# Patient Record
Sex: Female | Born: 1965 | Race: White | Hispanic: No | Marital: Single | Smoking: Former smoker
Health system: Southern US, Community
[De-identification: ages and names within clinical notes are randomized; demographics above are authoritative.]

## PROBLEM LIST (undated history)

## (undated) DIAGNOSIS — A059 Bacterial foodborne intoxication, unspecified: Secondary | ICD-10-CM

## (undated) DIAGNOSIS — F329 Major depressive disorder, single episode, unspecified: Secondary | ICD-10-CM

## (undated) DIAGNOSIS — Z8489 Family history of other specified conditions: Secondary | ICD-10-CM

## (undated) DIAGNOSIS — G709 Myoneural disorder, unspecified: Secondary | ICD-10-CM

## (undated) DIAGNOSIS — F32A Depression, unspecified: Secondary | ICD-10-CM

## (undated) DIAGNOSIS — Z9989 Dependence on other enabling machines and devices: Secondary | ICD-10-CM

## (undated) DIAGNOSIS — K449 Diaphragmatic hernia without obstruction or gangrene: Secondary | ICD-10-CM

## (undated) DIAGNOSIS — M1990 Unspecified osteoarthritis, unspecified site: Secondary | ICD-10-CM

## (undated) DIAGNOSIS — R51 Headache: Secondary | ICD-10-CM

## (undated) DIAGNOSIS — G8929 Other chronic pain: Secondary | ICD-10-CM

## (undated) DIAGNOSIS — I10 Essential (primary) hypertension: Secondary | ICD-10-CM

## (undated) DIAGNOSIS — M810 Age-related osteoporosis without current pathological fracture: Secondary | ICD-10-CM

## (undated) DIAGNOSIS — K219 Gastro-esophageal reflux disease without esophagitis: Secondary | ICD-10-CM

## (undated) DIAGNOSIS — F419 Anxiety disorder, unspecified: Secondary | ICD-10-CM

## (undated) DIAGNOSIS — G894 Chronic pain syndrome: Secondary | ICD-10-CM

## (undated) DIAGNOSIS — H547 Unspecified visual loss: Secondary | ICD-10-CM

## (undated) DIAGNOSIS — IMO0001 Reserved for inherently not codable concepts without codable children: Secondary | ICD-10-CM

## (undated) DIAGNOSIS — R519 Headache, unspecified: Secondary | ICD-10-CM

## (undated) DIAGNOSIS — G43909 Migraine, unspecified, not intractable, without status migrainosus: Secondary | ICD-10-CM

## (undated) DIAGNOSIS — G4730 Sleep apnea, unspecified: Secondary | ICD-10-CM

## (undated) DIAGNOSIS — J45909 Unspecified asthma, uncomplicated: Secondary | ICD-10-CM

## (undated) DIAGNOSIS — H9190 Unspecified hearing loss, unspecified ear: Secondary | ICD-10-CM

## (undated) DIAGNOSIS — M169 Osteoarthritis of hip, unspecified: Secondary | ICD-10-CM

## (undated) HISTORY — DX: Unspecified hearing loss, unspecified ear: H91.90

## (undated) HISTORY — DX: Unspecified visual loss: H54.7

## (undated) HISTORY — DX: Migraine, unspecified, not intractable, without status migrainosus: G43.909

## (undated) HISTORY — DX: Other chronic pain: G89.29

## (undated) HISTORY — DX: Age-related osteoporosis without current pathological fracture: M81.0

## (undated) HISTORY — PX: DILATION AND CURETTAGE OF UTERUS: SHX78

## (undated) HISTORY — DX: Gastro-esophageal reflux disease without esophagitis: K21.9

## (undated) HISTORY — PX: INNER EAR SURGERY: SHX679

## (undated) HISTORY — DX: Chronic pain syndrome: G89.4

## (undated) HISTORY — DX: Dependence on other enabling machines and devices: Z99.89

## (undated) HISTORY — DX: Diaphragmatic hernia without obstruction or gangrene: K44.9

## (undated) HISTORY — DX: Bacterial foodborne intoxication, unspecified: A05.9

## (undated) HISTORY — DX: Osteoarthritis of hip, unspecified: M16.9

## (undated) HISTORY — DX: Reserved for inherently not codable concepts without codable children: IMO0001

---

## 2011-12-12 ENCOUNTER — Emergency Department: Payer: Self-pay | Admitting: Emergency Medicine

## 2012-03-18 ENCOUNTER — Emergency Department: Payer: Self-pay | Admitting: Emergency Medicine

## 2012-03-18 LAB — COMPREHENSIVE METABOLIC PANEL
Alkaline Phosphatase: 86 U/L (ref 50–136)
Chloride: 106 mmol/L (ref 98–107)
Co2: 27 mmol/L (ref 21–32)
Creatinine: 0.86 mg/dL (ref 0.60–1.30)
Glucose: 101 mg/dL — ABNORMAL HIGH (ref 65–99)
SGPT (ALT): 30 U/L (ref 12–78)
Total Protein: 7 g/dL (ref 6.4–8.2)

## 2012-03-18 LAB — URINALYSIS, COMPLETE
Bilirubin,UR: NEGATIVE
Ketone: NEGATIVE
Ph: 8 (ref 4.5–8.0)
Protein: 100
Specific Gravity: 1.016 (ref 1.003–1.030)
Squamous Epithelial: 5

## 2012-03-18 LAB — CBC
MCHC: 34 g/dL (ref 32.0–36.0)
MCV: 84 fL (ref 80–100)
RBC: 4.63 10*6/uL (ref 3.80–5.20)
RDW: 15.1 % — ABNORMAL HIGH (ref 11.5–14.5)
WBC: 6.3 10*3/uL (ref 3.6–11.0)

## 2012-03-18 LAB — PREGNANCY, URINE: Pregnancy Test, Urine: NEGATIVE m[IU]/mL

## 2012-06-18 ENCOUNTER — Emergency Department: Payer: Self-pay | Admitting: Emergency Medicine

## 2012-07-20 DIAGNOSIS — J45909 Unspecified asthma, uncomplicated: Secondary | ICD-10-CM | POA: Insufficient documentation

## 2012-08-29 DIAGNOSIS — M2550 Pain in unspecified joint: Secondary | ICD-10-CM | POA: Insufficient documentation

## 2012-09-15 ENCOUNTER — Ambulatory Visit: Payer: Self-pay | Admitting: Family Medicine

## 2013-03-25 ENCOUNTER — Ambulatory Visit: Payer: Self-pay | Admitting: Family Medicine

## 2013-03-30 ENCOUNTER — Ambulatory Visit: Payer: Self-pay | Admitting: Family Medicine

## 2013-09-08 DIAGNOSIS — F329 Major depressive disorder, single episode, unspecified: Secondary | ICD-10-CM | POA: Insufficient documentation

## 2013-09-08 DIAGNOSIS — F419 Anxiety disorder, unspecified: Secondary | ICD-10-CM | POA: Insufficient documentation

## 2013-09-14 ENCOUNTER — Ambulatory Visit: Payer: Self-pay | Admitting: Gastroenterology

## 2013-09-16 LAB — PATHOLOGY REPORT

## 2013-11-09 DIAGNOSIS — D126 Benign neoplasm of colon, unspecified: Secondary | ICD-10-CM | POA: Insufficient documentation

## 2013-12-06 ENCOUNTER — Ambulatory Visit: Payer: Self-pay | Admitting: Family Medicine

## 2014-01-31 ENCOUNTER — Ambulatory Visit: Payer: Self-pay | Admitting: Gastroenterology

## 2014-06-01 ENCOUNTER — Ambulatory Visit: Payer: Self-pay | Admitting: Nurse Practitioner

## 2014-06-30 ENCOUNTER — Ambulatory Visit: Payer: Self-pay | Admitting: Nurse Practitioner

## 2014-07-27 DIAGNOSIS — H9190 Unspecified hearing loss, unspecified ear: Secondary | ICD-10-CM | POA: Insufficient documentation

## 2014-10-11 ENCOUNTER — Other Ambulatory Visit: Payer: Self-pay | Admitting: Otolaryngology

## 2014-10-11 DIAGNOSIS — E079 Disorder of thyroid, unspecified: Secondary | ICD-10-CM

## 2014-10-14 ENCOUNTER — Ambulatory Visit
Admission: RE | Admit: 2014-10-14 | Discharge: 2014-10-14 | Disposition: A | Discharge status: Home / Self Care | Payer: Medicare Other | Source: Ambulatory Visit | Attending: Otolaryngology | Admitting: Otolaryngology

## 2014-10-14 DIAGNOSIS — E079 Disorder of thyroid, unspecified: Secondary | ICD-10-CM

## 2014-10-14 DIAGNOSIS — E042 Nontoxic multinodular goiter: Secondary | ICD-10-CM | POA: Diagnosis present

## 2014-10-21 ENCOUNTER — Other Ambulatory Visit: Payer: Self-pay | Admitting: Otolaryngology

## 2014-10-21 DIAGNOSIS — J320 Chronic maxillary sinusitis: Secondary | ICD-10-CM

## 2014-10-21 DIAGNOSIS — R42 Dizziness and giddiness: Secondary | ICD-10-CM

## 2014-10-21 DIAGNOSIS — E041 Nontoxic single thyroid nodule: Secondary | ICD-10-CM

## 2014-10-28 ENCOUNTER — Ambulatory Visit: Admission: RE | Admit: 2014-10-28 | Payer: Medicare Other | Source: Ambulatory Visit

## 2014-11-02 ENCOUNTER — Other Ambulatory Visit: Payer: Self-pay | Admitting: Radiology

## 2014-11-03 ENCOUNTER — Ambulatory Visit
Admission: RE | Admit: 2014-11-03 | Discharge: 2014-11-03 | Disposition: A | Discharge status: Home / Self Care | Payer: Medicare Other | Source: Ambulatory Visit | Attending: Otolaryngology | Admitting: Otolaryngology

## 2014-11-03 DIAGNOSIS — J320 Chronic maxillary sinusitis: Secondary | ICD-10-CM

## 2014-11-03 DIAGNOSIS — R42 Dizziness and giddiness: Secondary | ICD-10-CM

## 2014-11-03 DIAGNOSIS — R51 Headache: Secondary | ICD-10-CM | POA: Diagnosis present

## 2014-11-03 DIAGNOSIS — E041 Nontoxic single thyroid nodule: Secondary | ICD-10-CM

## 2014-11-03 HISTORY — DX: Essential (primary) hypertension: I10

## 2014-11-03 HISTORY — DX: Unspecified asthma, uncomplicated: J45.909

## 2014-11-03 HISTORY — DX: Headache: R51

## 2014-11-03 HISTORY — DX: Unspecified osteoarthritis, unspecified site: M19.90

## 2014-11-03 HISTORY — DX: Sleep apnea, unspecified: G47.30

## 2014-11-03 HISTORY — DX: Anxiety disorder, unspecified: F41.9

## 2014-11-03 HISTORY — DX: Headache, unspecified: R51.9

## 2014-11-03 HISTORY — DX: Reserved for inherently not codable concepts without codable children: IMO0001

## 2014-11-03 HISTORY — DX: Depression, unspecified: F32.A

## 2014-11-03 HISTORY — DX: Major depressive disorder, single episode, unspecified: F32.9

## 2014-11-03 HISTORY — DX: Gastro-esophageal reflux disease without esophagitis: K21.9

## 2014-11-03 HISTORY — DX: Myoneural disorder, unspecified: G70.9

## 2014-11-03 MED ORDER — GADOBENATE DIMEGLUMINE 529 MG/ML IV SOLN
20.0000 mL | Freq: Once | INTRAVENOUS | Status: AC | PRN
  Administered 2014-11-03: 20 mL via INTRAVENOUS

## 2014-11-03 NOTE — Procedures (Signed)
Procedure:  Ultrasound guided left thyroid biopsy Findings:  2 cm left thyroid nodule FNA biopsy.  25 G needle passes x 6. No complications. EBL < 10 mL  Genavieve Mangiapane T. Kathlene Cote, M.D Pager:  [phone]

## 2014-11-03 NOTE — Discharge Instructions (Addendum)
°Thyroid Biopsy °The thyroid gland is a butterfly-shaped gland situated in the front of the neck. It produces hormones which affect metabolism, growth and development, and body temperature. A thyroid biopsy is a procedure in which small samples of tissue or fluid are removed from the thyroid gland or mass and examined under a microscope. This test is done to determine the cause of thyroid problems, such as infection, cancer, or other thyroid problems. °There are 2 ways to obtain samples: °1. Fine needle biopsy. Samples are removed using a thin needle inserted through the skin and into the thyroid gland or mass. °2. Open biopsy. Samples are removed after a cut (incision) is made through the skin. °LET YOUR CAREGIVER KNOW ABOUT:  °· Allergies. °· Medications taken including herbs, eye drops, over-the-counter medications, and creams. °· Use of steroids (by mouth or creams). °· Previous problems with anesthetics or numbing medicine. °· Possibility of pregnancy, if this applies. °· History of blood clots (thrombophlebitis). °· History of bleeding or blood problems. °· Previous surgery. °· Other health problems. °RISKS AND COMPLICATIONS °· Bleeding from the site. The risk of bleeding is higher if you have a bleeding disorder or are taking any blood thinning medications (anticoagulants). °· Infection. °· Injury to structures near the thyroid gland. °BEFORE THE PROCEDURE  °This is a procedure that can be done as an outpatient. Confirm the time that you need to arrive for your procedure. Confirm whether there is a need to fast or withhold any medications. A blood sample may be done to determine your blood clotting time. Medicine may be given to help you relax (sedative). °PROCEDURE °Fine needle biopsy. °You will be awake during the procedure. You may be asked to lie on your back with your head tipped backward to extend your neck. Let your caregiver know if you cannot tolerate the positioning. An area on your neck will be  cleansed. A needle is inserted through the skin of your neck. You may feel a mild discomfort during this procedure. You may be asked to avoid coughing, talking, swallowing, or making sounds during some portions of the procedure. The needle is withdrawn once tissue or fluid samples have been removed. Pressure may be applied to the neck to reduce swelling and ensure that bleeding has stopped. The samples will be sent for examination.  °Open biopsy. °You will be given general anesthesia. You will be asleep during the procedure. An incision is made in your neck. A sample of thyroid tissue or the mass is removed. The tissue sample or mass will be sent for examination. The sample or mass may be examined during the biopsy. If the sample or mass contains cancer cells, some or all of the thyroid gland may be removed. The incision is closed with stitches. °AFTER THE PROCEDURE  °Your recovery will be assessed and monitored. If there are no problems, as an outpatient, you should be able to go home shortly after the procedure. °If you had a fine needle biopsy: °· You may have soreness at the biopsy site for 1 to 2 days. °If you had an open biopsy:  °· You may have soreness at the biopsy site for 3 to 4 days. °· You may have a hoarse voice or sore throat for 1 to 2 days. °Obtaining the Test Results °It is your responsibility to obtain your test results. Do not assume everything is normal if you have not heard from your caregiver or the medical facility. It is important for you to follow up   on all of your test results. °HOME CARE INSTRUCTIONS  °· Keeping your head raised on a pillow when you are lying down may ease biopsy site discomfort. °· Supporting the back of your head and neck with both hands as you sit up from a lying position may ease biopsy site discomfort. °· Only take over-the-counter or prescription medicines for pain, discomfort, or fever as directed by your caregiver. °· Throat lozenges or gargling with warm salt  water may help to soothe a sore throat. °SEEK IMMEDIATE MEDICAL CARE IF:  °· You have severe bleeding from the biopsy site. °· You have difficulty swallowing. °· You have a fever. °· You have increased pain, swelling, redness, or warmth at the biopsy site. °· You notice pus coming from the biopsy site. °· You have swollen glands (lymph nodes) in your neck. °Document Released: 03/24/2007 Document Revised: 09/21/2012 Document Reviewed: 08/19/2013 °[employer]® Patient Information ©2015 [employer]. This information is not intended to replace advice given to you by your health care provider. Make sure you discuss any questions you have with your health care provider. ° °Thyroid Biopsy °The thyroid gland is a butterfly-shaped gland situated in the front of the neck. It produces hormones which affect metabolism, growth and development, and body temperature. A thyroid biopsy is a procedure in which small samples of tissue or fluid are removed from the thyroid gland or mass and examined under a microscope. This test is done to determine the cause of thyroid problems, such as infection, cancer, or other thyroid problems. °There are 2 ways to obtain samples: °3. Fine needle biopsy. Samples are removed using a thin needle inserted through the skin and into the thyroid gland or mass. °4. Open biopsy. Samples are removed after a cut (incision) is made through the skin. °LET YOUR CAREGIVER KNOW ABOUT:  °· Allergies. °· Medications taken including herbs, eye drops, over-the-counter medications, and creams. °· Use of steroids (by mouth or creams). °· Previous problems with anesthetics or numbing medicine. °· Possibility of pregnancy, if this applies. °· History of blood clots (thrombophlebitis). °· History of bleeding or blood problems. °· Previous surgery. °· Other health problems. °RISKS AND COMPLICATIONS °· Bleeding from the site. The risk of bleeding is higher if you have a bleeding disorder or are taking any blood thinning  medications (anticoagulants). °· Infection. °· Injury to structures near the thyroid gland. °BEFORE THE PROCEDURE  °This is a procedure that can be done as an outpatient. Confirm the time that you need to arrive for your procedure. Confirm whether there is a need to fast or withhold any medications. A blood sample may be done to determine your blood clotting time. Medicine may be given to help you relax (sedative). °PROCEDURE °Fine needle biopsy. °You will be awake during the procedure. You may be asked to lie on your back with your head tipped backward to extend your neck. Let your caregiver know if you cannot tolerate the positioning. An area on your neck will be cleansed. A needle is inserted through the skin of your neck. You may feel a mild discomfort during this procedure. You may be asked to avoid coughing, talking, swallowing, or making sounds during some portions of the procedure. The needle is withdrawn once tissue or fluid samples have been removed. Pressure may be applied to the neck to reduce swelling and ensure that bleeding has stopped. The samples will be sent for examination.  °Open biopsy. °You will be given general anesthesia. You will be asleep during   the procedure. An incision is made in your neck. A sample of thyroid tissue or the mass is removed. The tissue sample or mass will be sent for examination. The sample or mass may be examined during the biopsy. If the sample or mass contains cancer cells, some or all of the thyroid gland may be removed. The incision is closed with stitches. °AFTER THE PROCEDURE  °Your recovery will be assessed and monitored. If there are no problems, as an outpatient, you should be able to go home shortly after the procedure. °If you had a fine needle biopsy: °· You may have soreness at the biopsy site for 1 to 2 days. °If you had an open biopsy:  °· You may have soreness at the biopsy site for 3 to 4 days. °· You may have a hoarse voice or sore throat for 1 to 2  days. °Obtaining the Test Results °It is your responsibility to obtain your test results. Do not assume everything is normal if you have not heard from your caregiver or the medical facility. It is important for you to follow up on all of your test results. °HOME CARE INSTRUCTIONS  °· Keeping your head raised on a pillow when you are lying down may ease biopsy site discomfort. °· Supporting the back of your head and neck with both hands as you sit up from a lying position may ease biopsy site discomfort. °· Only take over-the-counter or prescription medicines for pain, discomfort, or fever as directed by your caregiver. °· Throat lozenges or gargling with warm salt water may help to soothe a sore throat. °SEEK IMMEDIATE MEDICAL CARE IF:  °· You have severe bleeding from the biopsy site. °· You have difficulty swallowing. °· You have a fever. °· You have increased pain, swelling, redness, or warmth at the biopsy site. °· You notice pus coming from the biopsy site. °· You have swollen glands (lymph nodes) in your neck. °Document Released: 03/24/2007 Document Revised: 09/21/2012 Document Reviewed: 08/19/2013 °[employer]® Patient Information ©2015 [employer]. This information is not intended to replace advice given to you by your health care provider. Make sure you discuss any questions you have with your health care provider. ° °

## 2014-11-03 NOTE — OR Nursing (Signed)
Pt discharged via wheelchair, discharge instructions complete, ice pack to neck. Band aid intact.

## 2014-11-09 LAB — CYTOLOGY - NON PAP

## 2014-11-17 ENCOUNTER — Other Ambulatory Visit: Payer: Self-pay | Admitting: Otolaryngology

## 2014-11-17 DIAGNOSIS — E041 Nontoxic single thyroid nodule: Secondary | ICD-10-CM

## 2014-12-14 DIAGNOSIS — I10 Essential (primary) hypertension: Secondary | ICD-10-CM | POA: Insufficient documentation

## 2014-12-14 DIAGNOSIS — R079 Chest pain, unspecified: Secondary | ICD-10-CM | POA: Insufficient documentation

## 2014-12-14 DIAGNOSIS — R0602 Shortness of breath: Secondary | ICD-10-CM | POA: Insufficient documentation

## 2015-03-06 ENCOUNTER — Other Ambulatory Visit: Payer: Self-pay | Admitting: Student

## 2015-03-06 DIAGNOSIS — R1011 Right upper quadrant pain: Secondary | ICD-10-CM

## 2015-03-06 DIAGNOSIS — K824 Cholesterolosis of gallbladder: Secondary | ICD-10-CM

## 2015-03-09 ENCOUNTER — Ambulatory Visit
Admission: RE | Admit: 2015-03-09 | Discharge: 2015-03-09 | Disposition: A | Discharge status: Home / Self Care | Payer: Medicare Other | Source: Ambulatory Visit | Attending: Student | Admitting: Student

## 2015-03-09 DIAGNOSIS — R1011 Right upper quadrant pain: Secondary | ICD-10-CM | POA: Insufficient documentation

## 2015-03-09 DIAGNOSIS — K824 Cholesterolosis of gallbladder: Secondary | ICD-10-CM | POA: Diagnosis present

## 2015-04-10 ENCOUNTER — Encounter: Payer: Self-pay | Admitting: Pain Medicine

## 2015-04-10 ENCOUNTER — Ambulatory Visit: Payer: Medicare Other | Attending: Pain Medicine | Admitting: Pain Medicine

## 2015-04-10 ENCOUNTER — Other Ambulatory Visit: Payer: Self-pay | Admitting: Pain Medicine

## 2015-04-10 VITALS — BP 151/78 | HR 86 | Temp 98.2°F | Resp 20 | Ht 67.0 in | Wt 285.0 lb

## 2015-04-10 DIAGNOSIS — M25562 Pain in left knee: Secondary | ICD-10-CM | POA: Diagnosis not present

## 2015-04-10 DIAGNOSIS — M5416 Radiculopathy, lumbar region: Secondary | ICD-10-CM

## 2015-04-10 DIAGNOSIS — M797 Fibromyalgia: Secondary | ICD-10-CM | POA: Diagnosis not present

## 2015-04-10 DIAGNOSIS — R32 Unspecified urinary incontinence: Secondary | ICD-10-CM | POA: Diagnosis not present

## 2015-04-10 DIAGNOSIS — M545 Low back pain, unspecified: Secondary | ICD-10-CM

## 2015-04-10 DIAGNOSIS — M5442 Lumbago with sciatica, left side: Secondary | ICD-10-CM | POA: Insufficient documentation

## 2015-04-10 DIAGNOSIS — M25569 Pain in unspecified knee: Secondary | ICD-10-CM | POA: Insufficient documentation

## 2015-04-10 DIAGNOSIS — M47812 Spondylosis without myelopathy or radiculopathy, cervical region: Secondary | ICD-10-CM

## 2015-04-10 DIAGNOSIS — G8929 Other chronic pain: Secondary | ICD-10-CM

## 2015-04-10 DIAGNOSIS — K219 Gastro-esophageal reflux disease without esophagitis: Secondary | ICD-10-CM

## 2015-04-10 DIAGNOSIS — K449 Diaphragmatic hernia without obstruction or gangrene: Secondary | ICD-10-CM | POA: Diagnosis not present

## 2015-04-10 DIAGNOSIS — M5382 Other specified dorsopathies, cervical region: Secondary | ICD-10-CM

## 2015-04-10 DIAGNOSIS — J45909 Unspecified asthma, uncomplicated: Secondary | ICD-10-CM | POA: Insufficient documentation

## 2015-04-10 DIAGNOSIS — Z87820 Personal history of traumatic brain injury: Secondary | ICD-10-CM

## 2015-04-10 DIAGNOSIS — M47816 Spondylosis without myelopathy or radiculopathy, lumbar region: Secondary | ICD-10-CM | POA: Insufficient documentation

## 2015-04-10 DIAGNOSIS — E785 Hyperlipidemia, unspecified: Secondary | ICD-10-CM | POA: Diagnosis not present

## 2015-04-10 DIAGNOSIS — G4733 Obstructive sleep apnea (adult) (pediatric): Secondary | ICD-10-CM | POA: Diagnosis not present

## 2015-04-10 DIAGNOSIS — Z8711 Personal history of peptic ulcer disease: Secondary | ICD-10-CM | POA: Diagnosis not present

## 2015-04-10 DIAGNOSIS — J4520 Mild intermittent asthma, uncomplicated: Secondary | ICD-10-CM | POA: Diagnosis not present

## 2015-04-10 DIAGNOSIS — Z79891 Long term (current) use of opiate analgesic: Secondary | ICD-10-CM | POA: Diagnosis not present

## 2015-04-10 DIAGNOSIS — F1190 Opioid use, unspecified, uncomplicated: Secondary | ICD-10-CM | POA: Diagnosis not present

## 2015-04-10 DIAGNOSIS — M549 Dorsalgia, unspecified: Secondary | ICD-10-CM | POA: Diagnosis present

## 2015-04-10 DIAGNOSIS — Z79899 Other long term (current) drug therapy: Secondary | ICD-10-CM

## 2015-04-10 DIAGNOSIS — Z87891 Personal history of nicotine dependence: Secondary | ICD-10-CM | POA: Diagnosis not present

## 2015-04-10 DIAGNOSIS — F411 Generalized anxiety disorder: Secondary | ICD-10-CM

## 2015-04-10 DIAGNOSIS — Z5181 Encounter for therapeutic drug level monitoring: Secondary | ICD-10-CM

## 2015-04-10 DIAGNOSIS — E559 Vitamin D deficiency, unspecified: Secondary | ICD-10-CM | POA: Diagnosis not present

## 2015-04-10 DIAGNOSIS — M25552 Pain in left hip: Secondary | ICD-10-CM

## 2015-04-10 DIAGNOSIS — M542 Cervicalgia: Secondary | ICD-10-CM | POA: Diagnosis not present

## 2015-04-10 DIAGNOSIS — Z8669 Personal history of other diseases of the nervous system and sense organs: Secondary | ICD-10-CM | POA: Insufficient documentation

## 2015-04-10 DIAGNOSIS — R52 Pain, unspecified: Secondary | ICD-10-CM

## 2015-04-10 DIAGNOSIS — M4726 Other spondylosis with radiculopathy, lumbar region: Secondary | ICD-10-CM

## 2015-04-10 DIAGNOSIS — E7800 Pure hypercholesterolemia, unspecified: Secondary | ICD-10-CM | POA: Insufficient documentation

## 2015-04-10 DIAGNOSIS — F1120 Opioid dependence, uncomplicated: Secondary | ICD-10-CM

## 2015-04-10 DIAGNOSIS — M1612 Unilateral primary osteoarthritis, left hip: Secondary | ICD-10-CM | POA: Diagnosis not present

## 2015-04-10 DIAGNOSIS — I10 Essential (primary) hypertension: Secondary | ICD-10-CM | POA: Insufficient documentation

## 2015-04-10 DIAGNOSIS — G894 Chronic pain syndrome: Secondary | ICD-10-CM

## 2015-04-10 DIAGNOSIS — M169 Osteoarthritis of hip, unspecified: Secondary | ICD-10-CM

## 2015-04-10 HISTORY — DX: Diaphragmatic hernia without obstruction or gangrene: K44.9

## 2015-04-10 HISTORY — DX: Osteoarthritis of hip, unspecified: M16.9

## 2015-04-10 HISTORY — DX: Chronic pain syndrome: G89.4

## 2015-04-10 MED ORDER — OXYCODONE HCL 5 MG PO TABS: 5.0000 mg | ORAL_TABLET | Freq: Four times a day (QID) | ORAL | Status: DC | PRN

## 2015-04-10 MED ORDER — CYCLOBENZAPRINE HCL 10 MG PO TABS: 10.0000 mg | ORAL_TABLET | Freq: Two times a day (BID) | ORAL | Status: DC

## 2015-04-10 MED ORDER — GABAPENTIN 300 MG PO CAPS: 300.0000 mg | ORAL_CAPSULE | Freq: Three times a day (TID) | ORAL | Status: DC

## 2015-04-10 NOTE — Progress Notes (Signed)
Safety precautions to be maintained throughout the outpatient stay will include: orient to surroundings, keep bed in low position, maintain call bell within reach at all times, provide assistance with transfer out of bed and ambulation. Oxycodone pill count # 49/120

## 2015-04-10 NOTE — Patient Instructions (Signed)
GENERAL RISKS AND COMPLICATIONS  What are the risk, side effects and possible complications? Generally speaking, most procedures are safe.  However, with any procedure there are risks, side effects, and the possibility of complications.  The risks and complications are dependent upon the sites that are lesioned, or the type of nerve block to be performed.  The closer the procedure is to the spine, the more serious the risks are.  Great care is taken when placing the radio frequency needles, block needles or lesioning probes, but sometimes complications can occur. 1. Infection: Any time there is an injection through the skin, there is a risk of infection.  This is why sterile conditions are used for these blocks.  There are four possible types of infection. 1. Localized skin infection. 2. Central Nervous System Infection-This can be in the form of Meningitis, which can be deadly. 3. Epidural Infections-This can be in the form of an epidural abscess, which can cause pressure inside of the spine, causing compression of the spinal cord with subsequent paralysis. This would require an emergency surgery to decompress, and there are no guarantees that the patient would recover from the paralysis. 4. Discitis-This is an infection of the intervertebral discs.  It occurs in about 1% of discography procedures.  It is difficult to treat and it may lead to surgery.        2. Pain: the needles have to go through skin and soft tissues, will cause soreness.       3. Damage to internal structures:  The nerves to be lesioned may be near blood vessels or    other nerves which can be potentially damaged.       4. Bleeding: Bleeding is more common if the patient is taking blood thinners such as  aspirin, Coumadin, Ticiid, Plavix, etc., or if he/she have some genetic predisposition  such as hemophilia. Bleeding into the spinal canal can cause compression of the spinal  cord with subsequent paralysis.  This would require an  emergency surgery to  decompress and there are no guarantees that the patient would recover from the  paralysis.       5. Pneumothorax:  Puncturing of a lung is a possibility, every time a needle is introduced in  the area of the chest or upper back.  Pneumothorax refers to free air around the  collapsed lung(s), inside of the thoracic cavity (chest cavity).  Another two possible  complications related to a similar event would include: Hemothorax and Chylothorax.   These are variations of the Pneumothorax, where instead of air around the collapsed  lung(s), you may have blood or chyle, respectively.       6. Spinal headaches: They may occur with any procedures in the area of the spine.       7. Persistent CSF (Cerebro-Spinal Fluid) leakage: This is a rare problem, but may occur  with prolonged intrathecal or epidural catheters either due to the formation of a fistulous  track or a dural tear.       8. Nerve damage: By working so close to the spinal cord, there is always a possibility of  nerve damage, which could be as serious as a permanent spinal cord injury with  paralysis.       9. Death:  Although rare, severe deadly allergic reactions known as "Anaphylactic  reaction" can occur to any of the medications used.      10. Worsening of the symptoms:  We can always make thing worse.    What are the chances of something like this happening? Chances of any of this occuring are extremely low.  By statistics, you have more of a chance of getting killed in a motor vehicle accident: while driving to the hospital than any of the above occurring .  Nevertheless, you should be aware that they are possibilities.  In general, it is similar to taking a shower.  Everybody knows that you can slip, hit your head and get killed.  Does that mean that you should not shower again?  Nevertheless always keep in mind that statistics do not mean anything if you happen to be on the wrong side of them.  Even if a procedure has a 1  (one) in a 1,000,000 (million) chance of going wrong, it you happen to be that one..Also, keep in mind that by statistics, you have more of a chance of having something go wrong when taking medications.  Who should not have this procedure? If you are on a blood thinning medication (e.g. Coumadin, Plavix, see list of "Blood Thinners"), or if you have an active infection going on, you should not have the procedure.  If you are taking any blood thinners, please inform your physician.  How should I prepare for this procedure?  Do not eat or drink anything at least six hours prior to the procedure.  Bring a driver with you .  It cannot be a taxi.  Come accompanied by an adult that can drive you back, and that is strong enough to help you if your legs get weak or numb from the local anesthetic.  Take all of your medicines the morning of the procedure with just enough water to swallow them.  If you have diabetes, make sure that you are scheduled to have your procedure done first thing in the morning, whenever possible.  If you have diabetes, take only half of your insulin dose and notify our nurse that you have done so as soon as you arrive at the clinic.  If you are diabetic, but only take blood sugar pills (oral hypoglycemic), then do not take them on the morning of your procedure.  You may take them after you have had the procedure.  Do not take aspirin or any aspirin-containing medications, at least eleven (11) days prior to the procedure.  They may prolong bleeding.  Wear loose fitting clothing that may be easy to take off and that you would not mind if it got stained with Betadine or blood.  Do not wear any jewelry or perfume  Remove any nail coloring.  It will interfere with some of our monitoring equipment.  NOTE: Remember that this is not meant to be interpreted as a complete list of all possible complications.  Unforeseen problems may occur.  BLOOD THINNERS The following drugs  contain aspirin or other products, which can cause increased bleeding during surgery and should not be taken for 2 weeks prior to and 1 week after surgery.  If you should need take something for relief of minor pain, you may take acetaminophen which is found in Tylenol,m Datril, Anacin-3 and Panadol. It is not blood thinner. The products listed below are.  Do not take any of the products listed below in addition to any listed on your instruction sheet.  A.P.C or A.P.C with Codeine Codeine Phosphate Capsules #3 Ibuprofen Ridaura  ABC compound Congesprin Imuran rimadil  Advil Cope Indocin Robaxisal  Alka-Seltzer Effervescent Pain Reliever and Antacid Coricidin or Coricidin-D  Indomethacin Rufen    Alka-Seltzer plus Cold Medicine Cosprin Ketoprofen S-A-C Tablets  Anacin Analgesic Tablets or Capsules Coumadin Korlgesic Salflex  Anacin Extra Strength Analgesic tablets or capsules CP-2 Tablets Lanoril Salicylate  Anaprox Cuprimine Capsules Levenox Salocol  Anexsia-D Dalteparin Magan Salsalate  Anodynos Darvon compound Magnesium Salicylate Sine-off  Ansaid Dasin Capsules Magsal Sodium Salicylate  Anturane Depen Capsules Marnal Soma  APF Arthritis pain formula Dewitt's Pills Measurin Stanback  Argesic Dia-Gesic Meclofenamic Sulfinpyrazone  Arthritis Bayer Timed Release Aspirin Diclofenac Meclomen Sulindac  Arthritis pain formula Anacin Dicumarol Medipren Supac  Analgesic (Safety coated) Arthralgen Diffunasal Mefanamic Suprofen  Arthritis Strength Bufferin Dihydrocodeine Mepro Compound Suprol  Arthropan liquid Dopirydamole Methcarbomol with Aspirin Synalgos  ASA tablets/Enseals Disalcid Micrainin Tagament  Ascriptin Doan's Midol Talwin  Ascriptin A/D Dolene Mobidin Tanderil  Ascriptin Extra Strength Dolobid Moblgesic Ticlid  Ascriptin with Codeine Doloprin or Doloprin with Codeine Momentum Tolectin  Asperbuf Duoprin Mono-gesic Trendar  Aspergum Duradyne Motrin or Motrin IB Triminicin  Aspirin  plain, buffered or enteric coated Durasal Myochrisine Trigesic  Aspirin Suppositories Easprin Nalfon Trillsate  Aspirin with Codeine Ecotrin Regular or Extra Strength Naprosyn Uracel  Atromid-S Efficin Naproxen Ursinus  Auranofin Capsules Elmiron Neocylate Vanquish  Axotal Emagrin Norgesic Verin  Azathioprine Empirin or Empirin with Codeine Normiflo Vitamin E  Azolid Emprazil Nuprin Voltaren  Bayer Aspirin plain, buffered or children's or timed BC Tablets or powders Encaprin Orgaran Warfarin Sodium  Buff-a-Comp Enoxaparin Orudis Zorpin  Buff-a-Comp with Codeine Equegesic Os-Cal-Gesic   Buffaprin Excedrin plain, buffered or Extra Strength Oxalid   Bufferin Arthritis Strength Feldene Oxphenbutazone   Bufferin plain or Extra Strength Feldene Capsules Oxycodone with Aspirin   Bufferin with Codeine Fenoprofen Fenoprofen Pabalate or Pabalate-SF   Buffets II Flogesic Panagesic   Buffinol plain or Extra Strength Florinal or Florinal with Codeine Panwarfarin   Buf-Tabs Flurbiprofen Penicillamine   Butalbital Compound Four-way cold tablets Penicillin   Butazolidin Fragmin Pepto-Bismol   Carbenicillin Geminisyn Percodan   Carna Arthritis Reliever Geopen Persantine   Carprofen Gold's salt Persistin   Chloramphenicol Goody's Phenylbutazone   Chloromycetin Haltrain Piroxlcam   Clmetidine heparin Plaquenil   Cllnoril Hyco-pap Ponstel   Clofibrate Hydroxy chloroquine Propoxyphen         Before stopping any of these medications, be sure to consult the physician who ordered them.  Some, such as Coumadin (Warfarin) are ordered to prevent or treat serious conditions such as "deep thrombosis", "pumonary embolisms", and other heart problems.  The amount of time that you may need off of the medication may also vary with the medication and the reason for which you were taking it.  If you are taking any of these medications, please make sure you notify your pain physician before you undergo any  procedures.         Epidural Steroid Injection Patient Information  Description: The epidural space surrounds the nerves as they exit the spinal cord.  In some patients, the nerves can be compressed and inflamed by a bulging disc or a tight spinal canal (spinal stenosis).  By injecting steroids into the epidural space, we can bring irritated nerves into direct contact with a potentially helpful medication.  These steroids act directly on the irritated nerves and can reduce swelling and inflammation which often leads to decreased pain.  Epidural steroids may be injected anywhere along the spine and from the neck to the low back depending upon the location of your pain.   After numbing the skin with local anesthetic (like Novocaine), a small needle is passed   into the epidural space slowly.  You may experience a sensation of pressure while this is being done.  The entire block usually last less than 10 minutes.  Conditions which may be treated by epidural steroids:   Low back and leg pain  Neck and arm pain  Spinal stenosis  Post-laminectomy syndrome  Herpes zoster (shingles) pain  Pain from compression fractures  Preparation for the injection:  1. Do not eat any solid food or dairy products within 6 hours of your appointment.  2. You may drink clear liquids up to 2 hours before appointment.  Clear liquids include water, black coffee, juice or soda.  No milk or cream please. 3. You may take your regular medication, including pain medications, with a sip of water before your appointment  Diabetics should hold regular insulin (if taken separately) and take 1/2 normal NPH dos the morning of the procedure.  Carry some sugar containing items with you to your appointment. 4. A driver must accompany you and be prepared to drive you home after your procedure.  5. Bring all your current medications with your. 6. An IV may be inserted and sedation may be given at the discretion of the  physician.   7. A blood pressure cuff, EKG and other monitors will often be applied during the procedure.  Some patients may need to have extra oxygen administered for a short period. 8. You will be asked to provide medical information, including your allergies, prior to the procedure.  We must know immediately if you are taking blood thinners (like Coumadin/Warfarin)  Or if you are allergic to IV iodine contrast (dye). We must know if you could possible be pregnant.  Possible side-effects:  Bleeding from needle site  Infection (rare, may require surgery)  Nerve injury (rare)  Numbness & tingling (temporary)  Difficulty urinating (rare, temporary)  Spinal headache ( a headache worse with upright posture)  Light -headedness (temporary)  Pain at injection site (several days)  Decreased blood pressure (temporary)  Weakness in arm/leg (temporary)  Pressure sensation in back/neck (temporary)  Call if you experience:  Fever/chills associated with headache or increased back/neck pain.  Headache worsened by an upright position.  New onset weakness or numbness of an extremity below the injection site  Hives or difficulty breathing (go to the emergency room)  Inflammation or drainage at the infection site  Severe back/neck pain  Any new symptoms which are concerning to you  Please note:  Although the local anesthetic injected can often make your back or neck feel good for several hours after the injection, the pain will likely return.  It takes 3-7 days for steroids to work in the epidural space.  You may not notice any pain relief for at least that one week.  If effective, we will often do a series of three injections spaced 3-6 weeks apart to maximally decrease your pain.  After the initial series, we generally will wait several months before considering a repeat injection of the same type.  If you have any questions, please call [phone]  [facility] 

## 2015-04-10 NOTE — Progress Notes (Signed)
Patient's Name: STPEHANIE MONTROY MRN: [id number] DOB: 05-29-1966 DOS: 04/10/2015  Primary Reason(s) for Visit: Encounter for Medication Management. CC: Back Pain and Knee Pain   HPI:   Ms. Hartung is a 49 y.o. year old, female patient, who returns today as an established patient. She has Chest pain; Essential (primary) hypertension; Acid reflux; Breathlessness on exertion; Chronic pain; Long term current use of opiate analgesic; Long term prescription opiate use; Opiate use; Opiate dependence ([place]); Encounter for therapeutic drug level monitoring; Chronic low back pain; Lumbar spondylosis; Chronic radicular lumbar pain (left side); Chronic pain syndrome; Lumbar facet syndrome; Chronic left hip pain; Osteoarthritis of left hip; Chronic neck pain; Cervical spondylosis; Cervical facet syndrome; Chronic knee pain (left side); Generalized pain; Fibromyalgia; Obstructive sleep apnea; Morbid obesity ([place]); Bronchial asthma; History of migraine; Urinary incontinence; History of concussion; Hyperlipidemia; Hiatal hernia; GERD (gastroesophageal reflux disease); History of peptic ulcer; Generalized anxiety disorder; and Vitamin D insufficiency on her problem list.. Her primarily concern today is the Back Pain and Knee Pain    The patient returns today indicating that her low back pain is returning and she would like to have the lumbar epidural steroid injection repeated. She still has more pain on the left side, but the right side is beginning to bother her. Not been able to lose weight and now she is having more problems with her knees. I have talked to her about the possibility of exercise in a swimming pool so that we can take away the usual gravity. I told her to go to the [facility] and check to see if they would wave her feet if I give her a prescription. If they do, however Hulen Skains one for her in the future. Otherwise, she will need to continue working on her weight and exercising as tolerated. If I see that this is not  improving we may need to talk to her and her primary care physicians to see if they can arrange a bariatric consultation. At this point she refers that her back pain is worse, which is likely to be coming from her lumbar spondylosis. Today's Pain Score: 8  Pain Type: Chronic pain Pain Location: Back Pain Orientation: Lower Pain Descriptors / Indicators: Throbbing, Aching, Sharp Pain Frequency: Intermittent  Date of Last Visit: Date of Last Visit: 01/26/15 Service Provided on Last Visit: Service Provided on Last Visit: Med Refill  Pharmacotherapy Review:   Side-effects or Adverse reactions: None reported. Effectiveness: Described as relatively effective, allowing for increase in activities of daily living (ADL). Onset of action: Within expected pharmacological parameters. Duration of action: Within normal limits for medication. Peak effect: Timing and results are as within normal expected parameters. [place] PMP: Compliant with practice rules and regulations. DST: Compliant with practice rules and regulations. Lab work: No new labs ordered by our practice. Treatment compliance: Compliant. Substance Use Disorder (SUD) Risk Level: Low Planned course of action: Continue therapy as is.  Allergies: Ms. Hennen is allergic to sulfa antibiotics.  Meds: The patient has a current medication list which includes the following prescription(s): albuterol, albuterol-ipratropium, cyclobenzaprine, duloxetine, fluticasone, fluticasone-salmeterol, gabapentin, levothyroxine, lisinopril, oxycodone, pantoprazole, solifenacin, trazodone, oxycodone, and oxycodone. Requested Prescriptions   Signed Prescriptions Disp Refills  . oxyCODONE (OXY IR/ROXICODONE) 5 MG immediate release tablet 120 tablet 0    Sig: Take 1 tablet (5 mg total) by mouth every 6 (six) hours as needed for severe pain.  Marland Kitchen gabapentin (NEURONTIN) 300 MG capsule 90 capsule 2    Sig: Take 1 capsule (300 mg  total) by mouth 3 (three) times daily.  .  cyclobenzaprine (FLEXERIL) 10 MG tablet 60 tablet 2    Sig: Take 1 tablet (10 mg total) by mouth 2 (two) times daily.  Marland Kitchen oxyCODONE (OXY IR/ROXICODONE) 5 MG immediate release tablet 120 tablet 0    Sig: Take 1 tablet (5 mg total) by mouth every 6 (six) hours as needed for severe pain.  Marland Kitchen oxyCODONE (OXY IR/ROXICODONE) 5 MG immediate release tablet 120 tablet 0    Sig: Take 1 tablet (5 mg total) by mouth every 6 (six) hours as needed for severe pain.    ROS: Constitutional: Afebrile, no chills, well hydrated and well nourished Gastrointestinal: negative Musculoskeletal:negative Neurological: negative Behavioral/Psych: negative  PFSH: Medical:  Ms. Monaco  has a past medical history of Hypertension; Shortness of breath dyspnea; Sleep apnea; Asthma; Depression; Anxiety; GERD (gastroesophageal reflux disease); Headache; Neuromuscular disorder ([place]); Arthritis; Migraines; Hiatal hernia; Reflux; Osteoporosis; Chronic pain; Hearing loss; and Vision loss. Family: family history includes COPD in her father; Diabetes in her mother; Heart disease in her mother; Kidney disease in her mother. Surgical:  has past surgical history that includes Inner ear surgery (Bilateral) and Dilation and curettage of uterus. Tobacco:  reports that she quit smoking about 17 years ago. She does not have any smokeless tobacco history on file. Alcohol:  reports that she does not drink alcohol. Drug:  reports that she does not use illicit drugs.  Physical Exam: Vitals:  Today's Vitals   04/10/15 1431 04/10/15 1435  BP: 151/78   Pulse: 86   Temp: 98.2 F (36.8 C)   Resp: 20   Height: 5\' 7"  (1.702 m)   Weight: 285 lb (129.275 kg)   SpO2: 96%   PainSc: 8  8   PainLoc: Back   Calculated BMI: Body mass index is 44.63 kg/(m^2). General appearance: alert, cooperative, appears stated age, mild distress and morbidly obese Eyes: conjunctivae/corneas clear. PERRL, EOM's intact. Fundi benign. Lungs: No evidence respiratory  distress, no audible rales or ronchi and no use of accessory muscles of respiration Neck: no adenopathy, no carotid bruit, no JVD, supple, symmetrical, trachea midline and thyroid not enlarged, symmetric, no tenderness/mass/nodules Back: symmetric, no curvature. ROM normal. No CVA tenderness. Extremities: She refers having some bumps over her knees, but upon examining her, I cannot find anything but normal anatomy. Pulses: 2+ and symmetric Skin: Skin color, texture, turgor normal. No rashes or lesions Neurologic: Gait: Antalgic    Assessment: Encounter Diagnosis:  Primary Diagnosis: Chronic pain [G89.29]  Plan: Leani was seen today for back pain and knee pain.  Diagnoses and all orders for this visit:  Chronic pain -     oxyCODONE (OXY IR/ROXICODONE) 5 MG immediate release tablet; Take 1 tablet (5 mg total) by mouth every 6 (six) hours as needed for severe pain. -     gabapentin (NEURONTIN) 300 MG capsule; Take 1 capsule (300 mg total) by mouth 3 (three) times daily. -     cyclobenzaprine (FLEXERIL) 10 MG tablet; Take 1 tablet (10 mg total) by mouth 2 (two) times daily. -     oxyCODONE (OXY IR/ROXICODONE) 5 MG immediate release tablet; Take 1 tablet (5 mg total) by mouth every 6 (six) hours as needed for severe pain. -     oxyCODONE (OXY IR/ROXICODONE) 5 MG immediate release tablet; Take 1 tablet (5 mg total) by mouth every 6 (six) hours as needed for severe pain.  Long term current use of opiate analgesic -  Drugs of abuse screen w/o alc, rtn urine-sln; Future  Long term prescription opiate use  Opiate use  Uncomplicated opioid dependence ([place])  Encounter for therapeutic drug level monitoring  Chronic low back pain  Osteoarthritis of spine with radiculopathy, lumbar region  Chronic radicular lumbar pain -     LUMBAR EPIDURAL STEROID INJECTION; Future  Chronic pain syndrome  Lumbar facet syndrome  Chronic left hip pain  Primary osteoarthritis of left  hip  Chronic neck pain  Cervical spondylosis  Cervical facet syndrome  Chronic knee pain, left  Generalized pain  Fibromyalgia  Obstructive sleep apnea  Morbid obesity due to excess calories ([facility])  Bronchial asthma, mild intermittent, uncomplicated  History of migraine  Urinary incontinence, unspecified incontinence type  History of concussion  Hyperlipidemia  Hiatal hernia  Gastroesophageal reflux disease without esophagitis  History of peptic ulcer  Generalized anxiety disorder  Vitamin D insufficiency     Patient Instructions   GENERAL RISKS AND COMPLICATIONS  What are the risk, side effects and possible complications? Generally speaking, most procedures are safe.  However, with any procedure there are risks, side effects, and the possibility of complications.  The risks and complications are dependent upon the sites that are lesioned, or the type of nerve block to be performed.  The closer the procedure is to the spine, the more serious the risks are.  Great care is taken when placing the radio frequency needles, block needles or lesioning probes, but sometimes complications can occur. 1. Infection: Any time there is an injection through the skin, there is a risk of infection.  This is why sterile conditions are used for these blocks.  There are four possible types of infection. 1. Localized skin infection. 2. Central Nervous System Infection-This can be in the form of Meningitis, which can be deadly. 3. Epidural Infections-This can be in the form of an epidural abscess, which can cause pressure inside of the spine, causing compression of the spinal cord with subsequent paralysis. This would require an emergency surgery to decompress, and there are no guarantees that the patient would recover from the paralysis. 4. Discitis-This is an infection of the intervertebral discs.  It occurs in about 1% of discography procedures.  It is difficult to treat and it may lead  to surgery.        2. Pain: the needles have to go through skin and soft tissues, will cause soreness.       3. Damage to internal structures:  The nerves to be lesioned may be near blood vessels or    other nerves which can be potentially damaged.       4. Bleeding: Bleeding is more common if the patient is taking blood thinners such as  aspirin, Coumadin, Ticiid, Plavix, etc., or if he/she have some genetic predisposition  such as hemophilia. Bleeding into the spinal canal can cause compression of the spinal  cord with subsequent paralysis.  This would require an emergency surgery to  decompress and there are no guarantees that the patient would recover from the  paralysis.       5. Pneumothorax:  Puncturing of a lung is a possibility, every time a needle is introduced in  the area of the chest or upper back.  Pneumothorax refers to free air around the  collapsed lung(s), inside of the thoracic cavity (chest cavity).  Another two possible  complications related to a similar event would include: Hemothorax and Chylothorax.   These are variations of the Pneumothorax,  where instead of air around the collapsed  lung(s), you may have blood or chyle, respectively.       6. Spinal headaches: They may occur with any procedures in the area of the spine.       7. Persistent CSF (Cerebro-Spinal Fluid) leakage: This is a rare problem, but may occur  with prolonged intrathecal or epidural catheters either due to the formation of a fistulous  track or a dural tear.       8. Nerve damage: By working so close to the spinal cord, there is always a possibility of  nerve damage, which could be as serious as a permanent spinal cord injury with  paralysis.       9. Death:  Although rare, severe deadly allergic reactions known as "Anaphylactic  reaction" can occur to any of the medications used.      10. Worsening of the symptoms:  We can always make thing worse.  What are the chances of something like this  happening? Chances of any of this occuring are extremely low.  By statistics, you have more of a chance of getting killed in a motor vehicle accident: while driving to the hospital than any of the above occurring .  Nevertheless, you should be aware that they are possibilities.  In general, it is similar to taking a shower.  Everybody knows that you can slip, hit your head and get killed.  Does that mean that you should not shower again?  Nevertheless always keep in mind that statistics do not mean anything if you happen to be on the wrong side of them.  Even if a procedure has a 1 (one) in a 1,000,000 (million) chance of going wrong, it you happen to be that one..Also, keep in mind that by statistics, you have more of a chance of having something go wrong when taking medications.  Who should not have this procedure? If you are on a blood thinning medication (e.g. Coumadin, Plavix, see list of "Blood Thinners"), or if you have an active infection going on, you should not have the procedure.  If you are taking any blood thinners, please inform your physician.  How should I prepare for this procedure?  Do not eat or drink anything at least six hours prior to the procedure.  Bring a driver with you .  It cannot be a taxi.  Come accompanied by an adult that can drive you back, and that is strong enough to help you if your legs get weak or numb from the local anesthetic.  Take all of your medicines the morning of the procedure with just enough water to swallow them.  If you have diabetes, make sure that you are scheduled to have your procedure done first thing in the morning, whenever possible.  If you have diabetes, take only half of your insulin dose and notify our nurse that you have done so as soon as you arrive at the clinic.  If you are diabetic, but only take blood sugar pills (oral hypoglycemic), then do not take them on the morning of your procedure.  You may take them after you have had the  procedure.  Do not take aspirin or any aspirin-containing medications, at least eleven (11) days prior to the procedure.  They may prolong bleeding.  Wear loose fitting clothing that may be easy to take off and that you would not mind if it got stained with Betadine or blood.  Do not wear any jewelry or  perfume  Remove any nail coloring.  It will interfere with some of our monitoring equipment.  NOTE: Remember that this is not meant to be interpreted as a complete list of all possible complications.  Unforeseen problems may occur.  BLOOD THINNERS The following drugs contain aspirin or other products, which can cause increased bleeding during surgery and should not be taken for 2 weeks prior to and 1 week after surgery.  If you should need take something for relief of minor pain, you may take acetaminophen which is found in Tylenol,m Datril, Anacin-3 and Panadol. It is not blood thinner. The products listed below are.  Do not take any of the products listed below in addition to any listed on your instruction sheet.  A.P.C or A.P.C with Codeine Codeine Phosphate Capsules #3 Ibuprofen Ridaura  ABC compound Congesprin Imuran rimadil  Advil Cope Indocin Robaxisal  Alka-Seltzer Effervescent Pain Reliever and Antacid Coricidin or Coricidin-D  Indomethacin Rufen  Alka-Seltzer plus Cold Medicine Cosprin Ketoprofen S-A-C Tablets  Anacin Analgesic Tablets or Capsules Coumadin Korlgesic Salflex  Anacin Extra Strength Analgesic tablets or capsules CP-2 Tablets Lanoril Salicylate  Anaprox Cuprimine Capsules Levenox Salocol  Anexsia-D Dalteparin Magan Salsalate  Anodynos Darvon compound Magnesium Salicylate Sine-off  Ansaid Dasin Capsules Magsal Sodium Salicylate  Anturane Depen Capsules Marnal Soma  APF Arthritis pain formula Dewitt's Pills Measurin Stanback  Argesic Dia-Gesic Meclofenamic Sulfinpyrazone  Arthritis Bayer Timed Release Aspirin Diclofenac Meclomen Sulindac  Arthritis pain formula  Anacin Dicumarol Medipren Supac  Analgesic (Safety coated) Arthralgen Diffunasal Mefanamic Suprofen  Arthritis Strength Bufferin Dihydrocodeine Mepro Compound Suprol  Arthropan liquid Dopirydamole Methcarbomol with Aspirin Synalgos  ASA tablets/Enseals Disalcid Micrainin Tagament  Ascriptin Doan's Midol Talwin  Ascriptin A/D Dolene Mobidin Tanderil  Ascriptin Extra Strength Dolobid Moblgesic Ticlid  Ascriptin with Codeine Doloprin or Doloprin with Codeine Momentum Tolectin  Asperbuf Duoprin Mono-gesic Trendar  Aspergum Duradyne Motrin or Motrin IB Triminicin  Aspirin plain, buffered or enteric coated Durasal Myochrisine Trigesic  Aspirin Suppositories Easprin Nalfon Trillsate  Aspirin with Codeine Ecotrin Regular or Extra Strength Naprosyn Uracel  Atromid-S Efficin Naproxen Ursinus  Auranofin Capsules Elmiron Neocylate Vanquish  Axotal Emagrin Norgesic Verin  Azathioprine Empirin or Empirin with Codeine Normiflo Vitamin E  Azolid Emprazil Nuprin Voltaren  Bayer Aspirin plain, buffered or children's or timed BC Tablets or powders Encaprin Orgaran Warfarin Sodium  Buff-a-Comp Enoxaparin Orudis Zorpin  Buff-a-Comp with Codeine Equegesic Os-Cal-Gesic   Buffaprin Excedrin plain, buffered or Extra Strength Oxalid   Bufferin Arthritis Strength Feldene Oxphenbutazone   Bufferin plain or Extra Strength Feldene Capsules Oxycodone with Aspirin   Bufferin with Codeine Fenoprofen Fenoprofen Pabalate or Pabalate-SF   Buffets II Flogesic Panagesic   Buffinol plain or Extra Strength Florinal or Florinal with Codeine Panwarfarin   Buf-Tabs Flurbiprofen Penicillamine   Butalbital Compound Four-way cold tablets Penicillin   Butazolidin Fragmin Pepto-Bismol   Carbenicillin Geminisyn Percodan   Carna Arthritis Reliever Geopen Persantine   Carprofen Gold's salt Persistin   Chloramphenicol Goody's Phenylbutazone   Chloromycetin Haltrain Piroxlcam   Clmetidine heparin Plaquenil   Cllnoril Hyco-pap  Ponstel   Clofibrate Hydroxy chloroquine Propoxyphen         Before stopping any of these medications, be sure to consult the physician who ordered them.  Some, such as Coumadin (Warfarin) are ordered to prevent or treat serious conditions such as "deep thrombosis", "pumonary embolisms", and other heart problems.  The amount of time that you may need off of the medication may also vary with the  medication and the reason for which you were taking it.  If you are taking any of these medications, please make sure you notify your pain physician before you undergo any procedures.         Epidural Steroid Injection Patient Information  Description: The epidural space surrounds the nerves as they exit the spinal cord.  In some patients, the nerves can be compressed and inflamed by a bulging disc or a tight spinal canal (spinal stenosis).  By injecting steroids into the epidural space, we can bring irritated nerves into direct contact with a potentially helpful medication.  These steroids act directly on the irritated nerves and can reduce swelling and inflammation which often leads to decreased pain.  Epidural steroids may be injected anywhere along the spine and from the neck to the low back depending upon the location of your pain.   After numbing the skin with local anesthetic (like Novocaine), a small needle is passed into the epidural space slowly.  You may experience a sensation of pressure while this is being done.  The entire block usually last less than 10 minutes.  Conditions which may be treated by epidural steroids:   Low back and leg pain  Neck and arm pain  Spinal stenosis  Post-laminectomy syndrome  Herpes zoster (shingles) pain  Pain from compression fractures  Preparation for the injection:  1. Do not eat any solid food or dairy products within 6 hours of your appointment.  2. You may drink clear liquids up to 2 hours before appointment.  Clear liquids include water,  black coffee, juice or soda.  No milk or cream please. 3. You may take your regular medication, including pain medications, with a sip of water before your appointment  Diabetics should hold regular insulin (if taken separately) and take 1/2 normal NPH dos the morning of the procedure.  Carry some sugar containing items with you to your appointment. 4. A driver must accompany you and be prepared to drive you home after your procedure.  5. Bring all your current medications with your. 6. An IV may be inserted and sedation may be given at the discretion of the physician.   7. A blood pressure cuff, EKG and other monitors will often be applied during the procedure.  Some patients may need to have extra oxygen administered for a short period. 8. You will be asked to provide medical information, including your allergies, prior to the procedure.  We must know immediately if you are taking blood thinners (like Coumadin/Warfarin)  Or if you are allergic to IV iodine contrast (dye). We must know if you could possible be pregnant.  Possible side-effects:  Bleeding from needle site  Infection (rare, may require surgery)  Nerve injury (rare)  Numbness & tingling (temporary)  Difficulty urinating (rare, temporary)  Spinal headache ( a headache worse with upright posture)  Light -headedness (temporary)  Pain at injection site (several days)  Decreased blood pressure (temporary)  Weakness in arm/leg (temporary)  Pressure sensation in back/neck (temporary)  Call if you experience:  Fever/chills associated with headache or increased back/neck pain.  Headache worsened by an upright position.  New onset weakness or numbness of an extremity below the injection site  Hives or difficulty breathing (go to the emergency room)  Inflammation or drainage at the infection site  Severe back/neck pain  Any new symptoms which are concerning to you  Please note:  Although the local anesthetic  injected can often make your back or neck  feel good for several hours after the injection, the pain will likely return.  It takes 3-7 days for steroids to work in the epidural space.  You may not notice any pain relief for at least that one week.  If effective, we will often do a series of three injections spaced 3-6 weeks apart to maximally decrease your pain.  After the initial series, we generally will wait several months before considering a repeat injection of the same type.  If you have any questions, please call [phone] [facility]   Medications discontinued today:  Medications Discontinued During This Encounter  Medication Reason  . clotrimazole (LOTRIMIN) 1 % cream Error  . cyclobenzaprine (FLEXERIL) 5 MG tablet Error  . lisinopril-hydrochlorothiazide (PRINZIDE,ZESTORETIC) 20-25 MG per tablet Error  . naproxen (NAPROSYN) 500 MG tablet Error  . traMADol-acetaminophen (ULTRACET) 37.5-325 MG per tablet Error  . oxyCODONE (OXY IR/ROXICODONE) 5 MG immediate release tablet Reorder  . gabapentin (NEURONTIN) 300 MG capsule Reorder  . cyclobenzaprine (FLEXERIL) 10 MG tablet Reorder   Medications administered today:  Ms. Gunnels had no medications administered during this visit.  Primary Care Physician: WHITE, Arlie Solomons, FNP Location: [facility] Note by: Kathlen Brunswick. Dossie Arbour, M.D, DABA, DABAPM, DABPM, DABIPP, FIPP

## 2015-04-13 ENCOUNTER — Encounter: Payer: Self-pay | Admitting: Pain Medicine

## 2015-04-13 ENCOUNTER — Ambulatory Visit: Payer: Medicare Other | Attending: Pain Medicine | Admitting: Pain Medicine

## 2015-04-13 VITALS — BP 147/80 | HR 86 | Temp 96.9°F | Resp 18 | Ht 67.0 in | Wt 280.0 lb

## 2015-04-13 DIAGNOSIS — G4700 Insomnia, unspecified: Secondary | ICD-10-CM | POA: Insufficient documentation

## 2015-04-13 DIAGNOSIS — M1612 Unilateral primary osteoarthritis, left hip: Secondary | ICD-10-CM | POA: Diagnosis not present

## 2015-04-13 DIAGNOSIS — M797 Fibromyalgia: Secondary | ICD-10-CM | POA: Diagnosis not present

## 2015-04-13 DIAGNOSIS — M4726 Other spondylosis with radiculopathy, lumbar region: Secondary | ICD-10-CM

## 2015-04-13 DIAGNOSIS — F1190 Opioid use, unspecified, uncomplicated: Secondary | ICD-10-CM | POA: Diagnosis not present

## 2015-04-13 DIAGNOSIS — M47812 Spondylosis without myelopathy or radiculopathy, cervical region: Secondary | ICD-10-CM | POA: Insufficient documentation

## 2015-04-13 DIAGNOSIS — M4727 Other spondylosis with radiculopathy, lumbosacral region: Secondary | ICD-10-CM | POA: Insufficient documentation

## 2015-04-13 DIAGNOSIS — E785 Hyperlipidemia, unspecified: Secondary | ICD-10-CM | POA: Diagnosis not present

## 2015-04-13 DIAGNOSIS — G8929 Other chronic pain: Secondary | ICD-10-CM | POA: Insufficient documentation

## 2015-04-13 DIAGNOSIS — K219 Gastro-esophageal reflux disease without esophagitis: Secondary | ICD-10-CM | POA: Insufficient documentation

## 2015-04-13 DIAGNOSIS — J45909 Unspecified asthma, uncomplicated: Secondary | ICD-10-CM | POA: Diagnosis not present

## 2015-04-13 DIAGNOSIS — I10 Essential (primary) hypertension: Secondary | ICD-10-CM | POA: Insufficient documentation

## 2015-04-13 DIAGNOSIS — R252 Cramp and spasm: Secondary | ICD-10-CM | POA: Insufficient documentation

## 2015-04-13 DIAGNOSIS — E559 Vitamin D deficiency, unspecified: Secondary | ICD-10-CM | POA: Insufficient documentation

## 2015-04-13 DIAGNOSIS — F411 Generalized anxiety disorder: Secondary | ICD-10-CM | POA: Insufficient documentation

## 2015-04-13 DIAGNOSIS — M7918 Myalgia, other site: Secondary | ICD-10-CM

## 2015-04-13 DIAGNOSIS — M549 Dorsalgia, unspecified: Secondary | ICD-10-CM | POA: Diagnosis present

## 2015-04-13 DIAGNOSIS — M5416 Radiculopathy, lumbar region: Secondary | ICD-10-CM

## 2015-04-13 MED ORDER — LIDOCAINE HCL (PF) 1 % IJ SOLN
INTRAMUSCULAR | Status: AC
  Administered 2015-04-13: 09:00:00
  Filled 2015-04-13: qty 5

## 2015-04-13 MED ORDER — IOHEXOL 180 MG/ML  SOLN: 10.0000 mL | Freq: Once | INTRAMUSCULAR | Status: DC | PRN

## 2015-04-13 MED ORDER — TRIAMCINOLONE ACETONIDE 40 MG/ML IJ SUSP: 40.0000 mg | Freq: Once | INTRAMUSCULAR | Status: DC

## 2015-04-13 MED ORDER — MELATONIN 10 MG PO CAPS: 20.0000 mg | ORAL_CAPSULE | Freq: Every evening | ORAL | Status: DC | PRN

## 2015-04-13 MED ORDER — SODIUM CHLORIDE 0.9 % IJ SOLN: 2.0000 mL | Freq: Once | INTRAMUSCULAR | Status: DC

## 2015-04-13 MED ORDER — BACLOFEN 10 MG PO TABS: 10.0000 mg | ORAL_TABLET | Freq: Every evening | ORAL | Status: DC | PRN

## 2015-04-13 MED ORDER — TRIAMCINOLONE ACETONIDE 40 MG/ML IJ SUSP
INTRAMUSCULAR | Status: AC
  Administered 2015-04-13: 09:00:00
  Filled 2015-04-13: qty 1

## 2015-04-13 MED ORDER — SODIUM CHLORIDE 0.9 % IJ SOLN
INTRAMUSCULAR | Status: AC
  Administered 2015-04-13: 09:00:00
  Filled 2015-04-13: qty 10

## 2015-04-13 MED ORDER — LIDOCAINE HCL (PF) 1 % IJ SOLN: 10.0000 mL | Freq: Once | INTRAMUSCULAR | Status: DC

## 2015-04-13 MED ORDER — IOHEXOL 180 MG/ML  SOLN
INTRAMUSCULAR | Status: AC
  Filled 2015-04-13: qty 20

## 2015-04-13 MED ORDER — ROPIVACAINE HCL 2 MG/ML IJ SOLN
INTRAMUSCULAR | Status: AC
  Administered 2015-04-13: 09:00:00
  Filled 2015-04-13: qty 10

## 2015-04-13 MED ORDER — ROPIVACAINE HCL 2 MG/ML IJ SOLN: 2.0000 mL | Freq: Once | INTRAMUSCULAR | Status: DC

## 2015-04-13 NOTE — Patient Instructions (Signed)

## 2015-04-13 NOTE — Progress Notes (Signed)
Safety precautions to be maintained throughout the outpatient stay will include: orient to surroundings, keep bed in low position, maintain call bell within reach at all times, provide assistance with transfer out of bed and ambulation.  

## 2015-04-13 NOTE — Progress Notes (Signed)
Patient's Name: Kristina Gomez MRN: [id number] DOB: 1965-09-09 DOS: 04/13/2015  Primary Reason(s) for Visit: Interventional Pain Management Treatment. CC: Back Pain   Pre-Procedure Assessment: Kristina Gomez is a 49 y.o. year old, female patient, seen today for interventional treatment. She has Chest pain; Essential (primary) hypertension; Acid reflux; Breathlessness on exertion; Chronic pain; Long term current use of opiate analgesic; Long term prescription opiate use; Opiate use; Opiate dependence ([place]); Encounter for therapeutic drug level monitoring; Chronic low back pain; Lumbar spondylosis; Chronic radicular lumbar pain (left side); Chronic pain syndrome; Lumbar facet syndrome; Chronic left hip pain; Osteoarthritis of left hip; Chronic neck pain; Cervical spondylosis; Cervical facet syndrome; Chronic knee pain (left side); Generalized pain; Fibromyalgia; Obstructive sleep apnea; Morbid obesity ([place]); Bronchial asthma; History of migraine; Urinary incontinence; History of concussion; Hyperlipidemia; Hiatal hernia; GERD (gastroesophageal reflux disease); History of peptic ulcer; Generalized anxiety disorder; Vitamin D insufficiency; Musculoskeletal pain; Muscle cramps at night (left leg); and Insomnia on her problem list.. Her primarily concern today is the Back Pain Verification of the correct person, correct site (including marking of site), and correct procedure were performed and confirmed by the patient. Today's Vitals   04/13/15 0726 04/13/15 0823  BP: 143/83 147/80  Pulse: 88 86  Temp: 96.9 F (36.1 C)   TempSrc: Oral   Resp: 18 18  Height: '5\' 7"'  (1.702 m)   Weight: 280 lb (127.007 kg)   SpO2: 100% 95%  PainSc: 7  7   Calculated BMI: Body mass index is 43.84 kg/(m^2). Allergies: She is allergic to contrast media and sulfa antibiotics.. Primary Diagnosis: Osteoarthritis of spine with radiculopathy, lumbar region [M47.26]  Procedure: Type: Palliative Inter-Laminar Epidural Steroid  Injection Region: Lumbar Level: L4-5 Level. Laterality: Left-Sided Paramedial  Indications: Spondylosis, Lumbosacral Region  Consent: Secured. Under the influence of no sedatives a written informed consent was obtained, after having provided information on the risks and possible complications. To fulfill our ethical and legal obligations, as recommended by the American Medical Association's Code of Ethics, we have provided information to the patient about our clinical impression; the nature and purpose of the treatment or procedure; the risks, benefits, and possible complications of the intervention; alternatives; the risk(s) and benefit(s) of the alternative treatment(s) or procedure(s); and the risk(s) and benefit(s) of doing nothing. The patient was provided information about the risks and possible complications associated with the procedure. In the case of spinal procedures these may include, but are not limited to, failure to achieve desired goals, infection, bleeding, organ or nerve damage, allergic reactions, paralysis, and death. In addition, the patient was informed that Medicine is not an exact science; therefore, there is also the possibility of unforeseen risks and possible complications that may result in a catastrophic outcome. The patient indicated having understood very clearly. We have given the patient no guarantees and we have made no promises. Enough time was given to the patient to ask questions, all of which were answered to the patient's satisfaction.  Pre-Procedure Preparation: Safety Precautions: Allergies reviewed. Appropriate site, procedure, and patient were confirmed by following the Joint Commission's Universal Protocol (UP.01.01.01), in the form of a "Time Out". The patient was asked to confirm marked site and procedure, before commencing. The patient was asked about blood thinners, or active infections, both of which were denied. Patient was assessed for positional comfort  and all pressure points were checked before starting procedure. Monitoring:  As per clinic protocol. Infection Control Precautions: Sterile technique used. Standard Universal Precautions were taken as recommended  by the Department of [facility] for Disease Control and Prevention (CDC). Standard pre-surgical skin prep was conducted. Respiratory hygiene and cough etiquette was practiced. Hand hygiene observed. Safe injection practices and needle disposal techniques followed. SDV (single dose vial) medications used. Medications properly checked for expiration dates and contaminants. Personal protective equipment (PPE) used: Surgical mask. Sterile double glove technique. Radiation resistant gloves. Sterile surgical gloves.  Anesthesia, Analgesia, Anxiolysis: Type: Local Anesthesia Local Anesthetic(s): Lidocaine 1% Route: Subcutaneous IV Access: Declined. Sedation: Declined. Indication(s):Not applicable.  Description of Procedure Process:  Time-out: "Time-out" completed before starting procedure, as per protocol. Position: Prone Target Area: For Epidural Steroid injections, the target area is the  interlaminar space, initially targeting the lower border of the superior vertebral body lamina. Approach: Posterior approach. Area Prepped: Entire Posterior Lumbosacral Region Prepping solution: ChloraPrep (2% chlorhexidine gluconate and 70% isopropyl alcohol) Safety Precautions: Aspiration looking for blood return was conducted prior to all injections. At no point did we inject any substances, as a needle was being advanced. No attempts were made at seeking any paresthesias. Safe injection practices and needle disposal techniques used. Medications properly checked for expiration dates. SDV (single dose vial) medications used. Description of the Procedure: Protocol guidelines were followed. The patient was placed in position over the fluoroscopy table. The target area was identified and the area  prepped in the usual manner. Skin desensitized using vapocoolant spray. Skin & deeper tissues infiltrated with local anesthetic. Appropriate amount of time allowed to pass for local anesthetics to take effect. The procedure needle was introduced through the skin, ipsilateral to the reported pain, and advanced to the target area. Bone was contacted and the needle walked caudad, until the lamina was cleared. The epidural space was identified using "loss-of-resistance technique" with 2-3 ml of PF-NaCl (0.9% NSS), in a 5cc LOR glass syringe. Proper needle placement secured. Negative aspiration confirmed. Solution injected in intermittent fashion, asking for systemic symptoms every 0.5cc of injectate. The needles were then removed and the area cleansed, making sure to leave some of the prepping solution back to take advantage of its long term bactericidal properties. EBL: None Materials & Medications Used:  Needle(s) Used: 20g - 10cm, Tuohy-style epidural needle Medications Administered today: We administered lidocaine (PF), sodium chloride, ropivacaine (PF) 2 mg/ml (0.2%), and triamcinolone acetonide.Please see chart orders for dosing details.  Imaging Guidance:  Type of Imaging Technique: Fluoroscopy Guidance (Spinal) Indication(s): Assistance in needle guidance and placement for procedures requiring needle placement in or near specific anatomical locations not easily accessible without such assistance. Exposure Time: Please see nurses notes. Contrast: Patient allergic to contrast dye. None used. Fluoroscopic Guidance: I was personally present in the fluoroscopy suite, where the patient was placed in position for the procedure, over the fluoroscopy-compatible table. Fluoroscopy was manipulated, using "Tunnel Vision Technique", to obtain the best possible view of the target area, on the affected side. Parallax error was corrected before commencing the procedure. A "direction-depth-direction" technique was  used to introduce the needle under continuous pulsed fluoroscopic guidance. Once the target was reached, antero-posterior, oblique, and lateral fluoroscopic projection views were taken to confirm needle placement in all planes. Permanently recorded images stored by scanning into EMR. Interpretation: Intraoperative imaging interpretation by performing Physician. Adequate needle placement confirmed. Adequate needle placement confirmed in AP, lateral, & Oblique Views. No contrast injected. Permanent hardcopy images in multiple planes scanned into the patient's record.  Antibiotics:  Type:  Antibiotics Given (last 72 hours)    None  Indication(s): No indications identified.  Post-operative Assessment:  Complications: No immediate post-treatment complications were observed. Relevant Post-operative Information:  Disposition: Return to clinic for follow-up evaluation. The patient tolerated the entire procedure well. A repeat set of vitals were taken after the procedure and the patient was kept under observation following institutional policy, for this procedure. Post-procedural neurological assessment was performed, showing return to baseline, prior to discharge. The patient was discharged home, once institutional criteria were met. The patient was provided with post-procedure discharge instructions, including a section on how to identify potential problems. Should any problems arise concerning this procedure, the patient was given instructions to immediately contact us, at any time, without hesitation. In any case, we plan to contact the patient by telephone for a follow-up status report regarding this interventional procedure. Comments:  No additional relevant information.  Primary Care Physician: WHITE, Arlie Solomons, FNP Location: [facility] Outpatient Pain Management Facility Note by: Kathlen Brunswick. Dossie Arbour, M.D, DABA, DABAPM, DABPM, DABIPP, FIPP  Disclaimer:  Medicine is not an exact science. The only  guarantee in medicine is that nothing is guaranteed. It is important to note that the decision to proceed with this intervention was based on the information collected from the patient. The Data and conclusions were drawn from the patient's questionnaire, the interview, and the physical examination. Because the information was provided in large part by the patient, it cannot be guaranteed that it has not been purposely or unconsciously manipulated. Every effort has been made to obtain as much relevant data as possible for this evaluation. It is important to note that the conclusions that lead to this procedure are derived in large part from the available data. Always take into account that the treatment will also be dependent on availability of resources and existing treatment guidelines, considered by other Pain Management Practitioners as being common knowledge and practice, at the time of the intervention. For Medico-Legal purposes, it is also important to point out that variation in procedural techniques and pharmacological choices are the acceptable norm. The indications, contraindications, technique, and results of the above procedure should only be interpreted and judged by a Board-Certified Interventional Pain Specialist with extensive familiarity and expertise in the same exact procedure and technique. Attempts at providing opinions without similar or greater experience and expertise than that of the treating physician will be considered as inappropriate and unethical, and shall result in a formal complaint to the state medical board and applicable specialty societies.

## 2015-04-14 ENCOUNTER — Telehealth: Payer: Self-pay

## 2015-04-14 NOTE — Telephone Encounter (Signed)
Left message

## 2015-04-15 LAB — TOXASSURE SELECT 13 (MW), URINE: PDF: 0

## 2015-04-26 ENCOUNTER — Ambulatory Visit: Payer: Medicare Other | Admitting: Pain Medicine

## 2015-05-01 ENCOUNTER — Other Ambulatory Visit: Payer: Self-pay | Admitting: Pain Medicine

## 2015-05-08 ENCOUNTER — Encounter: Payer: Self-pay | Admitting: Pain Medicine

## 2015-05-08 ENCOUNTER — Ambulatory Visit: Payer: Medicare Other | Attending: Pain Medicine | Admitting: Pain Medicine

## 2015-05-08 ENCOUNTER — Other Ambulatory Visit: Payer: Self-pay | Admitting: Pain Medicine

## 2015-05-08 VITALS — BP 98/37 | HR 82 | Temp 98.2°F | Resp 18 | Ht 67.0 in | Wt 270.0 lb

## 2015-05-08 DIAGNOSIS — Z79891 Long term (current) use of opiate analgesic: Secondary | ICD-10-CM

## 2015-05-08 DIAGNOSIS — F419 Anxiety disorder, unspecified: Secondary | ICD-10-CM | POA: Insufficient documentation

## 2015-05-08 DIAGNOSIS — M797 Fibromyalgia: Secondary | ICD-10-CM | POA: Diagnosis not present

## 2015-05-08 DIAGNOSIS — F329 Major depressive disorder, single episode, unspecified: Secondary | ICD-10-CM | POA: Diagnosis not present

## 2015-05-08 DIAGNOSIS — Z91041 Radiographic dye allergy status: Secondary | ICD-10-CM | POA: Insufficient documentation

## 2015-05-08 DIAGNOSIS — M25559 Pain in unspecified hip: Secondary | ICD-10-CM | POA: Diagnosis present

## 2015-05-08 DIAGNOSIS — G43909 Migraine, unspecified, not intractable, without status migrainosus: Secondary | ICD-10-CM | POA: Diagnosis not present

## 2015-05-08 DIAGNOSIS — T7411XA Adult physical abuse, confirmed, initial encounter: Secondary | ICD-10-CM | POA: Insufficient documentation

## 2015-05-08 DIAGNOSIS — K219 Gastro-esophageal reflux disease without esophagitis: Secondary | ICD-10-CM | POA: Insufficient documentation

## 2015-05-08 DIAGNOSIS — J45909 Unspecified asthma, uncomplicated: Secondary | ICD-10-CM | POA: Diagnosis not present

## 2015-05-08 DIAGNOSIS — Z8711 Personal history of peptic ulcer disease: Secondary | ICD-10-CM | POA: Diagnosis not present

## 2015-05-08 DIAGNOSIS — M150 Primary generalized (osteo)arthritis: Secondary | ICD-10-CM

## 2015-05-08 DIAGNOSIS — F1190 Opioid use, unspecified, uncomplicated: Secondary | ICD-10-CM | POA: Insufficient documentation

## 2015-05-08 DIAGNOSIS — M8580 Other specified disorders of bone density and structure, unspecified site: Secondary | ICD-10-CM | POA: Diagnosis not present

## 2015-05-08 DIAGNOSIS — Z87891 Personal history of nicotine dependence: Secondary | ICD-10-CM | POA: Insufficient documentation

## 2015-05-08 DIAGNOSIS — M1712 Unilateral primary osteoarthritis, left knee: Secondary | ICD-10-CM | POA: Diagnosis not present

## 2015-05-08 DIAGNOSIS — E559 Vitamin D deficiency, unspecified: Secondary | ICD-10-CM

## 2015-05-08 DIAGNOSIS — G894 Chronic pain syndrome: Secondary | ICD-10-CM | POA: Diagnosis not present

## 2015-05-08 DIAGNOSIS — F1120 Opioid dependence, uncomplicated: Secondary | ICD-10-CM

## 2015-05-08 DIAGNOSIS — M542 Cervicalgia: Secondary | ICD-10-CM | POA: Diagnosis not present

## 2015-05-08 DIAGNOSIS — G8929 Other chronic pain: Secondary | ICD-10-CM | POA: Insufficient documentation

## 2015-05-08 DIAGNOSIS — E785 Hyperlipidemia, unspecified: Secondary | ICD-10-CM | POA: Diagnosis not present

## 2015-05-08 DIAGNOSIS — I10 Essential (primary) hypertension: Secondary | ICD-10-CM | POA: Insufficient documentation

## 2015-05-08 DIAGNOSIS — G4730 Sleep apnea, unspecified: Secondary | ICD-10-CM | POA: Diagnosis not present

## 2015-05-08 DIAGNOSIS — M1990 Unspecified osteoarthritis, unspecified site: Secondary | ICD-10-CM | POA: Insufficient documentation

## 2015-05-08 DIAGNOSIS — Z5181 Encounter for therapeutic drug level monitoring: Secondary | ICD-10-CM

## 2015-05-08 DIAGNOSIS — M159 Polyosteoarthritis, unspecified: Secondary | ICD-10-CM

## 2015-05-08 DIAGNOSIS — M5416 Radiculopathy, lumbar region: Secondary | ICD-10-CM | POA: Diagnosis not present

## 2015-05-08 DIAGNOSIS — Z79899 Other long term (current) drug therapy: Secondary | ICD-10-CM

## 2015-05-08 MED ORDER — METHYLPREDNISOLONE 4 MG PO TBPK: ORAL_TABLET | ORAL | Status: DC

## 2015-05-08 NOTE — Progress Notes (Signed)
Oxycodone pill count #37
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
## 2015-05-13 LAB — TOXASSURE SELECT 13 (MW), URINE: PDF: 0

## 2015-05-15 ENCOUNTER — Ambulatory Visit: Payer: Medicare Other

## 2015-05-26 ENCOUNTER — Other Ambulatory Visit: Payer: Self-pay | Admitting: Otolaryngology

## 2015-05-26 DIAGNOSIS — E041 Nontoxic single thyroid nodule: Secondary | ICD-10-CM

## 2015-06-01 ENCOUNTER — Ambulatory Visit: Payer: Medicare Other

## 2015-06-01 ENCOUNTER — Ambulatory Visit
Admission: RE | Admit: 2015-06-01 | Discharge: 2015-06-01 | Disposition: A | Discharge status: Home / Self Care | Payer: Medicare Other | Source: Ambulatory Visit | Attending: Otolaryngology | Admitting: Otolaryngology

## 2015-06-01 DIAGNOSIS — E041 Nontoxic single thyroid nodule: Secondary | ICD-10-CM

## 2015-06-01 DIAGNOSIS — E042 Nontoxic multinodular goiter: Secondary | ICD-10-CM | POA: Insufficient documentation

## 2015-06-07 ENCOUNTER — Other Ambulatory Visit
Admission: RE | Admit: 2015-06-07 | Discharge: 2015-06-07 | Disposition: A | Discharge status: Home / Self Care | Payer: Medicare Other | Source: Ambulatory Visit | Attending: Pain Medicine | Admitting: Pain Medicine

## 2015-06-07 ENCOUNTER — Encounter: Payer: Self-pay | Admitting: Pain Medicine

## 2015-06-07 ENCOUNTER — Other Ambulatory Visit: Payer: Self-pay | Admitting: Pain Medicine

## 2015-06-07 ENCOUNTER — Ambulatory Visit
Admission: RE | Admit: 2015-06-07 | Discharge: 2015-06-07 | Disposition: A | Discharge status: Home / Self Care | Payer: Medicare Other | Source: Ambulatory Visit | Attending: Pain Medicine | Admitting: Pain Medicine

## 2015-06-07 ENCOUNTER — Ambulatory Visit (HOSPITAL_BASED_OUTPATIENT_CLINIC_OR_DEPARTMENT_OTHER): Payer: Medicare Other | Admitting: Pain Medicine

## 2015-06-07 VITALS — BP 138/71 | HR 84 | Temp 98.6°F | Resp 18 | Ht 67.0 in | Wt 270.0 lb

## 2015-06-07 DIAGNOSIS — M797 Fibromyalgia: Secondary | ICD-10-CM | POA: Diagnosis not present

## 2015-06-07 DIAGNOSIS — E559 Vitamin D deficiency, unspecified: Secondary | ICD-10-CM | POA: Diagnosis not present

## 2015-06-07 DIAGNOSIS — J45909 Unspecified asthma, uncomplicated: Secondary | ICD-10-CM | POA: Diagnosis not present

## 2015-06-07 DIAGNOSIS — G4733 Obstructive sleep apnea (adult) (pediatric): Secondary | ICD-10-CM | POA: Diagnosis not present

## 2015-06-07 DIAGNOSIS — Z79891 Long term (current) use of opiate analgesic: Secondary | ICD-10-CM

## 2015-06-07 DIAGNOSIS — M1612 Unilateral primary osteoarthritis, left hip: Secondary | ICD-10-CM | POA: Insufficient documentation

## 2015-06-07 DIAGNOSIS — M542 Cervicalgia: Secondary | ICD-10-CM | POA: Diagnosis not present

## 2015-06-07 DIAGNOSIS — Z87891 Personal history of nicotine dependence: Secondary | ICD-10-CM | POA: Insufficient documentation

## 2015-06-07 DIAGNOSIS — K449 Diaphragmatic hernia without obstruction or gangrene: Secondary | ICD-10-CM | POA: Diagnosis not present

## 2015-06-07 DIAGNOSIS — Z5181 Encounter for therapeutic drug level monitoring: Secondary | ICD-10-CM

## 2015-06-07 DIAGNOSIS — F17200 Nicotine dependence, unspecified, uncomplicated: Secondary | ICD-10-CM | POA: Diagnosis not present

## 2015-06-07 DIAGNOSIS — I10 Essential (primary) hypertension: Secondary | ICD-10-CM | POA: Diagnosis not present

## 2015-06-07 DIAGNOSIS — G43909 Migraine, unspecified, not intractable, without status migrainosus: Secondary | ICD-10-CM | POA: Diagnosis not present

## 2015-06-07 DIAGNOSIS — R32 Unspecified urinary incontinence: Secondary | ICD-10-CM | POA: Insufficient documentation

## 2015-06-07 DIAGNOSIS — G8929 Other chronic pain: Secondary | ICD-10-CM | POA: Insufficient documentation

## 2015-06-07 DIAGNOSIS — M545 Low back pain, unspecified: Secondary | ICD-10-CM

## 2015-06-07 DIAGNOSIS — E785 Hyperlipidemia, unspecified: Secondary | ICD-10-CM | POA: Diagnosis not present

## 2015-06-07 DIAGNOSIS — K219 Gastro-esophageal reflux disease without esophagitis: Secondary | ICD-10-CM | POA: Diagnosis not present

## 2015-06-07 DIAGNOSIS — F1190 Opioid use, unspecified, uncomplicated: Secondary | ICD-10-CM | POA: Diagnosis not present

## 2015-06-07 LAB — COMPREHENSIVE METABOLIC PANEL
ALK PHOS: 64 U/L (ref 38–126)
ALT: 32 U/L (ref 14–54)
AST: 21 U/L (ref 15–41)
Albumin: 3.6 g/dL (ref 3.5–5.0)
Anion gap: 7 (ref 5–15)
BILIRUBIN TOTAL: 0.5 mg/dL (ref 0.3–1.2)
BUN: 15 mg/dL (ref 6–20)
CALCIUM: 10.1 mg/dL (ref 8.9–10.3)
CO2: 29 mmol/L (ref 22–32)
CREATININE: 0.86 mg/dL (ref 0.44–1.00)
Chloride: 105 mmol/L (ref 101–111)
Glucose, Bld: 109 mg/dL — ABNORMAL HIGH (ref 65–99)
Potassium: 3.4 mmol/L — ABNORMAL LOW (ref 3.5–5.1)
Sodium: 141 mmol/L (ref 135–145)
Total Protein: 6.5 g/dL (ref 6.5–8.1)

## 2015-06-07 LAB — SEDIMENTATION RATE: Sed Rate: 24 mm/hr (ref 0–30)

## 2015-06-07 LAB — MAGNESIUM: MAGNESIUM: 2 mg/dL (ref 1.7–2.4)

## 2015-06-07 LAB — VITAMIN B12: Vitamin B-12: 173 pg/mL — ABNORMAL LOW (ref 180–914)

## 2015-06-07 NOTE — Progress Notes (Signed)
Safety precautions to be maintained throughout the outpatient stay will include: orient to surroundings, keep bed in low position, maintain call bell within reach at all times, provide assistance with transfer out of bed and ambulation. Oxycodone pill count # 84

## 2015-06-07 NOTE — Patient Instructions (Signed)
Instructed to go get labwork and xrays at [facility]
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
## 2015-06-08 ENCOUNTER — Other Ambulatory Visit: Payer: Self-pay | Admitting: Pain Medicine

## 2015-06-08 DIAGNOSIS — E559 Vitamin D deficiency, unspecified: Secondary | ICD-10-CM

## 2015-06-08 LAB — C-REACTIVE PROTEIN

## 2015-06-08 LAB — VITAMIN D 25 HYDROXY (VIT D DEFICIENCY, FRACTURES): VIT D 25 HYDROXY: 21.6 ng/mL — AB (ref 30.0–100.0)

## 2015-06-08 MED ORDER — VITAMIN D (ERGOCALCIFEROL) 1.25 MG (50000 UNIT) PO CAPS: 50000.0000 [IU] | ORAL_CAPSULE | ORAL | Status: AC

## 2015-06-08 MED ORDER — VITAMIN D3 50 MCG (2000 UT) PO CAPS: 2000.0000 [IU] | ORAL_CAPSULE | Freq: Every day | ORAL | Status: DC

## 2015-06-08 NOTE — Progress Notes (Signed)

## 2015-06-08 NOTE — Progress Notes (Signed)
Quick Note:  No evidence of kidney stones found. ______

## 2015-06-13 LAB — TOXASSURE SELECT 13 (MW), URINE: PDF: 0

## 2015-07-26 ENCOUNTER — Ambulatory Visit: Payer: Medicare Other | Admitting: Pain Medicine

## 2015-08-23 ENCOUNTER — Encounter: Payer: Self-pay | Admitting: Pain Medicine

## 2015-08-23 ENCOUNTER — Ambulatory Visit: Payer: Medicare Other | Attending: Pain Medicine | Admitting: Pain Medicine

## 2015-08-23 VITALS — BP 151/85 | HR 105 | Temp 98.2°F | Resp 18 | Ht 67.0 in | Wt 300.0 lb

## 2015-08-23 DIAGNOSIS — Z79891 Long term (current) use of opiate analgesic: Secondary | ICD-10-CM | POA: Diagnosis not present

## 2015-08-23 DIAGNOSIS — Z87891 Personal history of nicotine dependence: Secondary | ICD-10-CM | POA: Diagnosis not present

## 2015-08-23 DIAGNOSIS — E559 Vitamin D deficiency, unspecified: Secondary | ICD-10-CM | POA: Diagnosis not present

## 2015-08-23 DIAGNOSIS — M549 Dorsalgia, unspecified: Secondary | ICD-10-CM | POA: Diagnosis present

## 2015-08-23 DIAGNOSIS — I10 Essential (primary) hypertension: Secondary | ICD-10-CM | POA: Insufficient documentation

## 2015-08-23 DIAGNOSIS — K219 Gastro-esophageal reflux disease without esophagitis: Secondary | ICD-10-CM | POA: Diagnosis not present

## 2015-08-23 DIAGNOSIS — M797 Fibromyalgia: Secondary | ICD-10-CM | POA: Insufficient documentation

## 2015-08-23 DIAGNOSIS — G43909 Migraine, unspecified, not intractable, without status migrainosus: Secondary | ICD-10-CM | POA: Insufficient documentation

## 2015-08-23 DIAGNOSIS — M25552 Pain in left hip: Secondary | ICD-10-CM | POA: Insufficient documentation

## 2015-08-23 DIAGNOSIS — M47812 Spondylosis without myelopathy or radiculopathy, cervical region: Secondary | ICD-10-CM | POA: Insufficient documentation

## 2015-08-23 DIAGNOSIS — J45909 Unspecified asthma, uncomplicated: Secondary | ICD-10-CM | POA: Insufficient documentation

## 2015-08-23 DIAGNOSIS — K449 Diaphragmatic hernia without obstruction or gangrene: Secondary | ICD-10-CM | POA: Insufficient documentation

## 2015-08-23 DIAGNOSIS — M810 Age-related osteoporosis without current pathological fracture: Secondary | ICD-10-CM | POA: Insufficient documentation

## 2015-08-23 DIAGNOSIS — M545 Low back pain: Secondary | ICD-10-CM | POA: Insufficient documentation

## 2015-08-23 DIAGNOSIS — M1712 Unilateral primary osteoarthritis, left knee: Secondary | ICD-10-CM | POA: Insufficient documentation

## 2015-08-23 DIAGNOSIS — M1990 Unspecified osteoarthritis, unspecified site: Secondary | ICD-10-CM | POA: Insufficient documentation

## 2015-08-23 DIAGNOSIS — F411 Generalized anxiety disorder: Secondary | ICD-10-CM | POA: Insufficient documentation

## 2015-08-23 DIAGNOSIS — E785 Hyperlipidemia, unspecified: Secondary | ICD-10-CM | POA: Insufficient documentation

## 2015-08-23 DIAGNOSIS — G8929 Other chronic pain: Secondary | ICD-10-CM | POA: Diagnosis not present

## 2015-08-23 DIAGNOSIS — M542 Cervicalgia: Secondary | ICD-10-CM | POA: Diagnosis not present

## 2015-08-23 DIAGNOSIS — E538 Deficiency of other specified B group vitamins: Secondary | ICD-10-CM | POA: Diagnosis not present

## 2015-08-23 DIAGNOSIS — R252 Cramp and spasm: Secondary | ICD-10-CM | POA: Diagnosis not present

## 2015-08-23 DIAGNOSIS — Z5181 Encounter for therapeutic drug level monitoring: Secondary | ICD-10-CM

## 2015-08-23 DIAGNOSIS — G4733 Obstructive sleep apnea (adult) (pediatric): Secondary | ICD-10-CM | POA: Insufficient documentation

## 2015-08-23 DIAGNOSIS — R079 Chest pain, unspecified: Secondary | ICD-10-CM | POA: Insufficient documentation

## 2015-08-23 MED ORDER — GABAPENTIN 300 MG PO CAPS: 300.0000 mg | ORAL_CAPSULE | Freq: Three times a day (TID) | ORAL | Status: DC

## 2015-08-23 MED ORDER — BACLOFEN 10 MG PO TABS: 10.0000 mg | ORAL_TABLET | Freq: Every evening | ORAL | Status: DC | PRN

## 2015-08-23 MED ORDER — OXYCODONE HCL 5 MG PO TABS: 5.0000 mg | ORAL_TABLET | Freq: Four times a day (QID) | ORAL | Status: DC | PRN

## 2015-08-23 MED ORDER — CYANOCOBALAMIN 1500 MCG PO TBDP: 1.0000 | ORAL_TABLET | Freq: Every day | ORAL | Status: DC

## 2015-08-23 MED ORDER — VITAMIN D3 50 MCG (2000 UT) PO CAPS: 2000.0000 [IU] | ORAL_CAPSULE | Freq: Every day | ORAL | Status: DC

## 2015-08-23 NOTE — Progress Notes (Signed)
Pill count: Oxycodone #0 Coughing up phlem (green and yellow), wheezing, shortness of breath since February 12th. Seen by PCP, started on Augmentin, then Levaquin, and has been called in another medication at this time.
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
## 2015-08-27 ENCOUNTER — Encounter: Payer: Self-pay | Admitting: *Deleted

## 2015-08-27 ENCOUNTER — Emergency Department: Payer: Medicare Other

## 2015-08-27 ENCOUNTER — Emergency Department
Admission: EM | Admit: 2015-08-27 | Discharge: 2015-08-27 | Disposition: A | Discharge status: Home / Self Care | Payer: Medicare Other | Attending: Emergency Medicine | Admitting: Emergency Medicine

## 2015-08-27 DIAGNOSIS — M810 Age-related osteoporosis without current pathological fracture: Secondary | ICD-10-CM | POA: Diagnosis not present

## 2015-08-27 DIAGNOSIS — Z87891 Personal history of nicotine dependence: Secondary | ICD-10-CM | POA: Insufficient documentation

## 2015-08-27 DIAGNOSIS — J45909 Unspecified asthma, uncomplicated: Secondary | ICD-10-CM | POA: Insufficient documentation

## 2015-08-27 DIAGNOSIS — I10 Essential (primary) hypertension: Secondary | ICD-10-CM | POA: Diagnosis not present

## 2015-08-27 DIAGNOSIS — Z79899 Other long term (current) drug therapy: Secondary | ICD-10-CM | POA: Insufficient documentation

## 2015-08-27 DIAGNOSIS — J209 Acute bronchitis, unspecified: Secondary | ICD-10-CM | POA: Insufficient documentation

## 2015-08-27 DIAGNOSIS — R05 Cough: Secondary | ICD-10-CM | POA: Diagnosis present

## 2015-08-27 DIAGNOSIS — E785 Hyperlipidemia, unspecified: Secondary | ICD-10-CM | POA: Insufficient documentation

## 2015-08-27 DIAGNOSIS — F329 Major depressive disorder, single episode, unspecified: Secondary | ICD-10-CM | POA: Diagnosis not present

## 2015-08-27 DIAGNOSIS — G894 Chronic pain syndrome: Secondary | ICD-10-CM | POA: Insufficient documentation

## 2015-08-27 LAB — CBC WITH DIFFERENTIAL/PLATELET
BASOS PCT: 1 %
Basophils Absolute: 0 10*3/uL (ref 0–0.1)
EOS ABS: 0 10*3/uL (ref 0–0.7)
Eosinophils Relative: 1 %
HCT: 39.4 % (ref 35.0–47.0)
Hemoglobin: 13.6 g/dL (ref 12.0–16.0)
Lymphocytes Relative: 24 %
Lymphs Abs: 1.4 10*3/uL (ref 1.0–3.6)
MCH: 29.3 pg (ref 26.0–34.0)
MCHC: 34.6 g/dL (ref 32.0–36.0)
MCV: 84.6 fL (ref 80.0–100.0)
MONO ABS: 0.9 10*3/uL (ref 0.2–0.9)
MONOS PCT: 15 %
Neutro Abs: 3.6 10*3/uL (ref 1.4–6.5)
Neutrophils Relative %: 59 %
Platelets: 190 10*3/uL (ref 150–440)
RBC: 4.65 MIL/uL (ref 3.80–5.20)
RDW: 15.6 % — AB (ref 11.5–14.5)
WBC: 6.1 10*3/uL (ref 3.6–11.0)

## 2015-08-27 LAB — COMPREHENSIVE METABOLIC PANEL
ALBUMIN: 3.7 g/dL (ref 3.5–5.0)
ALK PHOS: 77 U/L (ref 38–126)
ALT: 64 U/L — ABNORMAL HIGH (ref 14–54)
ANION GAP: 6 (ref 5–15)
AST: 37 U/L (ref 15–41)
BILIRUBIN TOTAL: 0.4 mg/dL (ref 0.3–1.2)
BUN: 18 mg/dL (ref 6–20)
CALCIUM: 9.5 mg/dL (ref 8.9–10.3)
CO2: 28 mmol/L (ref 22–32)
Chloride: 103 mmol/L (ref 101–111)
Creatinine, Ser: 0.85 mg/dL (ref 0.44–1.00)
GFR calc Af Amer: >60 mL/min (ref >60)
GLUCOSE: 106 mg/dL — AB (ref 65–99)
POTASSIUM: 3.1 mmol/L — AB (ref 3.5–5.1)
Sodium: 137 mmol/L (ref 135–145)
TOTAL PROTEIN: 6.7 g/dL (ref 6.5–8.1)

## 2015-08-27 LAB — BRAIN NATRIURETIC PEPTIDE: B NATRIURETIC PEPTIDE 5: 5 pg/mL (ref 0.0–100.0)

## 2015-08-27 MED ORDER — PREDNISONE 20 MG PO TABS: 40.0000 mg | ORAL_TABLET | Freq: Every day | ORAL | Status: AC

## 2015-08-27 MED ORDER — FUROSEMIDE 20 MG PO TABS: 20.0000 mg | ORAL_TABLET | Freq: Every day | ORAL | Status: AC

## 2015-08-27 NOTE — ED Notes (Addendum)
Patient c/o cough, body aches, weakness since February. Patient has tested negative for the flu. Patient states cough is persistent. Patient has had two different antibiotic, mucinex and steroids and denies relief.
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
## 2015-08-27 NOTE — Discharge Instructions (Signed)
Acute Bronchitis Bronchitis is inflammation of the airways that extend from the windpipe into the lungs (bronchi). The inflammation often causes mucus to develop. This leads to a cough, which is the most common symptom of bronchitis.  In acute bronchitis, the condition usually develops suddenly and goes away over time, usually in a couple weeks. Smoking, allergies, and asthma can make bronchitis worse. Repeated episodes of bronchitis may cause further lung problems.  CAUSES Acute bronchitis is most often caused by the same virus that causes a cold. The virus can spread from person to person (contagious) through coughing, sneezing, and touching contaminated objects. SIGNS AND SYMPTOMS   Cough.   Fever.   Coughing up mucus.   Body aches.   Chest congestion.   Chills.   Shortness of breath.   Sore throat.  DIAGNOSIS  Acute bronchitis is usually diagnosed through a physical exam. Your health care provider will also ask you questions about your medical history. Tests, such as chest X-rays, are sometimes done to rule out other conditions.  TREATMENT  Acute bronchitis usually goes away in a couple weeks. Oftentimes, no medical treatment is necessary. Medicines are sometimes given for relief of fever or cough. Antibiotic medicines are usually not needed but may be prescribed in certain situations. In some cases, an inhaler may be recommended to help reduce shortness of breath and control the cough. A cool mist vaporizer may also be used to help thin bronchial secretions and make it easier to clear the chest.  HOME CARE INSTRUCTIONS  Get plenty of rest.   Drink enough fluids to keep your urine clear or pale yellow (unless you have a medical condition that requires fluid restriction). Increasing fluids may help thin your respiratory secretions (sputum) and reduce chest congestion, and it will prevent dehydration.   Take medicines only as directed by your health care provider.  If  you were prescribed an antibiotic medicine, finish it all even if you start to feel better.  Avoid smoking and secondhand smoke. Exposure to cigarette smoke or irritating chemicals will make bronchitis worse. If you are a smoker, consider using nicotine gum or skin patches to help control withdrawal symptoms. Quitting smoking will help your lungs heal faster.   Reduce the chances of another bout of acute bronchitis by washing your hands frequently, avoiding people with cold symptoms, and trying not to touch your hands to your mouth, nose, or eyes.   Keep all follow-up visits as directed by your health care provider.  SEEK MEDICAL CARE IF: Your symptoms do not improve after 1 week of treatment.  SEEK IMMEDIATE MEDICAL CARE IF:  You develop an increased fever or chills.   You have chest pain.   You have severe shortness of breath.  You have bloody sputum.   You develop dehydration.  You faint or repeatedly feel like you are going to pass out.  You develop repeated vomiting.  You develop a severe headache. MAKE SURE YOU:   Understand these instructions.  Will watch your condition.  Will get help right away if you are not doing well or get worse.   This information is not intended to replace advice given to you by your health care provider. Make sure you discuss any questions you have with your health care provider.   Document Released: 07/04/2004 Document Revised: 06/17/2014 Document Reviewed: 11/17/2012 Elsevier Interactive Patient Education [employer].  Please return immediately if condition worsens. Please contact her primary physician or the physician you were given for  referral. If you have any specialist physicians involved in her treatment and plan please also contact them. Thank you for using [facility]. Increase your potassium to twice a day. NyQuil at night.

## 2015-08-27 NOTE — ED Notes (Signed)
Discussed discharge instructions, prescriptions, and follow-up care with patient. No questions or concerns at this time. Pt stable at discharge.  

## 2015-08-28 LAB — TOXASSURE SELECT 13 (MW), URINE: PDF: 0

## 2015-09-06 DIAGNOSIS — E890 Postprocedural hypothyroidism: Secondary | ICD-10-CM | POA: Insufficient documentation

## 2015-09-06 DIAGNOSIS — E039 Hypothyroidism, unspecified: Secondary | ICD-10-CM | POA: Insufficient documentation

## 2015-09-06 DIAGNOSIS — R600 Localized edema: Secondary | ICD-10-CM | POA: Insufficient documentation

## 2015-09-13 NOTE — Progress Notes (Signed)
Quick Note:  NOTE: This forensic urine drug screen (UDS) test was conducted using a state-of-the-art ultra high performance liquid chromatography and mass spectrometry system (UPLC/MS-MS), the most sophisticated and accurate method available. UPLC/MS-MS is 1,000 times more precise and accurate than standard gas chromatography and mass spectrometry (GC/MS). This system can analyze 26 drug categories and 180 drug compounds.  The results of this test came back with unexpected findings. No oxycodone present ______

## 2015-10-09 ENCOUNTER — Other Ambulatory Visit: Payer: Self-pay | Admitting: Otolaryngology

## 2015-10-09 DIAGNOSIS — E041 Nontoxic single thyroid nodule: Secondary | ICD-10-CM

## 2015-10-27 ENCOUNTER — Ambulatory Visit: Payer: Medicare Other | Attending: Neurology

## 2015-10-27 DIAGNOSIS — G4739 Other sleep apnea: Secondary | ICD-10-CM | POA: Diagnosis present

## 2015-10-27 DIAGNOSIS — G4733 Obstructive sleep apnea (adult) (pediatric): Secondary | ICD-10-CM | POA: Diagnosis not present

## 2015-10-31 ENCOUNTER — Ambulatory Visit: Payer: Medicare Other | Attending: Pain Medicine | Admitting: Pain Medicine

## 2015-10-31 ENCOUNTER — Encounter: Payer: Self-pay | Admitting: Pain Medicine

## 2015-10-31 VITALS — BP 135/69 | HR 82 | Temp 98.5°F | Resp 12 | Ht 67.0 in | Wt 305.0 lb

## 2015-10-31 DIAGNOSIS — K449 Diaphragmatic hernia without obstruction or gangrene: Secondary | ICD-10-CM | POA: Insufficient documentation

## 2015-10-31 DIAGNOSIS — R079 Chest pain, unspecified: Secondary | ICD-10-CM | POA: Diagnosis not present

## 2015-10-31 DIAGNOSIS — M542 Cervicalgia: Secondary | ICD-10-CM | POA: Insufficient documentation

## 2015-10-31 DIAGNOSIS — G8929 Other chronic pain: Secondary | ICD-10-CM | POA: Insufficient documentation

## 2015-10-31 DIAGNOSIS — M47812 Spondylosis without myelopathy or radiculopathy, cervical region: Secondary | ICD-10-CM | POA: Insufficient documentation

## 2015-10-31 DIAGNOSIS — Z87820 Personal history of traumatic brain injury: Secondary | ICD-10-CM | POA: Insufficient documentation

## 2015-10-31 DIAGNOSIS — G4733 Obstructive sleep apnea (adult) (pediatric): Secondary | ICD-10-CM | POA: Insufficient documentation

## 2015-10-31 DIAGNOSIS — R0600 Dyspnea, unspecified: Secondary | ICD-10-CM | POA: Diagnosis not present

## 2015-10-31 DIAGNOSIS — E039 Hypothyroidism, unspecified: Secondary | ICD-10-CM | POA: Diagnosis not present

## 2015-10-31 DIAGNOSIS — Z8711 Personal history of peptic ulcer disease: Secondary | ICD-10-CM | POA: Insufficient documentation

## 2015-10-31 DIAGNOSIS — R32 Unspecified urinary incontinence: Secondary | ICD-10-CM | POA: Insufficient documentation

## 2015-10-31 DIAGNOSIS — E559 Vitamin D deficiency, unspecified: Secondary | ICD-10-CM | POA: Insufficient documentation

## 2015-10-31 DIAGNOSIS — R252 Cramp and spasm: Secondary | ICD-10-CM | POA: Diagnosis not present

## 2015-10-31 DIAGNOSIS — M1712 Unilateral primary osteoarthritis, left knee: Secondary | ICD-10-CM | POA: Insufficient documentation

## 2015-10-31 DIAGNOSIS — H9190 Unspecified hearing loss, unspecified ear: Secondary | ICD-10-CM | POA: Diagnosis not present

## 2015-10-31 DIAGNOSIS — M549 Dorsalgia, unspecified: Secondary | ICD-10-CM | POA: Diagnosis present

## 2015-10-31 DIAGNOSIS — I10 Essential (primary) hypertension: Secondary | ICD-10-CM | POA: Diagnosis not present

## 2015-10-31 DIAGNOSIS — E538 Deficiency of other specified B group vitamins: Secondary | ICD-10-CM | POA: Insufficient documentation

## 2015-10-31 DIAGNOSIS — M47892 Other spondylosis, cervical region: Secondary | ICD-10-CM | POA: Diagnosis not present

## 2015-10-31 DIAGNOSIS — M797 Fibromyalgia: Secondary | ICD-10-CM | POA: Insufficient documentation

## 2015-10-31 DIAGNOSIS — M25552 Pain in left hip: Secondary | ICD-10-CM | POA: Insufficient documentation

## 2015-10-31 DIAGNOSIS — E785 Hyperlipidemia, unspecified: Secondary | ICD-10-CM | POA: Diagnosis not present

## 2015-10-31 DIAGNOSIS — G43909 Migraine, unspecified, not intractable, without status migrainosus: Secondary | ICD-10-CM | POA: Diagnosis not present

## 2015-10-31 DIAGNOSIS — M545 Low back pain: Secondary | ICD-10-CM | POA: Insufficient documentation

## 2015-10-31 DIAGNOSIS — M1612 Unilateral primary osteoarthritis, left hip: Secondary | ICD-10-CM | POA: Diagnosis not present

## 2015-10-31 DIAGNOSIS — M47816 Spondylosis without myelopathy or radiculopathy, lumbar region: Secondary | ICD-10-CM | POA: Diagnosis not present

## 2015-10-31 DIAGNOSIS — E539 Vitamin B deficiency, unspecified: Secondary | ICD-10-CM | POA: Insufficient documentation

## 2015-10-31 DIAGNOSIS — Z79891 Long term (current) use of opiate analgesic: Secondary | ICD-10-CM | POA: Diagnosis not present

## 2015-10-31 DIAGNOSIS — M25562 Pain in left knee: Secondary | ICD-10-CM | POA: Diagnosis not present

## 2015-10-31 DIAGNOSIS — M4726 Other spondylosis with radiculopathy, lumbar region: Secondary | ICD-10-CM | POA: Diagnosis not present

## 2015-10-31 DIAGNOSIS — K219 Gastro-esophageal reflux disease without esophagitis: Secondary | ICD-10-CM | POA: Insufficient documentation

## 2015-10-31 DIAGNOSIS — F411 Generalized anxiety disorder: Secondary | ICD-10-CM | POA: Insufficient documentation

## 2015-10-31 DIAGNOSIS — M5416 Radiculopathy, lumbar region: Secondary | ICD-10-CM

## 2015-10-31 DIAGNOSIS — J45909 Unspecified asthma, uncomplicated: Secondary | ICD-10-CM | POA: Insufficient documentation

## 2015-10-31 MED ORDER — SODIUM CHLORIDE 0.9 % IJ SOLN
INTRAMUSCULAR | Status: AC
  Administered 2015-10-31: 10:00:00
  Filled 2015-10-31: qty 10

## 2015-10-31 MED ORDER — TRIAMCINOLONE ACETONIDE 40 MG/ML IJ SUSP: 40.0000 mg | Freq: Once | INTRAMUSCULAR | Status: DC

## 2015-10-31 MED ORDER — ROPIVACAINE HCL 2 MG/ML IJ SOLN
INTRAMUSCULAR | Status: AC
  Administered 2015-10-31: 10:00:00
  Filled 2015-10-31: qty 10

## 2015-10-31 MED ORDER — TRIAMCINOLONE ACETONIDE 40 MG/ML IJ SUSP
INTRAMUSCULAR | Status: AC
  Administered 2015-10-31: 10:00:00
  Filled 2015-10-31: qty 1

## 2015-10-31 MED ORDER — SODIUM CHLORIDE 0.9% FLUSH
2.0000 mL | Freq: Once | INTRAVENOUS | Status: DC
Start: 2015-10-31 — End: 2015-11-22

## 2015-10-31 MED ORDER — LIDOCAINE HCL (PF) 1 % IJ SOLN
INTRAMUSCULAR | Status: AC
  Administered 2015-10-31: 10:00:00
  Filled 2015-10-31: qty 5

## 2015-10-31 MED ORDER — ROPIVACAINE HCL 2 MG/ML IJ SOLN: 2.0000 mL | Freq: Once | INTRAMUSCULAR | Status: DC

## 2015-10-31 MED ORDER — LIDOCAINE HCL (PF) 1 % IJ SOLN: 10.0000 mL | Freq: Once | INTRAMUSCULAR | Status: DC

## 2015-10-31 NOTE — Patient Instructions (Addendum)
Epidural Steroid Injection Patient Information  Description: The epidural space surrounds the nerves as they exit the spinal cord.  In some patients, the nerves can be compressed and inflamed by a bulging disc or a tight spinal canal (spinal stenosis).  By injecting steroids into the epidural space, we can bring irritated nerves into direct contact with a potentially helpful medication.  These steroids act directly on the irritated nerves and can reduce swelling and inflammation which often leads to decreased pain.  Epidural steroids may be injected anywhere along the spine and from the neck to the low back depending upon the location of your pain.   After numbing the skin with local anesthetic (like Novocaine), a small needle is passed into the epidural space slowly.  You may experience a sensation of pressure while this is being done.  The entire block usually last less than 10 minutes.  Conditions which may be treated by epidural steroids:   Low back and leg pain  Neck and arm pain  Spinal stenosis  Post-laminectomy syndrome  Herpes zoster (shingles) pain  Pain from compression fractures  Preparation for the injection:   Do not eat any solid food or dairy products within 8 hours of your appointment.   You may drink clear liquids up to 3 hours before appointment.  Clear liquids include water, black coffee, juice or soda.  No milk or cream please.  You may take your regular medication, including pain medications, with a sip of water before your appointment  Diabetics should hold regular insulin (if taken separately) and take 1/2 normal NPH dos the morning of the procedure.  Carry some sugar containing items with you to your appointment.  A driver must accompany you and be prepared to drive you home after your procedure.   Bring all your current medications with your.  An IV may be inserted and sedation may be given at the discretion of the physician.    A blood pressure cuff, EKG  and other monitors will often be applied during the procedure.  Some patients may need to have extra oxygen administered for a short period.  You will be asked to provide medical information, including your allergies, prior to the procedure.  We must know immediately if you are taking blood thinners (like Coumadin/Warfarin)  Or if you are allergic to IV iodine contrast (dye). We must know if you could possible be pregnant.  Possible side-effects:  Bleeding from needle site  Infection (rare, may require surgery)  Nerve injury (rare)  Numbness & tingling (temporary)  Difficulty urinating (rare, temporary)  Spinal headache ( a headache worse with upright posture)  Light -headedness (temporary)  Pain at injection site (several days)  Decreased blood pressure (temporary)  Weakness in arm/leg (temporary)  Pressure sensation in back/neck (temporary)  Call if you experience:  Fever/chills associated with headache or increased back/neck pain.  Headache worsened by an upright position.  New onset weakness or numbness of an extremity below the injection site  Hives or difficulty breathing (go to the emergency room)  Inflammation or drainage at the infection site  Severe back/neck pain  Any new symptoms which are concerning to you  Please note:  Although the local anesthetic injected can often make your back or neck feel good for several hours after the injection, the pain will likely return.  It takes 3-7 days for steroids to work in the epidural space.  You may not notice any pain relief for at least that one week.  If effective, we will often do a series of three injections spaced 3-6 weeks apart to maximally decrease your pain.  After the initial series, we generally will wait several months before considering a repeat injection of the same type.  If you have any questions, please call [phone] [facility]  Instructions  General Discharge Instructions :  If you need to reach your doctor call: Monday-Friday 8:00 am - 4:00 pm at [phone] or toll free [phone].  After clinic hours [phone] to have operator reach doctor.  Bring all of your medication bottles to all your appointments in the pain clinic.  To cancel or reschedule your appointment with Pain Management please remember to call 24 hours in advance to avoid a fee.  Refer to the educational materials which you have been given on: General Risks, I had my Procedure. Discharge Instructions, Post Sedation.  Post Procedure Instructions:  The drugs you were given will stay in your system until tomorrow, so for the next 24 hours you should not drive, make any legal decisions or drink any alcoholic beverages.  You may eat anything you prefer, but it is better to start with liquids then soups and crackers, and gradually work up to solid foods.  Please notify your doctor immediately if you have any unusual bleeding, trouble breathing or pain that is not related to your normal pain.  Depending on the type of procedure that was done, some parts of your body may feel week and/or numb.  This usually clears up by tonight or the next day.  Walk with the use of an assistive device or accompanied by an adult for the 24 hours.  You may use ice on the affected area for the first 24 hours.  Put ice in a Ziploc bag and cover with a towel and place against area 15 minutes on 15 minutes off.  You may switch to heat after 24 hours.Pain Management Discharge Instructions  General Discharge Instructions :  If you need to reach your doctor call: Monday-Friday 8:00 am - 4:00 pm at [phone] or toll free [phone].  After clinic hours [phone] to have operator reach doctor.  Bring all of your medication bottles to all your appointments in the pain clinic.  To cancel or reschedule your appointment with Pain Management please remember to  call 24 hours in advance to avoid a fee.  Refer to the educational materials which you have been given on: General Risks, I had my Procedure. Discharge Instructions, Post Sedation.  Post Procedure Instructions:  The drugs you were given will stay in your system until tomorrow, so for the next 24 hours you should not drive, make any legal decisions or drink any alcoholic beverages.  You may eat anything you prefer, but it is better to start with liquids then soups and crackers, and gradually work up to solid foods.  Please notify your doctor immediately if you have any unusual bleeding, trouble breathing or pain that is not related to your normal pain.  Depending on the type of procedure that was done, some parts of your body may feel week and/or numb.  This usually clears up by tonight or the next day.  Walk with the use of an assistive device or accompanied by an adult for the 24 hours.  You may use ice on the affected area for the first 24 hours.  Put ice in a Ziploc bag and cover with a towel and place against area 15  minutes on 15 minutes off.  You may switch to heat after 24 hours.GENERAL RISKS AND COMPLICATIONS  What are the risk, side effects and possible complications? Generally speaking, most procedures are safe.  However, with any procedure there are risks, side effects, and the possibility of complications.  The risks and complications are dependent upon the sites that are lesioned, or the type of nerve block to be performed.  The closer the procedure is to the spine, the more serious the risks are.  Great care is taken when placing the radio frequency needles, block needles or lesioning probes, but sometimes complications can occur. 1. Infection: Any time there is an injection through the skin, there is a risk of infection.  This is why sterile conditions are used for these blocks.  There are four possible types of infection. 1. Localized skin infection. 2. Central Nervous System  Infection-This can be in the form of Meningitis, which can be deadly. 3. Epidural Infections-This can be in the form of an epidural abscess, which can cause pressure inside of the spine, causing compression of the spinal cord with subsequent paralysis. This would require an emergency surgery to decompress, and there are no guarantees that the patient would recover from the paralysis. 4. Discitis-This is an infection of the intervertebral discs.  It occurs in about 1% of discography procedures.  It is difficult to treat and it may lead to surgery.        2. Pain: the needles have to go through skin and soft tissues, will cause soreness.       3. Damage to internal structures:  The nerves to be lesioned may be near blood vessels or    other nerves which can be potentially damaged.       4. Bleeding: Bleeding is more common if the patient is taking blood thinners such as  aspirin, Coumadin, Ticiid, Plavix, etc., or if he/she have some genetic predisposition  such as hemophilia. Bleeding into the spinal canal can cause compression of the spinal  cord with subsequent paralysis.  This would require an emergency surgery to  decompress and there are no guarantees that the patient would recover from the  paralysis.       5. Pneumothorax:  Puncturing of a lung is a possibility, every time a needle is introduced in  the area of the chest or upper back.  Pneumothorax refers to free air around the  collapsed lung(s), inside of the thoracic cavity (chest cavity).  Another two possible  complications related to a similar event would include: Hemothorax and Chylothorax.   These are variations of the Pneumothorax, where instead of air around the collapsed  lung(s), you may have blood or chyle, respectively.       6. Spinal headaches: They may occur with any procedures in the area of the spine.       7. Persistent CSF (Cerebro-Spinal Fluid) leakage: This is a rare problem, but may occur  with prolonged intrathecal or  epidural catheters either due to the formation of a fistulous  track or a dural tear.       8. Nerve damage: By working so close to the spinal cord, there is always a possibility of  nerve damage, which could be as serious as a permanent spinal cord injury with  paralysis.       9. Death:  Although rare, severe deadly allergic reactions known as "Anaphylactic  reaction" can occur to any of the medications used.  10. Worsening of the symptoms:  We can always make thing worse.  What are the chances of something like this happening? Chances of any of this occuring are extremely low.  By statistics, you have more of a chance of getting killed in a motor vehicle accident: while driving to the hospital than any of the above occurring .  Nevertheless, you should be aware that they are possibilities.  In general, it is similar to taking a shower.  Everybody knows that you can slip, hit your head and get killed.  Does that mean that you should not shower again?  Nevertheless always keep in mind that statistics do not mean anything if you happen to be on the wrong side of them.  Even if a procedure has a 1 (one) in a 1,000,000 (million) chance of going wrong, it you happen to be that one..Also, keep in mind that by statistics, you have more of a chance of having something go wrong when taking medications.  Who should not have this procedure? If you are on a blood thinning medication (e.g. Coumadin, Plavix, see list of "Blood Thinners"), or if you have an active infection going on, you should not have the procedure.  If you are taking any blood thinners, please inform your physician.  How should I prepare for this procedure?  Do not eat or drink anything at least six hours prior to the procedure.  Bring a driver with you .  It cannot be a taxi.  Come accompanied by an adult that can drive you back, and that is strong enough to help you if your legs get weak or numb from the local anesthetic.  Take all of  your medicines the morning of the procedure with just enough water to swallow them.  If you have diabetes, make sure that you are scheduled to have your procedure done first thing in the morning, whenever possible.  If you have diabetes, take only half of your insulin dose and notify our nurse that you have done so as soon as you arrive at the clinic.  If you are diabetic, but only take blood sugar pills (oral hypoglycemic), then do not take them on the morning of your procedure.  You may take them after you have had the procedure.  Do not take aspirin or any aspirin-containing medications, at least eleven (11) days prior to the procedure.  They may prolong bleeding.  Wear loose fitting clothing that may be easy to take off and that you would not mind if it got stained with Betadine or blood.  Do not wear any jewelry or perfume  Remove any nail coloring.  It will interfere with some of our monitoring equipment.  NOTE: Remember that this is not meant to be interpreted as a complete list of all possible complications.  Unforeseen problems may occur.  BLOOD THINNERS The following drugs contain aspirin or other products, which can cause increased bleeding during surgery and should not be taken for 2 weeks prior to and 1 week after surgery.  If you should need take something for relief of minor pain, you may take acetaminophen which is found in Tylenol,m Datril, Anacin-3 and Panadol. It is not blood thinner. The products listed below are.  Do not take any of the products listed below in addition to any listed on your instruction sheet.  A.P.C or A.P.C with Codeine Codeine Phosphate Capsules #3 Ibuprofen Ridaura  ABC compound Congesprin Imuran rimadil  Advil Cope Indocin Robaxisal  Alka-Seltzer  Effervescent Pain Reliever and Antacid Coricidin or Coricidin-D  Indomethacin Rufen  Alka-Seltzer plus Cold Medicine Cosprin Ketoprofen S-A-C Tablets  Anacin Analgesic Tablets or Capsules Coumadin  Korlgesic Salflex  Anacin Extra Strength Analgesic tablets or capsules CP-2 Tablets Lanoril Salicylate  Anaprox Cuprimine Capsules Levenox Salocol  Anexsia-D Dalteparin Magan Salsalate  Anodynos Darvon compound Magnesium Salicylate Sine-off  Ansaid Dasin Capsules Magsal Sodium Salicylate  Anturane Depen Capsules Marnal Soma  APF Arthritis pain formula Dewitt's Pills Measurin Stanback  Argesic Dia-Gesic Meclofenamic Sulfinpyrazone  Arthritis Bayer Timed Release Aspirin Diclofenac Meclomen Sulindac  Arthritis pain formula Anacin Dicumarol Medipren Supac  Analgesic (Safety coated) Arthralgen Diffunasal Mefanamic Suprofen  Arthritis Strength Bufferin Dihydrocodeine Mepro Compound Suprol  Arthropan liquid Dopirydamole Methcarbomol with Aspirin Synalgos  ASA tablets/Enseals Disalcid Micrainin Tagament  Ascriptin Doan's Midol Talwin  Ascriptin A/D Dolene Mobidin Tanderil  Ascriptin Extra Strength Dolobid Moblgesic Ticlid  Ascriptin with Codeine Doloprin or Doloprin with Codeine Momentum Tolectin  Asperbuf Duoprin Mono-gesic Trendar  Aspergum Duradyne Motrin or Motrin IB Triminicin  Aspirin plain, buffered or enteric coated Durasal Myochrisine Trigesic  Aspirin Suppositories Easprin Nalfon Trillsate  Aspirin with Codeine Ecotrin Regular or Extra Strength Naprosyn Uracel  Atromid-S Efficin Naproxen Ursinus  Auranofin Capsules Elmiron Neocylate Vanquish  Axotal Emagrin Norgesic Verin  Azathioprine Empirin or Empirin with Codeine Normiflo Vitamin E  Azolid Emprazil Nuprin Voltaren  Bayer Aspirin plain, buffered or children's or timed BC Tablets or powders Encaprin Orgaran Warfarin Sodium  Buff-a-Comp Enoxaparin Orudis Zorpin  Buff-a-Comp with Codeine Equegesic Os-Cal-Gesic   Buffaprin Excedrin plain, buffered or Extra Strength Oxalid   Bufferin Arthritis Strength Feldene Oxphenbutazone   Bufferin plain or Extra Strength Feldene Capsules Oxycodone with Aspirin   Bufferin with Codeine  Fenoprofen Fenoprofen Pabalate or Pabalate-SF   Buffets II Flogesic Panagesic   Buffinol plain or Extra Strength Florinal or Florinal with Codeine Panwarfarin   Buf-Tabs Flurbiprofen Penicillamine   Butalbital Compound Four-way cold tablets Penicillin   Butazolidin Fragmin Pepto-Bismol   Carbenicillin Geminisyn Percodan   Carna Arthritis Reliever Geopen Persantine   Carprofen Gold's salt Persistin   Chloramphenicol Goody's Phenylbutazone   Chloromycetin Haltrain Piroxlcam   Clmetidine heparin Plaquenil   Cllnoril Hyco-pap Ponstel   Clofibrate Hydroxy chloroquine Propoxyphen         Before stopping any of these medications, be sure to consult the physician who ordered them.  Some, such as Coumadin (Warfarin) are ordered to prevent or treat serious conditions such as "deep thrombosis", "pumonary embolisms", and other heart problems.  The amount of time that you may need off of the medication may also vary with the medication and the reason for which you were taking it.  If you are taking any of these medications, please make sure you notify your pain physician before you undergo any procedures.         Epidural Steroid Injection An epidural steroid injection is given to relieve pain in your neck, back, or legs that is caused by the irritation or swelling of a nerve root. This procedure involves injecting a steroid and numbing medicine (anesthetic) into the epidural space. The epidural space is the space between the outer covering of your spinal cord and the bones that form your backbone (vertebra).  LET [facility] CARE PROVIDER KNOW ABOUT:  7. Any allergies you have. 8. All medicines you are taking, including vitamins, herbs, eye drops, creams, and over-the-counter medicines such as aspirin. 9. Previous problems you or members of your family have had with the use  of anesthetics. 10. Any blood disorders or blood clotting disorders you have. 11. Previous surgeries you have  had. 12. Medical conditions you have. RISKS AND COMPLICATIONS Generally, this is a safe procedure. However, as with any procedure, complications can occur. Possible complications of epidural steroid injection include:  Headache.  Bleeding.  Infection.  Allergic reaction to the medicines.  Damage to your nerves. The response to this procedure depends on the underlying cause of the pain and its duration. People who have long-term (chronic) pain are less likely to benefit from epidural steroids than are those people whose pain comes on strong and suddenly. BEFORE THE PROCEDURE   Ask your health care provider about changing or stopping your regular medicines. You may be advised to stop taking blood-thinning medicines a few days before the procedure.  You may be given medicines to reduce anxiety.  Arrange for someone to take you home after the procedure. PROCEDURE   You will remain awake during the procedure. You may receive medicine to make you relaxed.  You will be asked to lie on your stomach.  The injection site will be cleaned.  The injection site will be numbed with a medicine (local anesthetic).  A needle will be injected through your skin into the epidural space.  Your health care provider will use an X-ray machine to ensure that the steroid is delivered closest to the affected nerve. You may have minimal discomfort at this time.  Once the needle is in the right position, the local anesthetic and the steroid will be injected into the epidural space.  The needle will then be removed and a bandage will be applied to the injection site. AFTER THE PROCEDURE  12. You may be monitored for a short time before you go home. 13. You may feel weakness or numbness in your arm or leg, which disappears within hours. 14. You may be allowed to eat, drink, and take your regular medicine. 15. You may have soreness at the site of the injection.   This information is not intended to  replace advice given to you by your health care provider. Make sure you discuss any questions you have with your health care provider.   Document Released: 09/03/2007 Document Revised: 01/27/2013 Document Reviewed: 11/13/2012 Elsevier Interactive Patient Education [employer].
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
## 2015-10-31 NOTE — Progress Notes (Signed)
Patient here for procedure appt today.  States she is way overdue for this but has had "somethings" going on.  States her back has been really hurting and has had difficulty getting much of anything done.  She is prescribed Gabapentin 300 mg tid and she would like to see if you can adjust this dosage d/t leg pain.    Oxycodone HCL 5 mg , 1 tablet q 6 hours last filled 10/24/15 qty 90/120 Safety precautions to be maintained throughout the outpatient stay will include: orient to surroundings, keep bed in low position, maintain call bell within reach at all times, provide assistance with transfer out of bed and ambulation.

## 2015-11-01 ENCOUNTER — Telehealth: Payer: Self-pay | Admitting: *Deleted

## 2015-11-01 NOTE — Telephone Encounter (Signed)
Left voicemail with patient to call our office if there are questions or concerns re; procedure.

## 2015-11-10 ENCOUNTER — Other Ambulatory Visit: Payer: Self-pay | Admitting: Otolaryngology

## 2015-11-10 DIAGNOSIS — E041 Nontoxic single thyroid nodule: Secondary | ICD-10-CM

## 2015-11-22 ENCOUNTER — Encounter (INDEPENDENT_AMBULATORY_CARE_PROVIDER_SITE_OTHER): Payer: Self-pay

## 2015-11-22 ENCOUNTER — Encounter: Payer: Self-pay | Admitting: Pain Medicine

## 2015-11-22 ENCOUNTER — Ambulatory Visit
Admission: RE | Admit: 2015-11-22 | Discharge: 2015-11-22 | Disposition: A | Discharge status: Home / Self Care | Payer: Medicare Other | Source: Ambulatory Visit | Attending: Pain Medicine | Admitting: Pain Medicine

## 2015-11-22 ENCOUNTER — Ambulatory Visit (HOSPITAL_BASED_OUTPATIENT_CLINIC_OR_DEPARTMENT_OTHER): Payer: Medicare Other | Admitting: Pain Medicine

## 2015-11-22 VITALS — BP 141/76 | HR 85 | Temp 98.2°F | Resp 16 | Ht 67.0 in | Wt 300.0 lb

## 2015-11-22 DIAGNOSIS — M25552 Pain in left hip: Secondary | ICD-10-CM | POA: Insufficient documentation

## 2015-11-22 DIAGNOSIS — M542 Cervicalgia: Secondary | ICD-10-CM | POA: Insufficient documentation

## 2015-11-22 DIAGNOSIS — F411 Generalized anxiety disorder: Secondary | ICD-10-CM | POA: Insufficient documentation

## 2015-11-22 DIAGNOSIS — Z79891 Long term (current) use of opiate analgesic: Secondary | ICD-10-CM | POA: Diagnosis not present

## 2015-11-22 DIAGNOSIS — Z87891 Personal history of nicotine dependence: Secondary | ICD-10-CM | POA: Diagnosis not present

## 2015-11-22 DIAGNOSIS — K219 Gastro-esophageal reflux disease without esophagitis: Secondary | ICD-10-CM | POA: Diagnosis not present

## 2015-11-22 DIAGNOSIS — M47816 Spondylosis without myelopathy or radiculopathy, lumbar region: Secondary | ICD-10-CM

## 2015-11-22 DIAGNOSIS — R32 Unspecified urinary incontinence: Secondary | ICD-10-CM | POA: Diagnosis not present

## 2015-11-22 DIAGNOSIS — M1612 Unilateral primary osteoarthritis, left hip: Secondary | ICD-10-CM | POA: Diagnosis not present

## 2015-11-22 DIAGNOSIS — M150 Primary generalized (osteo)arthritis: Secondary | ICD-10-CM

## 2015-11-22 DIAGNOSIS — Z6841 Body Mass Index (BMI) 40.0 and over, adult: Secondary | ICD-10-CM | POA: Insufficient documentation

## 2015-11-22 DIAGNOSIS — M7918 Myalgia, other site: Secondary | ICD-10-CM

## 2015-11-22 DIAGNOSIS — M25562 Pain in left knee: Secondary | ICD-10-CM

## 2015-11-22 DIAGNOSIS — M545 Low back pain, unspecified: Secondary | ICD-10-CM

## 2015-11-22 DIAGNOSIS — M159 Polyosteoarthritis, unspecified: Secondary | ICD-10-CM

## 2015-11-22 DIAGNOSIS — G8929 Other chronic pain: Secondary | ICD-10-CM

## 2015-11-22 DIAGNOSIS — J45909 Unspecified asthma, uncomplicated: Secondary | ICD-10-CM | POA: Insufficient documentation

## 2015-11-22 DIAGNOSIS — R252 Cramp and spasm: Secondary | ICD-10-CM | POA: Insufficient documentation

## 2015-11-22 DIAGNOSIS — K449 Diaphragmatic hernia without obstruction or gangrene: Secondary | ICD-10-CM | POA: Insufficient documentation

## 2015-11-22 DIAGNOSIS — M79643 Pain in unspecified hand: Secondary | ICD-10-CM | POA: Insufficient documentation

## 2015-11-22 DIAGNOSIS — M792 Neuralgia and neuritis, unspecified: Secondary | ICD-10-CM

## 2015-11-22 DIAGNOSIS — R079 Chest pain, unspecified: Secondary | ICD-10-CM | POA: Diagnosis not present

## 2015-11-22 DIAGNOSIS — M47812 Spondylosis without myelopathy or radiculopathy, cervical region: Secondary | ICD-10-CM | POA: Diagnosis not present

## 2015-11-22 DIAGNOSIS — E785 Hyperlipidemia, unspecified: Secondary | ICD-10-CM | POA: Diagnosis not present

## 2015-11-22 DIAGNOSIS — M797 Fibromyalgia: Secondary | ICD-10-CM | POA: Diagnosis not present

## 2015-11-22 DIAGNOSIS — M79604 Pain in right leg: Secondary | ICD-10-CM

## 2015-11-22 DIAGNOSIS — E039 Hypothyroidism, unspecified: Secondary | ICD-10-CM | POA: Diagnosis not present

## 2015-11-22 DIAGNOSIS — G43909 Migraine, unspecified, not intractable, without status migrainosus: Secondary | ICD-10-CM | POA: Diagnosis not present

## 2015-11-22 DIAGNOSIS — E559 Vitamin D deficiency, unspecified: Secondary | ICD-10-CM | POA: Diagnosis not present

## 2015-11-22 DIAGNOSIS — M25512 Pain in left shoulder: Secondary | ICD-10-CM | POA: Insufficient documentation

## 2015-11-22 DIAGNOSIS — M79606 Pain in leg, unspecified: Secondary | ICD-10-CM

## 2015-11-22 DIAGNOSIS — M1712 Unilateral primary osteoarthritis, left knee: Secondary | ICD-10-CM

## 2015-11-22 DIAGNOSIS — M791 Myalgia: Secondary | ICD-10-CM | POA: Insufficient documentation

## 2015-11-22 DIAGNOSIS — G4733 Obstructive sleep apnea (adult) (pediatric): Secondary | ICD-10-CM | POA: Diagnosis not present

## 2015-11-22 DIAGNOSIS — D126 Benign neoplasm of colon, unspecified: Secondary | ICD-10-CM | POA: Diagnosis not present

## 2015-11-22 DIAGNOSIS — Z5181 Encounter for therapeutic drug level monitoring: Secondary | ICD-10-CM

## 2015-11-22 DIAGNOSIS — I10 Essential (primary) hypertension: Secondary | ICD-10-CM | POA: Diagnosis not present

## 2015-11-22 DIAGNOSIS — M5416 Radiculopathy, lumbar region: Secondary | ICD-10-CM

## 2015-11-22 DIAGNOSIS — M79605 Pain in left leg: Secondary | ICD-10-CM

## 2015-11-22 DIAGNOSIS — M25511 Pain in right shoulder: Secondary | ICD-10-CM

## 2015-11-22 DIAGNOSIS — F1190 Opioid use, unspecified, uncomplicated: Secondary | ICD-10-CM

## 2015-11-22 DIAGNOSIS — E538 Deficiency of other specified B group vitamins: Secondary | ICD-10-CM | POA: Insufficient documentation

## 2015-11-22 DIAGNOSIS — M170 Bilateral primary osteoarthritis of knee: Secondary | ICD-10-CM | POA: Diagnosis not present

## 2015-11-22 MED ORDER — OXYCODONE HCL 5 MG PO TABS: 5.0000 mg | ORAL_TABLET | Freq: Four times a day (QID) | ORAL | Status: DC | PRN

## 2015-11-22 MED ORDER — GABAPENTIN 300 MG PO CAPS: 300.0000 mg | ORAL_CAPSULE | Freq: Three times a day (TID) | ORAL | Status: DC

## 2015-11-22 MED ORDER — CYCLOBENZAPRINE HCL 10 MG PO TABS: 10.0000 mg | ORAL_TABLET | Freq: Three times a day (TID) | ORAL | Status: DC | PRN

## 2015-11-22 MED ORDER — GABAPENTIN 300 MG PO CAPS: 300.0000 mg | ORAL_CAPSULE | Freq: Four times a day (QID) | ORAL | Status: DC

## 2015-11-22 MED ORDER — BACLOFEN 10 MG PO TABS: 10.0000 mg | ORAL_TABLET | Freq: Every evening | ORAL | Status: DC | PRN

## 2015-11-22 NOTE — Progress Notes (Signed)
Patient here for medication management.  Patient states that they are in the middle of moving and she has had to do quite a bit of heavy lifting.   Does not have any pills left to count, ran out last night.  Safety precautions to be maintained throughout the outpatient stay will include: orient to surroundings, keep bed in low position, maintain call bell within reach at all times, provide assistance with transfer out of bed and ambulation.

## 2015-11-22 NOTE — Progress Notes (Signed)
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 "Automatic Refill". Patient may have prescription filled one day early if pharmacy is closed on scheduled refill date. Do not fill until: 11/22/15 To last until: 12/22/15  . cyclobenzaprine (FLEXERIL) 10 MG tablet    Sig: Take 1 tablet (10 mg total) by mouth 3 (three) times daily as needed for muscle spasms.    Dispense:  90 tablet    Refill:  0    Do not add this medication to the electronic "Automatic Refill" notification system. Patient may have prescription filled one day early if pharmacy is closed on scheduled refill date.  . baclofen (LIORESAL) 10 MG tablet    Sig: Take 1 tablet (10 mg total) by mouth at bedtime as needed for muscle spasms. Hold Flexeril when taking this medication.    Dispense:  30 tablet    Refill:  0    Do not place this medication, or any other prescription from our practice, on "Automatic Refill". Patient may have prescription filled one day early if pharmacy is closed on scheduled refill date.  . gabapentin (NEURONTIN) 300 MG capsule     Sig: Take 1-3 capsules (300-900 mg total) by mouth 4 (four) times daily. Follow written titration schedule.    Dispense:  360 capsule    Refill:  0    Do not place  this medication, or any other prescription from our practice, on "Automatic Refill". Patient may have prescription filled one day early if pharmacy is closed on scheduled refill date.    Lab-work & Procedure Ordered: Orders Placed This Encounter  Procedures  . LUMBAR EPIDURAL STEROID INJECTION  . DG Cervical Spine Complete  . DG Lumbar Spine Complete W/Bend  . DG Si Joints  . DG HIP UNILAT W OR W/O PELVIS 2-3 VIEWS LEFT  . DG Knee 1-2 Views Left  . ToxASSURE Select 13 (MW), Urine    Imaging Ordered: DG CERVICAL SPINE COMPLETE DG LUMBAR SPINE COMPLETE W/BEND 6+V DG SI JOINTS DG HIP UNILAT W OR W/O PELVIS 2-3 VIEWS LEFT DG KNEE 1-2 VIEWS LEFT  Interventional Therapies: Scheduled:  Left L4-5 lumbar epidural steroid injection under fluoroscopic guidance, without IV sedation.    Considering:   1. Left L4-5 lumbar epidural steroid injection #2 under fluoroscopic guidance, without IV sedation.  2. Left L5-S1 transforaminal epidural steroid injection under fluoroscopic guidance, with without sedation.  3. Right L4-5 transforaminal epidural steroid injection under fluoroscopic guidance, with or without sedation.  4. Left diagnostic cervical epidural steroid injection under fluoroscopic guidance, with or without sedation.  5. Diagnostic bilateral intra-articular knee injection under fluoroscopic guidance, no sedation.  6. Diagnostic bilateral genicular nerve block under fluoroscopic guidance with IV sedation.  7. Possible bilateral genicular nerve radiofrequency ablation under fluoroscopic guidance and IV sedation.  8. Diagnostic bilateral lumbar facet block under fluoroscopic guidance and IV sedation.  9. Possible bilateral lumbar facet radiofrequency ablation under fluoroscopic guidance and IV sedation.  10. Diagnostic  bilateral intra-articular hip joint injection under fluoroscopic guidance, with or without sedation.  11. Possible bilateral hip radiofrequency ablation under fluoroscopic guidance and IV sedation.  12. Diagnostic bilateral intra-articular shoulder joint injection under fluoroscopic guidance, with or without sedation.  13. Diagnostic bilateral suprascapular nerve block under fluoroscopic guidance, with or without sedation.  14. Possible bilateral suprascapular nerve radiofrequency ablation under fluoroscopic guidance and IV sedation.    PRN Procedures:  None at this time until we get the results of the recent x-rays ordered.    Referral(s) or Consult(s): None at this time.  New Prescriptions   No medications on file    Medications administered during this visit: Ms. Olbrich had no medications administered during this visit.  Requested PM Follow-up: Return in about 4 weeks (around 12/18/2015) for Medication Management, (1-Mo), Return after ordered test(s)..  Future Appointments Date Time Provider [place]  12/13/2015 2:40 PM Milinda Pointer, MD [facility] None  05/10/2016 3:00 PM [facility]    Primary Care Physician: [place] Location: [facility] Facility Note by: Kathlen Brunswick. Dossie Arbour, M.D, DABA, DABAPM, DABPM, DABIPP, FIPP  Pain Score Disclaimer: We use the NRS-11 scale. This is a self-reported, subjective measurement of pain severity with only modest accuracy. It is used primarily to identify changes within a particular patient. It must be understood that outpatient pain scales are significantly less accurate that those used for research, where they can be applied under ideal controlled circumstances with minimal exposure to variables. In reality, the score is likely to be a combination of pain intensity and pain affect, where pain affect describes the degree of emotional arousal or changes in action readiness caused by the  sensory experience of pain. Factors such as social and work situation, setting, emotional state, anxiety levels, expectation, and prior pain experience may influence pain perception and show large inter-individual differences that may  also be affected by time variables.  Patient instructions provided at this appointment:: There are no Patient Instructions on file for this visit.

## 2015-11-30 LAB — TOXASSURE SELECT 13 (MW), URINE: PDF: 0

## 2015-12-13 ENCOUNTER — Encounter (INDEPENDENT_AMBULATORY_CARE_PROVIDER_SITE_OTHER): Payer: Self-pay

## 2015-12-13 ENCOUNTER — Encounter: Payer: Self-pay | Admitting: Pain Medicine

## 2015-12-13 ENCOUNTER — Ambulatory Visit: Payer: Medicare Other | Attending: Pain Medicine | Admitting: Pain Medicine

## 2015-12-13 VITALS — BP 130/67 | HR 92 | Temp 98.5°F | Resp 16 | Ht 67.0 in | Wt 300.0 lb

## 2015-12-13 DIAGNOSIS — E039 Hypothyroidism, unspecified: Secondary | ICD-10-CM | POA: Insufficient documentation

## 2015-12-13 DIAGNOSIS — M549 Dorsalgia, unspecified: Secondary | ICD-10-CM | POA: Diagnosis present

## 2015-12-13 DIAGNOSIS — M25512 Pain in left shoulder: Secondary | ICD-10-CM

## 2015-12-13 DIAGNOSIS — M47816 Spondylosis without myelopathy or radiculopathy, lumbar region: Secondary | ICD-10-CM | POA: Diagnosis not present

## 2015-12-13 DIAGNOSIS — G4733 Obstructive sleep apnea (adult) (pediatric): Secondary | ICD-10-CM | POA: Diagnosis not present

## 2015-12-13 DIAGNOSIS — Z5181 Encounter for therapeutic drug level monitoring: Secondary | ICD-10-CM | POA: Diagnosis not present

## 2015-12-13 DIAGNOSIS — Z87891 Personal history of nicotine dependence: Secondary | ICD-10-CM | POA: Diagnosis not present

## 2015-12-13 DIAGNOSIS — M25552 Pain in left hip: Secondary | ICD-10-CM | POA: Diagnosis not present

## 2015-12-13 DIAGNOSIS — J45909 Unspecified asthma, uncomplicated: Secondary | ICD-10-CM | POA: Diagnosis not present

## 2015-12-13 DIAGNOSIS — M8580 Other specified disorders of bone density and structure, unspecified site: Secondary | ICD-10-CM | POA: Diagnosis not present

## 2015-12-13 DIAGNOSIS — M25511 Pain in right shoulder: Secondary | ICD-10-CM | POA: Diagnosis not present

## 2015-12-13 DIAGNOSIS — Z6841 Body Mass Index (BMI) 40.0 and over, adult: Secondary | ICD-10-CM | POA: Diagnosis not present

## 2015-12-13 DIAGNOSIS — K449 Diaphragmatic hernia without obstruction or gangrene: Secondary | ICD-10-CM | POA: Diagnosis not present

## 2015-12-13 DIAGNOSIS — M1712 Unilateral primary osteoarthritis, left knee: Secondary | ICD-10-CM

## 2015-12-13 DIAGNOSIS — Z8711 Personal history of peptic ulcer disease: Secondary | ICD-10-CM | POA: Insufficient documentation

## 2015-12-13 DIAGNOSIS — M533 Sacrococcygeal disorders, not elsewhere classified: Secondary | ICD-10-CM | POA: Diagnosis not present

## 2015-12-13 DIAGNOSIS — R252 Cramp and spasm: Secondary | ICD-10-CM

## 2015-12-13 DIAGNOSIS — E538 Deficiency of other specified B group vitamins: Secondary | ICD-10-CM | POA: Diagnosis not present

## 2015-12-13 DIAGNOSIS — M5382 Other specified dorsopathies, cervical region: Secondary | ICD-10-CM

## 2015-12-13 DIAGNOSIS — Z87820 Personal history of traumatic brain injury: Secondary | ICD-10-CM | POA: Insufficient documentation

## 2015-12-13 DIAGNOSIS — M47818 Spondylosis without myelopathy or radiculopathy, sacral and sacrococcygeal region: Secondary | ICD-10-CM

## 2015-12-13 DIAGNOSIS — M545 Low back pain, unspecified: Secondary | ICD-10-CM

## 2015-12-13 DIAGNOSIS — R32 Unspecified urinary incontinence: Secondary | ICD-10-CM | POA: Insufficient documentation

## 2015-12-13 DIAGNOSIS — F411 Generalized anxiety disorder: Secondary | ICD-10-CM | POA: Diagnosis not present

## 2015-12-13 DIAGNOSIS — M25551 Pain in right hip: Secondary | ICD-10-CM | POA: Insufficient documentation

## 2015-12-13 DIAGNOSIS — M170 Bilateral primary osteoarthritis of knee: Secondary | ICD-10-CM | POA: Insufficient documentation

## 2015-12-13 DIAGNOSIS — E559 Vitamin D deficiency, unspecified: Secondary | ICD-10-CM | POA: Diagnosis not present

## 2015-12-13 DIAGNOSIS — G8929 Other chronic pain: Secondary | ICD-10-CM | POA: Diagnosis not present

## 2015-12-13 DIAGNOSIS — I10 Essential (primary) hypertension: Secondary | ICD-10-CM | POA: Diagnosis not present

## 2015-12-13 DIAGNOSIS — K219 Gastro-esophageal reflux disease without esophagitis: Secondary | ICD-10-CM | POA: Insufficient documentation

## 2015-12-13 DIAGNOSIS — M25569 Pain in unspecified knee: Secondary | ICD-10-CM

## 2015-12-13 DIAGNOSIS — M47812 Spondylosis without myelopathy or radiculopathy, cervical region: Secondary | ICD-10-CM | POA: Diagnosis not present

## 2015-12-13 DIAGNOSIS — M160 Bilateral primary osteoarthritis of hip: Secondary | ICD-10-CM | POA: Diagnosis not present

## 2015-12-13 DIAGNOSIS — E785 Hyperlipidemia, unspecified: Secondary | ICD-10-CM | POA: Insufficient documentation

## 2015-12-13 DIAGNOSIS — Z79891 Long term (current) use of opiate analgesic: Secondary | ICD-10-CM | POA: Diagnosis not present

## 2015-12-13 DIAGNOSIS — M79606 Pain in leg, unspecified: Secondary | ICD-10-CM | POA: Diagnosis not present

## 2015-12-13 DIAGNOSIS — M792 Neuralgia and neuritis, unspecified: Secondary | ICD-10-CM | POA: Diagnosis not present

## 2015-12-13 DIAGNOSIS — M47817 Spondylosis without myelopathy or radiculopathy, lumbosacral region: Secondary | ICD-10-CM

## 2015-12-13 DIAGNOSIS — M797 Fibromyalgia: Secondary | ICD-10-CM | POA: Diagnosis not present

## 2015-12-13 DIAGNOSIS — M791 Myalgia: Secondary | ICD-10-CM

## 2015-12-13 DIAGNOSIS — M7918 Myalgia, other site: Secondary | ICD-10-CM

## 2015-12-13 DIAGNOSIS — M542 Cervicalgia: Secondary | ICD-10-CM | POA: Diagnosis present

## 2015-12-13 DIAGNOSIS — M5416 Radiculopathy, lumbar region: Secondary | ICD-10-CM

## 2015-12-13 DIAGNOSIS — M461 Sacroiliitis, not elsewhere classified: Secondary | ICD-10-CM

## 2015-12-13 MED ORDER — GABAPENTIN 300 MG PO CAPS: 300.0000 mg | ORAL_CAPSULE | Freq: Four times a day (QID) | ORAL | Status: DC

## 2015-12-13 MED ORDER — BACLOFEN 10 MG PO TABS: 10.0000 mg | ORAL_TABLET | Freq: Every evening | ORAL | Status: DC | PRN

## 2015-12-13 MED ORDER — OXYCODONE HCL 5 MG PO TABS: 5.0000 mg | ORAL_TABLET | Freq: Four times a day (QID) | ORAL | Status: DC | PRN

## 2015-12-13 MED ORDER — CYCLOBENZAPRINE HCL 10 MG PO TABS: 10.0000 mg | ORAL_TABLET | Freq: Three times a day (TID) | ORAL | Status: DC | PRN

## 2015-12-13 NOTE — Progress Notes (Signed)
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  Osteoarthritis of hip (Location of Tertiary source of pain) (Bilateral) (L>R)  Osteoarthritis of knee (Tricompartmental) (Left)  Chronic lumbar radicular pain (Left L5 and Right L4 dermatomes) (Location of Primary Source of Pain) (Bilateral) (L>R)  Cervical spondylosis (Degenerative changes most prominent C4-5 and C5-6)  Cervical facet syndrome (Bilateral) (L>R)  Osteopenia  Morbid Obesity With Body Mass Index (Bmi) of 45.0 to 49.9 in Adult ([facility])  Vitamin B12 Deficiency    Problem-specific Plan(s): No problem-specific assessment & plan notes found for this encounter.  No new assessment & plan notes have been filed under this hospital service since the last note was generated. Service: Pain Management   Plan of Care   Problem List Items Addressed This Visit      High   Cervical facet syndrome (Bilateral) (L>R) (Chronic)   Relevant Orders   CERVICAL FACET (MEDIAL BRANCH NERVE BLOCK)    Cervical spondylosis (Degenerative changes most prominent C4-5 and C5-6) (Chronic)   Relevant Medications   acetaminophen (TYLENOL) 500 MG  tablet   baclofen (LIORESAL) 10 MG tablet   cyclobenzaprine (FLEXERIL) 10 MG tablet   oxyCODONE (OXY IR/ROXICODONE) 5 MG immediate release tablet   oxyCODONE (OXY IR/ROXICODONE) 5 MG immediate release tablet   oxyCODONE (OXY IR/ROXICODONE) 5 MG immediate release tablet   Other Relevant Orders   MR Cervical Spine Wo Contrast   CERVICAL  EPIDURAL STEROID INJECTION   Chronic hip pain (Location of Tertiary source of pain) (Bilateral) (L>R) (Chronic)   Relevant Orders   HIP INJECTION   Chronic knee pain (Bilateral) (L>R) (Chronic)   Relevant Orders   KNEE INJECTION   GENICULAR NERVE BLOCK   Chronic low back pain (Location of Secondary source of pain) (Bilateral) (L>R) (Chronic)   Relevant Medications   acetaminophen (TYLENOL) 500 MG tablet   baclofen (LIORESAL) 10 MG tablet   cyclobenzaprine (FLEXERIL) 10 MG tablet   oxyCODONE (OXY IR/ROXICODONE) 5 MG immediate release tablet   oxyCODONE (OXY IR/ROXICODONE) 5 MG immediate release tablet   oxyCODONE (OXY IR/ROXICODONE) 5 MG immediate release tablet   Other Relevant Orders   LUMBAR FACET(MEDIAL BRANCH NERVE BLOCK) MBNB   Chronic lower extremity pain (Location of Primary Source of Pain) (Bilateral) (L>R) (Chronic)   Relevant Orders   LUMBAR EPIDURAL STEROID INJECTION   Lumbar Transforaminal epidural without steroid   Lumbar Transforaminal epidural without steroid   Chronic lumbar radicular pain (Left L5 and Right L4 dermatomes) (Location of Primary Source of Pain) (Bilateral) (L>R) (Chronic)   Relevant Orders   Lumbar Transforaminal epidural without steroid   Lumbar Transforaminal epidural without steroid   Chronic pain - Primary (Chronic)   Relevant Medications   acetaminophen (TYLENOL) 500 MG tablet   gabapentin (NEURONTIN) 300 MG capsule   baclofen (LIORESAL) 10 MG tablet   cyclobenzaprine (FLEXERIL) 10 MG tablet   oxyCODONE (OXY IR/ROXICODONE) 5 MG immediate release tablet   oxyCODONE (OXY IR/ROXICODONE) 5 MG immediate release  tablet   oxyCODONE (OXY IR/ROXICODONE) 5 MG immediate release tablet   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)   Relevant Medications   acetaminophen (TYLENOL) 500 MG tablet   baclofen (LIORESAL) 10 MG tablet   cyclobenzaprine (FLEXERIL) 10 MG tablet   oxyCODONE (OXY IR/ROXICODONE) 5 MG immediate release tablet   oxyCODONE (OXY IR/ROXICODONE) 5 MG immediate release tablet   oxyCODONE (OXY IR/ROXICODONE) 5 MG immediate release tablet   Other Relevant Orders   SACROILIAC JOINT INJECTINS   Chronic shoulder pain (Bilateral) (R>L) (Chronic)   Relevant Orders   DG Shoulder Left   DG Shoulder Right   SHOULDER INJECTION   SUPRASCAPULAR NERVE BLOCK   Lumbar facet syndrome (Location of Secondary source of pain) (Bilateral) (L>R) (Chronic)   Relevant Medications   acetaminophen (TYLENOL) 500 MG tablet   baclofen (LIORESAL) 10 MG tablet   cyclobenzaprine (FLEXERIL) 10 MG tablet   oxyCODONE (OXY IR/ROXICODONE) 5 MG immediate release tablet   oxyCODONE (OXY IR/ROXICODONE) 5 MG immediate release tablet   oxyCODONE (OXY IR/ROXICODONE) 5 MG immediate release tablet   Other Relevant Orders   LUMBAR FACET(MEDIAL BRANCH NERVE BLOCK) MBNB   Lumbar spondylosis (Chronic)   Relevant Medications   acetaminophen (TYLENOL) 500 MG tablet   baclofen (LIORESAL) 10 MG tablet   cyclobenzaprine (FLEXERIL) 10 MG tablet   oxyCODONE (OXY IR/ROXICODONE) 5 MG immediate release tablet   oxyCODONE (OXY IR/ROXICODONE) 5 MG immediate release tablet   oxyCODONE (OXY IR/ROXICODONE) 5 MG immediate release tablet   Other Relevant Orders   LUMBAR FACET(MEDIAL BRANCH NERVE BLOCK) MBNB   Muscle cramps at night (Left leg) (Chronic)   Relevant Medications   baclofen (LIORESAL) 10 MG tablet   cyclobenzaprine (FLEXERIL) 10 MG tablet   Musculoskeletal pain (Chronic)   Relevant Medications   cyclobenzaprine (FLEXERIL) 10 MG tablet   Neurogenic pain (Chronic)   Relevant Medications   gabapentin (NEURONTIN)  300 MG capsule   Osteoarthritis  of hip (Location of Tertiary source of pain) (Bilateral) (L>R) (Chronic)   Relevant Medications   acetaminophen (TYLENOL) 500 MG tablet   baclofen (LIORESAL) 10 MG tablet   cyclobenzaprine (FLEXERIL) 10 MG tablet   oxyCODONE (OXY IR/ROXICODONE) 5 MG immediate release tablet   oxyCODONE (OXY IR/ROXICODONE) 5 MG immediate release tablet   oxyCODONE (OXY IR/ROXICODONE) 5 MG immediate release tablet   Other Relevant Orders   HIP INJECTION   Osteoarthritis of knee (Tricompartmental) (Left) (Chronic)   Relevant Medications   acetaminophen (TYLENOL) 500 MG tablet   baclofen (LIORESAL) 10 MG tablet   cyclobenzaprine (FLEXERIL) 10 MG tablet   oxyCODONE (OXY IR/ROXICODONE) 5 MG immediate release tablet   oxyCODONE (OXY IR/ROXICODONE) 5 MG immediate release tablet   oxyCODONE (OXY IR/ROXICODONE) 5 MG immediate release tablet   Other Relevant Orders   KNEE INJECTION   GENICULAR NERVE BLOCK   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)   Relevant Medications   acetaminophen (TYLENOL) 500 MG tablet   baclofen (LIORESAL) 10 MG tablet   cyclobenzaprine (FLEXERIL) 10 MG tablet   oxyCODONE (OXY IR/ROXICODONE) 5 MG immediate release tablet   oxyCODONE (OXY IR/ROXICODONE) 5 MG immediate release tablet   oxyCODONE (OXY IR/ROXICODONE) 5 MG immediate release tablet   Other Relevant Orders   SACROILIAC JOINT INJECTINS     Medium   Encounter for therapeutic drug level monitoring   Long term current use of opiate analgesic (Chronic)   Relevant Orders   ToxASSURE Select 13 (MW), Urine     Low   Morbid obesity with body mass index (BMI) of 45.0 to 49.9 in adult [facility])   Osteopenia       Pharmacotherapy (Medications Ordered): Meds ordered this encounter  Medications  . gabapentin (NEURONTIN) 300 MG capsule    Sig: Take 1-3 capsules (300-900 mg total) by mouth 4 (four) times daily. Follow written titration schedule.    Dispense:  360 capsule     Refill:  2    Do not place this medication, or any other prescription from our practice, on "Automatic Refill". Patient may have prescription filled one day early if pharmacy is closed on scheduled refill date.  . baclofen (LIORESAL) 10 MG tablet    Sig: Take 1 tablet (10 mg total) by mouth at bedtime as needed for muscle spasms. Hold Flexeril when taking this medication.    Dispense:  30 tablet    Refill:  2    Do not place this medication, or any other prescription from our practice, on "Automatic Refill". Patient may have prescription filled one day early if pharmacy is closed on scheduled refill date.  . cyclobenzaprine (FLEXERIL) 10 MG tablet    Sig: Take 1 tablet (10 mg total) by mouth 3 (three) times daily as needed for muscle spasms.    Dispense:  90 tablet    Refill:  2    Do not add this medication to the electronic "Automatic Refill" notification system. Patient may have prescription filled one day early if pharmacy is closed on scheduled refill date.  Marland Kitchen oxyCODONE (OXY IR/ROXICODONE) 5 MG immediate release tablet    Sig: Take 1 tablet (5 mg total) by mouth every 6 (six) hours as needed for severe pain.    Dispense:  120 tablet    Refill:  0    Do not place this medication, or any other prescription from our practice, on "Automatic Refill". Patient may have prescription filled one day early if pharmacy is closed on  scheduled refill date. Do not fill until: 12/22/15 To last until: 01/21/16  . oxyCODONE (OXY IR/ROXICODONE) 5 MG immediate release tablet    Sig: Take 1 tablet (5 mg total) by mouth every 6 (six) hours as needed for severe pain.    Dispense:  120 tablet    Refill:  0    Do not place this medication, or any other prescription from our practice, on "Automatic Refill". Patient may have prescription filled one day early if pharmacy is closed on scheduled refill date. Do not fill until: 01/21/16 To last until: 02/20/16  . oxyCODONE (OXY IR/ROXICODONE) 5 MG immediate release  tablet    Sig: Take 1 tablet (5 mg total) by mouth every 6 (six) hours as needed for severe pain.    Dispense:  120 tablet    Refill:  0    Do not place this medication, or any other prescription from our practice, on "Automatic Refill". Patient may have prescription filled one day early if pharmacy is closed on scheduled refill date. Do not fill until: 02/20/16 To last until: 03/21/16    [facility] & Procedure Ordered: Orders Placed This Encounter  Procedures  . LUMBAR EPIDURAL STEROID INJECTION  . Lumbar Transforaminal epidural without steroid  . Lumbar Transforaminal epidural without steroid  . CERVICAL EPIDURAL STEROID INJECTION  . CERVICAL FACET (MEDIAL BRANCH NERVE BLOCK)   . KNEE INJECTION  . GENICULAR NERVE BLOCK  . LUMBAR FACET(MEDIAL BRANCH NERVE BLOCK) MBNB  . SACROILIAC JOINT INJECTINS  . HIP INJECTION  . SHOULDER INJECTION  . SUPRASCAPULAR NERVE BLOCK  . DG Shoulder Left  . DG Shoulder Right  . MR Cervical Spine Wo Contrast  . ToxASSURE Select 13 (MW), Urine    Imaging Ordered: DG SHOULDER LEFT DG SHOULDER RIGHT MR CERVICAL SPINE WO CONTRAST  Interventional Therapies: Scheduled:  None at this time.    Considering:  1. Left L4-5 lumbar epidural steroid injection #2 under fluoroscopic guidance, without IV sedation.  2. Left L5-S1 transforaminal epidural steroid injection under fluoroscopic guidance, with without sedation.  3. Right L4-5 transforaminal epidural steroid injection under fluoroscopic guidance, with or without sedation.  4. Left diagnostic cervical epidural steroid injection under fluoroscopic guidance, with or without sedation.  5. Diagnostic bilateral cervical facet block under fluoroscopic guidance and IV sedation. 6. Possible bilateral cervical facet radiofrequency ablation under fluoroscopic guidance and IV sedation. 7. Diagnostic bilateral intra-articular knee injection under fluoroscopic guidance, no sedation.  8. Diagnostic bilateral  genicular nerve block under fluoroscopic guidance with IV sedation.  9. Possible bilateral genicular nerve radiofrequency ablation under fluoroscopic guidance and IV sedation.  10. Diagnostic bilateral lumbar facet block under fluoroscopic guidance and IV sedation.  11. Possible bilateral lumbar facet radiofrequency ablation under fluoroscopic guidance and IV sedation.  12. Diagnostic bilateral sacroiliac joint block under fluoroscopic guidance, with or without sedation. 13. Possible bilateral sacroiliac joint radiofrequency ablation under fluoroscopic guidance and IV sedation. 14. Diagnostic bilateral intra-articular hip joint injection under fluoroscopic guidance, with or without sedation.  15. Possible bilateral hip radiofrequency ablation under fluoroscopic guidance and IV sedation.  16. Diagnostic bilateral intra-articular shoulder joint injection under fluoroscopic guidance, with or without sedation.  17. Diagnostic bilateral suprascapular nerve block under fluoroscopic guidance, with or without sedation.  18. Possible bilateral suprascapular nerve radiofrequency ablation under fluoroscopic guidance and IV sedation.    PRN Procedures:  1. Left L4-5 lumbar epidural steroid injection under fluoroscopic guidance, without IV sedation.  2. Left L5-S1 transforaminal epidural steroid injection under fluoroscopic  guidance, with without sedation.  3. Right L4-5 transforaminal epidural steroid injection under fluoroscopic guidance, with or without sedation.  4. Left diagnostic cervical epidural steroid injection under fluoroscopic guidance, with or without sedation.  5. Diagnostic bilateral cervical facet block under fluoroscopic guidance and IV sedation. 6. Diagnostic bilateral intra-articular knee injection under fluoroscopic guidance, no sedation.  7. Diagnostic bilateral genicular nerve block under fluoroscopic guidance with IV sedation.  8. Diagnostic bilateral lumbar facet  block under fluoroscopic guidance and IV sedation.  9. Diagnostic bilateral sacroiliac joint block under fluoroscopic guidance, with or without sedation. 10. Diagnostic bilateral intra-articular hip joint injection under fluoroscopic guidance, with or without sedation.  11. Diagnostic bilateral intra-articular shoulder joint injection under fluoroscopic guidance, with or without sedation.  12. Diagnostic bilateral suprascapular nerve block under fluoroscopic guidance, with or without sedation.        Referral(s) or Consult(s): None at this time.  New Prescriptions   No medications on file    Medications administered during this visit: Ms. Cioni had no medications administered during this visit.  Requested PM Follow-up: Return in 3 months (on 03/11/2016) for (3-Mo), Med-Mgmt, (PRN) Procedure, Post-test(s) Eval.  Future Appointments Date Time Provider [place]  03/11/2016 10:20 AM Milinda Pointer, MD [facility] None  05/10/2016 3:00 PM [facility]    Primary Care Physician: [place] Location: [facility] Note by: Kathlen Brunswick. Dossie Arbour, M.D, DABA, DABAPM, DABPM, DABIPP, FIPP  Pain Score Disclaimer: We use the NRS-11 scale. This is a self-reported, subjective measurement of pain severity with only modest accuracy. It is used primarily to identify changes within a particular patient. It must be understood that outpatient pain scales are significantly less accurate that those used for research, where they can be applied under ideal controlled circumstances with minimal exposure to variables. In reality, the score is likely to be a combination of pain intensity and pain affect, where pain affect describes the degree of emotional arousal or changes in action readiness caused by the sensory experience of pain. Factors such as social and work situation, setting, emotional state, anxiety levels, expectation, and prior pain  experience may influence pain perception and show large inter-individual differences that may also be affected by time variables.  Patient instructions provided during this appointment: There are no Patient Instructions on file for this visit.

## 2015-12-13 NOTE — Progress Notes (Signed)
Safety precautions to be maintained throughout the outpatient stay will include: orient to surroundings, keep bed in low position, maintain call bell within reach at all times, provide assistance with transfer out of bed and ambulation.  Pills remaining 10/120 oxycodone 5mg  filled 11/23/15 Depression scale HIGH- takes medication Review xrays

## 2015-12-15 DIAGNOSIS — M47818 Spondylosis without myelopathy or radiculopathy, sacral and sacrococcygeal region: Secondary | ICD-10-CM

## 2015-12-15 DIAGNOSIS — M533 Sacrococcygeal disorders, not elsewhere classified: Secondary | ICD-10-CM

## 2015-12-15 DIAGNOSIS — M461 Sacroiliitis, not elsewhere classified: Secondary | ICD-10-CM | POA: Insufficient documentation

## 2015-12-15 DIAGNOSIS — M8580 Other specified disorders of bone density and structure, unspecified site: Secondary | ICD-10-CM | POA: Insufficient documentation

## 2015-12-15 DIAGNOSIS — Z6841 Body Mass Index (BMI) 40.0 and over, adult: Secondary | ICD-10-CM

## 2015-12-15 DIAGNOSIS — G8929 Other chronic pain: Secondary | ICD-10-CM | POA: Insufficient documentation

## 2015-12-15 DIAGNOSIS — M1612 Unilateral primary osteoarthritis, left hip: Secondary | ICD-10-CM | POA: Insufficient documentation

## 2015-12-21 LAB — TOXASSURE SELECT 13 (MW), URINE: PDF: 0

## 2016-01-04 ENCOUNTER — Telehealth: Payer: Self-pay

## 2016-01-04 NOTE — Telephone Encounter (Signed)
Pt called and is having hip back and leg pain I do not know what to schedule her for. Pt is also having shoulder pain

## 2016-01-05 NOTE — Telephone Encounter (Signed)
Attempted to call patient to clarify her pain so that we could get prn procedure scheduled.  Left message for her to call.

## 2016-01-22 DIAGNOSIS — Z9989 Dependence on other enabling machines and devices: Secondary | ICD-10-CM

## 2016-01-22 HISTORY — DX: Dependence on other enabling machines and devices: Z99.89

## 2016-01-23 ENCOUNTER — Encounter: Payer: Self-pay | Admitting: Pain Medicine

## 2016-01-23 ENCOUNTER — Ambulatory Visit: Payer: Medicare Other | Attending: Pain Medicine | Admitting: Pain Medicine

## 2016-01-23 DIAGNOSIS — M47812 Spondylosis without myelopathy or radiculopathy, cervical region: Secondary | ICD-10-CM | POA: Insufficient documentation

## 2016-01-23 DIAGNOSIS — M25561 Pain in right knee: Secondary | ICD-10-CM | POA: Diagnosis not present

## 2016-01-23 DIAGNOSIS — G4733 Obstructive sleep apnea (adult) (pediatric): Secondary | ICD-10-CM | POA: Insufficient documentation

## 2016-01-23 DIAGNOSIS — M542 Cervicalgia: Secondary | ICD-10-CM | POA: Insufficient documentation

## 2016-01-23 DIAGNOSIS — F419 Anxiety disorder, unspecified: Secondary | ICD-10-CM | POA: Insufficient documentation

## 2016-01-23 DIAGNOSIS — J45909 Unspecified asthma, uncomplicated: Secondary | ICD-10-CM | POA: Insufficient documentation

## 2016-01-23 DIAGNOSIS — Z79891 Long term (current) use of opiate analgesic: Secondary | ICD-10-CM | POA: Diagnosis not present

## 2016-01-23 DIAGNOSIS — G43909 Migraine, unspecified, not intractable, without status migrainosus: Secondary | ICD-10-CM | POA: Insufficient documentation

## 2016-01-23 DIAGNOSIS — I10 Essential (primary) hypertension: Secondary | ICD-10-CM | POA: Diagnosis not present

## 2016-01-23 DIAGNOSIS — M25551 Pain in right hip: Secondary | ICD-10-CM | POA: Insufficient documentation

## 2016-01-23 DIAGNOSIS — M797 Fibromyalgia: Secondary | ICD-10-CM | POA: Insufficient documentation

## 2016-01-23 DIAGNOSIS — G8929 Other chronic pain: Secondary | ICD-10-CM | POA: Diagnosis not present

## 2016-01-23 DIAGNOSIS — M545 Low back pain: Secondary | ICD-10-CM | POA: Diagnosis not present

## 2016-01-23 DIAGNOSIS — M5416 Radiculopathy, lumbar region: Secondary | ICD-10-CM

## 2016-01-23 DIAGNOSIS — K219 Gastro-esophageal reflux disease without esophagitis: Secondary | ICD-10-CM | POA: Insufficient documentation

## 2016-01-23 DIAGNOSIS — M1712 Unilateral primary osteoarthritis, left knee: Secondary | ICD-10-CM | POA: Insufficient documentation

## 2016-01-23 DIAGNOSIS — M25562 Pain in left knee: Secondary | ICD-10-CM | POA: Insufficient documentation

## 2016-01-23 DIAGNOSIS — E785 Hyperlipidemia, unspecified: Secondary | ICD-10-CM | POA: Diagnosis not present

## 2016-01-23 DIAGNOSIS — M25552 Pain in left hip: Secondary | ICD-10-CM | POA: Diagnosis not present

## 2016-01-23 DIAGNOSIS — D126 Benign neoplasm of colon, unspecified: Secondary | ICD-10-CM | POA: Insufficient documentation

## 2016-01-23 DIAGNOSIS — E538 Deficiency of other specified B group vitamins: Secondary | ICD-10-CM | POA: Diagnosis not present

## 2016-01-23 DIAGNOSIS — K449 Diaphragmatic hernia without obstruction or gangrene: Secondary | ICD-10-CM | POA: Insufficient documentation

## 2016-01-23 DIAGNOSIS — R079 Chest pain, unspecified: Secondary | ICD-10-CM | POA: Insufficient documentation

## 2016-01-23 DIAGNOSIS — M533 Sacrococcygeal disorders, not elsewhere classified: Secondary | ICD-10-CM | POA: Diagnosis not present

## 2016-01-23 DIAGNOSIS — M8580 Other specified disorders of bone density and structure, unspecified site: Secondary | ICD-10-CM | POA: Diagnosis not present

## 2016-01-23 DIAGNOSIS — R32 Unspecified urinary incontinence: Secondary | ICD-10-CM | POA: Insufficient documentation

## 2016-01-23 DIAGNOSIS — G4730 Sleep apnea, unspecified: Secondary | ICD-10-CM | POA: Insufficient documentation

## 2016-01-23 DIAGNOSIS — M549 Dorsalgia, unspecified: Secondary | ICD-10-CM | POA: Diagnosis present

## 2016-01-23 DIAGNOSIS — E559 Vitamin D deficiency, unspecified: Secondary | ICD-10-CM | POA: Insufficient documentation

## 2016-01-23 DIAGNOSIS — Z8711 Personal history of peptic ulcer disease: Secondary | ICD-10-CM | POA: Insufficient documentation

## 2016-01-23 DIAGNOSIS — Z6841 Body Mass Index (BMI) 40.0 and over, adult: Secondary | ICD-10-CM | POA: Insufficient documentation

## 2016-01-23 DIAGNOSIS — M4726 Other spondylosis with radiculopathy, lumbar region: Secondary | ICD-10-CM | POA: Diagnosis not present

## 2016-01-23 DIAGNOSIS — F411 Generalized anxiety disorder: Secondary | ICD-10-CM | POA: Insufficient documentation

## 2016-01-23 DIAGNOSIS — H9190 Unspecified hearing loss, unspecified ear: Secondary | ICD-10-CM | POA: Insufficient documentation

## 2016-01-23 DIAGNOSIS — E039 Hypothyroidism, unspecified: Secondary | ICD-10-CM | POA: Insufficient documentation

## 2016-01-23 MED ORDER — TRIAMCINOLONE ACETONIDE 40 MG/ML IJ SUSP: 40.0000 mg | Freq: Once | INTRAMUSCULAR | Status: DC

## 2016-01-23 MED ORDER — ROPIVACAINE HCL 2 MG/ML IJ SOLN
INTRAMUSCULAR | Status: AC
  Filled 2016-01-23: qty 10

## 2016-01-23 MED ORDER — SODIUM CHLORIDE 0.9 % IJ SOLN
INTRAMUSCULAR | Status: AC
  Filled 2016-01-23: qty 10

## 2016-01-23 MED ORDER — ROPIVACAINE HCL 2 MG/ML IJ SOLN
2.0000 mL | Freq: Once | INTRAMUSCULAR | Status: DC
Start: 2016-01-23 — End: 2016-02-08

## 2016-01-23 MED ORDER — TRIAMCINOLONE ACETONIDE 40 MG/ML IJ SUSP
INTRAMUSCULAR | Status: AC
  Filled 2016-01-23: qty 1

## 2016-01-23 MED ORDER — LIDOCAINE HCL (PF) 1 % IJ SOLN
INTRAMUSCULAR | Status: AC
  Filled 2016-01-23: qty 5

## 2016-01-23 MED ORDER — LIDOCAINE HCL (PF) 1 % IJ SOLN: 10.0000 mL | Freq: Once | INTRAMUSCULAR | Status: DC

## 2016-01-23 MED ORDER — SODIUM CHLORIDE 0.9% FLUSH: 2.0000 mL | Freq: Once | INTRAVENOUS | Status: DC
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
## 2016-01-23 NOTE — Progress Notes (Signed)
Patient is here today for procedure d/t low back pain.  Patient states that she was supposed to have MRI scheduled but did not follow through.  MRI of back and xray of shoulders and patient states she is ready to schedule this now.  Right shoulder, c/o increased weakness   Safety precautions to be maintained throughout the outpatient stay will include: orient to surroundings, keep bed in low position, maintain call bell within reach at all times, provide assistance with transfer out of bed and ambulation.

## 2016-01-23 NOTE — Patient Instructions (Signed)

## 2016-01-24 ENCOUNTER — Telehealth: Payer: Self-pay | Admitting: *Deleted

## 2016-01-24 NOTE — Telephone Encounter (Signed)
Message left

## 2016-02-07 ENCOUNTER — Ambulatory Visit
Admission: RE | Admit: 2016-02-07 | Discharge: 2016-02-07 | Disposition: A | Discharge status: Home / Self Care | Payer: Medicare Other | Source: Ambulatory Visit | Attending: Pain Medicine | Admitting: Pain Medicine

## 2016-02-07 DIAGNOSIS — M19011 Primary osteoarthritis, right shoulder: Secondary | ICD-10-CM | POA: Diagnosis not present

## 2016-02-07 DIAGNOSIS — M25511 Pain in right shoulder: Secondary | ICD-10-CM | POA: Diagnosis not present

## 2016-02-07 DIAGNOSIS — M47812 Spondylosis without myelopathy or radiculopathy, cervical region: Secondary | ICD-10-CM

## 2016-02-07 DIAGNOSIS — M25512 Pain in left shoulder: Secondary | ICD-10-CM | POA: Insufficient documentation

## 2016-02-07 DIAGNOSIS — M19012 Primary osteoarthritis, left shoulder: Secondary | ICD-10-CM | POA: Insufficient documentation

## 2016-02-07 DIAGNOSIS — G8929 Other chronic pain: Secondary | ICD-10-CM | POA: Diagnosis present

## 2016-02-07 DIAGNOSIS — M4802 Spinal stenosis, cervical region: Secondary | ICD-10-CM | POA: Diagnosis not present

## 2016-02-07 DIAGNOSIS — M50221 Other cervical disc displacement at C4-C5 level: Secondary | ICD-10-CM | POA: Insufficient documentation

## 2016-02-08 ENCOUNTER — Ambulatory Visit: Payer: Medicare Other | Attending: Pain Medicine | Admitting: Pain Medicine

## 2016-02-08 ENCOUNTER — Encounter: Payer: Self-pay | Admitting: Pain Medicine

## 2016-02-08 VITALS — BP 159/79 | HR 79 | Temp 97.9°F | Resp 18 | Ht 67.0 in | Wt 300.0 lb

## 2016-02-08 DIAGNOSIS — M160 Bilateral primary osteoarthritis of hip: Secondary | ICD-10-CM | POA: Insufficient documentation

## 2016-02-08 DIAGNOSIS — M4726 Other spondylosis with radiculopathy, lumbar region: Secondary | ICD-10-CM | POA: Diagnosis not present

## 2016-02-08 DIAGNOSIS — G4700 Insomnia, unspecified: Secondary | ICD-10-CM | POA: Diagnosis not present

## 2016-02-08 DIAGNOSIS — M25552 Pain in left hip: Secondary | ICD-10-CM | POA: Insufficient documentation

## 2016-02-08 DIAGNOSIS — M79643 Pain in unspecified hand: Secondary | ICD-10-CM

## 2016-02-08 DIAGNOSIS — R32 Unspecified urinary incontinence: Secondary | ICD-10-CM | POA: Insufficient documentation

## 2016-02-08 DIAGNOSIS — M2578 Osteophyte, vertebrae: Secondary | ICD-10-CM | POA: Insufficient documentation

## 2016-02-08 DIAGNOSIS — Z87820 Personal history of traumatic brain injury: Secondary | ICD-10-CM | POA: Insufficient documentation

## 2016-02-08 DIAGNOSIS — J45909 Unspecified asthma, uncomplicated: Secondary | ICD-10-CM | POA: Insufficient documentation

## 2016-02-08 DIAGNOSIS — M1712 Unilateral primary osteoarthritis, left knee: Secondary | ICD-10-CM | POA: Insufficient documentation

## 2016-02-08 DIAGNOSIS — K219 Gastro-esophageal reflux disease without esophagitis: Secondary | ICD-10-CM | POA: Insufficient documentation

## 2016-02-08 DIAGNOSIS — M542 Cervicalgia: Secondary | ICD-10-CM | POA: Diagnosis present

## 2016-02-08 DIAGNOSIS — K449 Diaphragmatic hernia without obstruction or gangrene: Secondary | ICD-10-CM | POA: Diagnosis not present

## 2016-02-08 DIAGNOSIS — F411 Generalized anxiety disorder: Secondary | ICD-10-CM | POA: Insufficient documentation

## 2016-02-08 DIAGNOSIS — E538 Deficiency of other specified B group vitamins: Secondary | ICD-10-CM | POA: Diagnosis not present

## 2016-02-08 DIAGNOSIS — Z8711 Personal history of peptic ulcer disease: Secondary | ICD-10-CM | POA: Insufficient documentation

## 2016-02-08 DIAGNOSIS — M50221 Other cervical disc displacement at C4-C5 level: Secondary | ICD-10-CM | POA: Insufficient documentation

## 2016-02-08 DIAGNOSIS — M797 Fibromyalgia: Secondary | ICD-10-CM | POA: Insufficient documentation

## 2016-02-08 DIAGNOSIS — M25551 Pain in right hip: Secondary | ICD-10-CM | POA: Diagnosis not present

## 2016-02-08 DIAGNOSIS — Z79891 Long term (current) use of opiate analgesic: Secondary | ICD-10-CM | POA: Diagnosis not present

## 2016-02-08 DIAGNOSIS — E559 Vitamin D deficiency, unspecified: Secondary | ICD-10-CM | POA: Insufficient documentation

## 2016-02-08 DIAGNOSIS — I10 Essential (primary) hypertension: Secondary | ICD-10-CM | POA: Insufficient documentation

## 2016-02-08 DIAGNOSIS — F329 Major depressive disorder, single episode, unspecified: Secondary | ICD-10-CM | POA: Insufficient documentation

## 2016-02-08 DIAGNOSIS — E785 Hyperlipidemia, unspecified: Secondary | ICD-10-CM | POA: Diagnosis not present

## 2016-02-08 DIAGNOSIS — M79601 Pain in right arm: Secondary | ICD-10-CM | POA: Diagnosis present

## 2016-02-08 DIAGNOSIS — G43909 Migraine, unspecified, not intractable, without status migrainosus: Secondary | ICD-10-CM | POA: Diagnosis not present

## 2016-02-08 DIAGNOSIS — M4802 Spinal stenosis, cervical region: Secondary | ICD-10-CM | POA: Insufficient documentation

## 2016-02-08 DIAGNOSIS — M47812 Spondylosis without myelopathy or radiculopathy, cervical region: Secondary | ICD-10-CM | POA: Insufficient documentation

## 2016-02-08 DIAGNOSIS — G8929 Other chronic pain: Secondary | ICD-10-CM | POA: Insufficient documentation

## 2016-02-08 DIAGNOSIS — M8580 Other specified disorders of bone density and structure, unspecified site: Secondary | ICD-10-CM | POA: Insufficient documentation

## 2016-02-08 DIAGNOSIS — Z87891 Personal history of nicotine dependence: Secondary | ICD-10-CM | POA: Insufficient documentation

## 2016-02-08 DIAGNOSIS — G4733 Obstructive sleep apnea (adult) (pediatric): Secondary | ICD-10-CM | POA: Diagnosis not present

## 2016-02-08 DIAGNOSIS — M545 Low back pain: Secondary | ICD-10-CM | POA: Diagnosis present

## 2016-02-08 DIAGNOSIS — D126 Benign neoplasm of colon, unspecified: Secondary | ICD-10-CM | POA: Diagnosis not present

## 2016-02-08 DIAGNOSIS — E039 Hypothyroidism, unspecified: Secondary | ICD-10-CM | POA: Insufficient documentation

## 2016-02-08 DIAGNOSIS — Z6841 Body Mass Index (BMI) 40.0 and over, adult: Secondary | ICD-10-CM | POA: Insufficient documentation

## 2016-02-08 NOTE — Progress Notes (Signed)
Safety precautions to be maintained throughout the outpatient stay will include: orient to surroundings, keep bed in low position, maintain call bell within reach at all times, provide assistance with transfer out of bed and ambulation.  
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
## 2016-03-11 ENCOUNTER — Encounter: Payer: Medicare Other | Admitting: Pain Medicine

## 2016-04-23 ENCOUNTER — Ambulatory Visit: Payer: Medicare Other | Attending: Pain Medicine | Admitting: Pain Medicine

## 2016-04-23 ENCOUNTER — Encounter: Payer: Self-pay | Admitting: Pain Medicine

## 2016-04-23 VITALS — BP 143/81 | HR 84 | Temp 98.2°F | Resp 16 | Ht 67.5 in | Wt 318.0 lb

## 2016-04-23 DIAGNOSIS — G894 Chronic pain syndrome: Secondary | ICD-10-CM | POA: Diagnosis present

## 2016-04-23 DIAGNOSIS — M25511 Pain in right shoulder: Secondary | ICD-10-CM | POA: Insufficient documentation

## 2016-04-23 DIAGNOSIS — E559 Vitamin D deficiency, unspecified: Secondary | ICD-10-CM | POA: Insufficient documentation

## 2016-04-23 DIAGNOSIS — F411 Generalized anxiety disorder: Secondary | ICD-10-CM | POA: Insufficient documentation

## 2016-04-23 DIAGNOSIS — M79606 Pain in leg, unspecified: Secondary | ICD-10-CM

## 2016-04-23 DIAGNOSIS — M791 Myalgia: Secondary | ICD-10-CM

## 2016-04-23 DIAGNOSIS — Z6841 Body Mass Index (BMI) 40.0 and over, adult: Secondary | ICD-10-CM | POA: Insufficient documentation

## 2016-04-23 DIAGNOSIS — F329 Major depressive disorder, single episode, unspecified: Secondary | ICD-10-CM | POA: Insufficient documentation

## 2016-04-23 DIAGNOSIS — K449 Diaphragmatic hernia without obstruction or gangrene: Secondary | ICD-10-CM | POA: Insufficient documentation

## 2016-04-23 DIAGNOSIS — E538 Deficiency of other specified B group vitamins: Secondary | ICD-10-CM | POA: Insufficient documentation

## 2016-04-23 DIAGNOSIS — M797 Fibromyalgia: Secondary | ICD-10-CM | POA: Diagnosis not present

## 2016-04-23 DIAGNOSIS — M792 Neuralgia and neuritis, unspecified: Secondary | ICD-10-CM | POA: Diagnosis not present

## 2016-04-23 DIAGNOSIS — G8929 Other chronic pain: Secondary | ICD-10-CM | POA: Diagnosis not present

## 2016-04-23 DIAGNOSIS — R0789 Other chest pain: Secondary | ICD-10-CM | POA: Diagnosis not present

## 2016-04-23 DIAGNOSIS — J45909 Unspecified asthma, uncomplicated: Secondary | ICD-10-CM | POA: Diagnosis not present

## 2016-04-23 DIAGNOSIS — K219 Gastro-esophageal reflux disease without esophagitis: Secondary | ICD-10-CM | POA: Insufficient documentation

## 2016-04-23 DIAGNOSIS — R252 Cramp and spasm: Secondary | ICD-10-CM | POA: Diagnosis not present

## 2016-04-23 DIAGNOSIS — M25512 Pain in left shoulder: Secondary | ICD-10-CM | POA: Diagnosis not present

## 2016-04-23 DIAGNOSIS — Z79891 Long term (current) use of opiate analgesic: Secondary | ICD-10-CM | POA: Diagnosis not present

## 2016-04-23 DIAGNOSIS — M545 Low back pain: Secondary | ICD-10-CM | POA: Diagnosis not present

## 2016-04-23 DIAGNOSIS — G4733 Obstructive sleep apnea (adult) (pediatric): Secondary | ICD-10-CM | POA: Insufficient documentation

## 2016-04-23 DIAGNOSIS — E785 Hyperlipidemia, unspecified: Secondary | ICD-10-CM | POA: Insufficient documentation

## 2016-04-23 DIAGNOSIS — F1190 Opioid use, unspecified, uncomplicated: Secondary | ICD-10-CM

## 2016-04-23 DIAGNOSIS — I10 Essential (primary) hypertension: Secondary | ICD-10-CM | POA: Insufficient documentation

## 2016-04-23 DIAGNOSIS — M7918 Myalgia, other site: Secondary | ICD-10-CM

## 2016-04-23 MED ORDER — CYCLOBENZAPRINE HCL 10 MG PO TABS: 10.0000 mg | ORAL_TABLET | Freq: Three times a day (TID) | ORAL | 2 refills | Status: DC | PRN

## 2016-04-23 MED ORDER — OXYCODONE HCL 5 MG PO TABS: 5.0000 mg | ORAL_TABLET | Freq: Four times a day (QID) | ORAL | 0 refills | Status: DC | PRN

## 2016-04-23 MED ORDER — GABAPENTIN 300 MG PO CAPS: 300.0000 mg | ORAL_CAPSULE | Freq: Four times a day (QID) | ORAL | 2 refills | Status: DC

## 2016-04-23 MED ORDER — BACLOFEN 10 MG PO TABS: 10.0000 mg | ORAL_TABLET | Freq: Every evening | ORAL | 2 refills | Status: DC | PRN

## 2016-04-23 NOTE — Progress Notes (Signed)
Nursing Pain Medication Assessment:  Safety precautions to be maintained throughout the outpatient stay will include: orient to surroundings, keep bed in low position, maintain call bell within reach at all times, provide assistance with transfer out of bed and ambulation.  Medication Inspection Compliance: Kristina Gomez did not comply with our request to bring her pills to be counted. She was reminded that bringing the medication bottles, even when empty, is a requirement. Pill Count: No pills available to be counted today. Bottle Appearance: No container available. Did not bring bottle(s) to appointment. Medication: None brought in. Filled Date: N/A Medication last intake: 09/17

## 2016-04-23 NOTE — Patient Instructions (Addendum)
You were given 3 prescriptions for Oxycodone. Scripts for Baclofen, Cyclobenzaprine, and Gabapentin were e-scribed.

## 2016-04-23 NOTE — Progress Notes (Signed)
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 bilateral lumbar facet radiofrequencyablation under fluoroscopic guidance and IV sedation.  Diagnostic bilateral sacroiliac joint blockunder fluoroscopic guidance, with or without sedation. Possible bilateral sacroiliac joint radiofrequencyablation under fluoroscopic guidance and IV sedation. Diagnostic bilateralintra-articular hip joint injection under fluoroscopic guidance, with or without sedation.  Possible bilateral hip radiofrequency ablationunder fluoroscopic guidance and IV sedation.  Diagnostic bilateralintra-articular shoulder joint injectionunder fluoroscopic guidance, with or without sedation.  Diagnostic bilateral suprascapular nerve blockunder fluoroscopic guidance, with or without sedation.  Possible bilateral suprascapular nerve radiofrequencyablation under fluoroscopic guidance and IV sedation.    Palliative PRN treatment(s):   Left L4-5 lumbar epiduralsteroid injection under fluoroscopic guidance, without IV sedation.  Left L5-S1 transforaminalepidural  steroid injection under fluoroscopic guidance, with without sedation.  Right L4-5 transforaminalepidural steroid injection under fluoroscopic guidance, with or without sedation.  Left diagnostic cervical epiduralsteroid injection under fluoroscopic guidance, with or without sedation.  Diagnostic bilateral cervical facet blockunder fluoroscopic guidance and IV sedation. Diagnostic bilateralintra-articular knee injectionunder fluoroscopic guidance, no sedation.  Diagnostic bilateral genicular nerve blockunder fluoroscopic guidance with IV sedation.  Diagnostic bilateral lumbar facet blockunder fluoroscopic guidance and IV sedation.  Diagnostic bilateral sacroiliac joint blockunder fluoroscopic guidance, with or without sedation. Diagnostic bilateralintra-articular hip joint injection under fluoroscopic guidance, with or without sedation.  Diagnostic bilateralintra-articular shoulder joint injectionunder fluoroscopic guidance, with or without sedation. Diagnostic bilateral suprascapular nerve blockunder fluoroscopic guidance, with or without sedation.   Provider-requested follow-up: Return in about 3 months (around  07/24/2016) for Med-Mgmt.  Future Appointments Date Time Provider [place]  05/10/2016 3:00 PM [facility]  07/23/2016 8:15 AM Milinda Pointer, MD [facility] None   Primary Care Physician: [facility] Location: [facility] Note by: Kathlen Brunswick. Dossie Arbour, M.D, DABA, DABAPM, DABPM, DABIPP, FIPP  Pain Score Disclaimer: We use the NRS-11 scale. This is a self-reported, subjective measurement of pain severity with only modest accuracy. It is used primarily to identify changes within a particular patient. It must be understood that outpatient pain scales are significantly less accurate that those used for research, where they can be applied under ideal controlled circumstances with minimal exposure to  variables. In reality, the score is likely to be a combination of pain intensity and pain affect, where pain affect describes the degree of emotional arousal or changes in action readiness caused by the sensory experience of pain. Factors such as social and work situation, setting, emotional state, anxiety levels, expectation, and prior pain experience may influence pain perception and show large inter-individual differences that may also be affected by time variables.  Patient instructions provided during this appointment: Patient Instructions  You were given 3 prescriptions for Oxycodone. Scripts for Baclofen, Cyclobenzaprine, and Gabapentin were e-scribed.

## 2016-05-10 ENCOUNTER — Ambulatory Visit: Payer: Medicare Other

## 2016-07-17 DIAGNOSIS — R12 Heartburn: Secondary | ICD-10-CM | POA: Insufficient documentation

## 2016-07-18 DIAGNOSIS — Z9989 Dependence on other enabling machines and devices: Secondary | ICD-10-CM | POA: Insufficient documentation

## 2016-07-23 ENCOUNTER — Ambulatory Visit
Admission: RE | Admit: 2016-07-23 | Discharge: 2016-07-23 | Disposition: A | Discharge status: Home / Self Care | Payer: Medicare Other | Source: Ambulatory Visit | Attending: Pain Medicine | Admitting: Pain Medicine

## 2016-07-23 ENCOUNTER — Other Ambulatory Visit: Payer: Self-pay | Admitting: Family Medicine

## 2016-07-23 ENCOUNTER — Ambulatory Visit: Payer: Medicare Other | Attending: Pain Medicine | Admitting: Pain Medicine

## 2016-07-23 ENCOUNTER — Telehealth: Payer: Self-pay

## 2016-07-23 ENCOUNTER — Encounter: Payer: Self-pay | Admitting: Pain Medicine

## 2016-07-23 ENCOUNTER — Other Ambulatory Visit
Admission: RE | Admit: 2016-07-23 | Discharge: 2016-07-23 | Disposition: A | Discharge status: Home / Self Care | Payer: Medicare Other | Source: Ambulatory Visit | Attending: Pain Medicine | Admitting: Pain Medicine

## 2016-07-23 VITALS — BP 138/82 | HR 88 | Temp 98.0°F | Resp 18 | Ht 67.0 in | Wt 311.0 lb

## 2016-07-23 DIAGNOSIS — F1190 Opioid use, unspecified, uncomplicated: Secondary | ICD-10-CM

## 2016-07-23 DIAGNOSIS — N644 Mastodynia: Secondary | ICD-10-CM

## 2016-07-23 DIAGNOSIS — M7918 Myalgia, other site: Secondary | ICD-10-CM

## 2016-07-23 DIAGNOSIS — M25511 Pain in right shoulder: Secondary | ICD-10-CM | POA: Diagnosis not present

## 2016-07-23 DIAGNOSIS — E785 Hyperlipidemia, unspecified: Secondary | ICD-10-CM | POA: Diagnosis not present

## 2016-07-23 DIAGNOSIS — G894 Chronic pain syndrome: Secondary | ICD-10-CM | POA: Diagnosis present

## 2016-07-23 DIAGNOSIS — G4733 Obstructive sleep apnea (adult) (pediatric): Secondary | ICD-10-CM | POA: Diagnosis not present

## 2016-07-23 DIAGNOSIS — E538 Deficiency of other specified B group vitamins: Secondary | ICD-10-CM | POA: Insufficient documentation

## 2016-07-23 DIAGNOSIS — M79605 Pain in left leg: Secondary | ICD-10-CM

## 2016-07-23 DIAGNOSIS — R0789 Other chest pain: Secondary | ICD-10-CM | POA: Insufficient documentation

## 2016-07-23 DIAGNOSIS — M549 Dorsalgia, unspecified: Secondary | ICD-10-CM | POA: Diagnosis not present

## 2016-07-23 DIAGNOSIS — M47812 Spondylosis without myelopathy or radiculopathy, cervical region: Secondary | ICD-10-CM | POA: Diagnosis not present

## 2016-07-23 DIAGNOSIS — M79604 Pain in right leg: Secondary | ICD-10-CM

## 2016-07-23 DIAGNOSIS — K449 Diaphragmatic hernia without obstruction or gangrene: Secondary | ICD-10-CM | POA: Diagnosis not present

## 2016-07-23 DIAGNOSIS — F411 Generalized anxiety disorder: Secondary | ICD-10-CM | POA: Diagnosis not present

## 2016-07-23 DIAGNOSIS — M797 Fibromyalgia: Secondary | ICD-10-CM | POA: Insufficient documentation

## 2016-07-23 DIAGNOSIS — R252 Cramp and spasm: Secondary | ICD-10-CM | POA: Diagnosis not present

## 2016-07-23 DIAGNOSIS — M79672 Pain in left foot: Secondary | ICD-10-CM | POA: Insufficient documentation

## 2016-07-23 DIAGNOSIS — R209 Unspecified disturbances of skin sensation: Secondary | ICD-10-CM | POA: Insufficient documentation

## 2016-07-23 DIAGNOSIS — Z6841 Body Mass Index (BMI) 40.0 and over, adult: Secondary | ICD-10-CM | POA: Insufficient documentation

## 2016-07-23 DIAGNOSIS — M7732 Calcaneal spur, left foot: Secondary | ICD-10-CM | POA: Insufficient documentation

## 2016-07-23 DIAGNOSIS — Z87820 Personal history of traumatic brain injury: Secondary | ICD-10-CM | POA: Insufficient documentation

## 2016-07-23 DIAGNOSIS — J45909 Unspecified asthma, uncomplicated: Secondary | ICD-10-CM | POA: Insufficient documentation

## 2016-07-23 DIAGNOSIS — M5416 Radiculopathy, lumbar region: Secondary | ICD-10-CM

## 2016-07-23 DIAGNOSIS — M79641 Pain in right hand: Secondary | ICD-10-CM | POA: Insufficient documentation

## 2016-07-23 DIAGNOSIS — Z79891 Long term (current) use of opiate analgesic: Secondary | ICD-10-CM

## 2016-07-23 DIAGNOSIS — F329 Major depressive disorder, single episode, unspecified: Secondary | ICD-10-CM | POA: Diagnosis not present

## 2016-07-23 DIAGNOSIS — M25552 Pain in left hip: Secondary | ICD-10-CM

## 2016-07-23 DIAGNOSIS — E559 Vitamin D deficiency, unspecified: Secondary | ICD-10-CM | POA: Diagnosis not present

## 2016-07-23 DIAGNOSIS — M25512 Pain in left shoulder: Secondary | ICD-10-CM | POA: Diagnosis not present

## 2016-07-23 DIAGNOSIS — M79673 Pain in unspecified foot: Principal | ICD-10-CM

## 2016-07-23 DIAGNOSIS — K219 Gastro-esophageal reflux disease without esophagitis: Secondary | ICD-10-CM | POA: Diagnosis not present

## 2016-07-23 DIAGNOSIS — M1288 Other specific arthropathies, not elsewhere classified, other specified site: Secondary | ICD-10-CM

## 2016-07-23 DIAGNOSIS — G4700 Insomnia, unspecified: Secondary | ICD-10-CM | POA: Diagnosis not present

## 2016-07-23 DIAGNOSIS — M79671 Pain in right foot: Secondary | ICD-10-CM | POA: Insufficient documentation

## 2016-07-23 DIAGNOSIS — G8929 Other chronic pain: Secondary | ICD-10-CM

## 2016-07-23 DIAGNOSIS — M791 Myalgia: Secondary | ICD-10-CM

## 2016-07-23 DIAGNOSIS — M545 Low back pain, unspecified: Secondary | ICD-10-CM

## 2016-07-23 DIAGNOSIS — M792 Neuralgia and neuritis, unspecified: Secondary | ICD-10-CM

## 2016-07-23 DIAGNOSIS — Z87891 Personal history of nicotine dependence: Secondary | ICD-10-CM | POA: Insufficient documentation

## 2016-07-23 LAB — COMPREHENSIVE METABOLIC PANEL
ALK PHOS: 71 U/L (ref 38–126)
ALT: 43 U/L (ref 14–54)
AST: 30 U/L (ref 15–41)
Albumin: 4.2 g/dL (ref 3.5–5.0)
Anion gap: 8 (ref 5–15)
BILIRUBIN TOTAL: 0.8 mg/dL (ref 0.3–1.2)
BUN: 19 mg/dL (ref 6–20)
CO2: 28 mmol/L (ref 22–32)
Calcium: 9.6 mg/dL (ref 8.9–10.3)
Chloride: 102 mmol/L (ref 101–111)
Creatinine, Ser: 0.97 mg/dL (ref 0.44–1.00)
GFR calc Af Amer: >60 mL/min (ref >60)
GFR calc non Af Amer: >60 mL/min (ref >60)
GLUCOSE: 99 mg/dL (ref 65–99)
POTASSIUM: 3.6 mmol/L (ref 3.5–5.1)
Sodium: 138 mmol/L (ref 135–145)
TOTAL PROTEIN: 7.5 g/dL (ref 6.5–8.1)

## 2016-07-23 LAB — VITAMIN B12: VITAMIN B 12: 398 pg/mL (ref 180–914)

## 2016-07-23 LAB — C-REACTIVE PROTEIN: CRP: <0.8 mg/dL (ref <1.0)

## 2016-07-23 LAB — MAGNESIUM: Magnesium: 2 mg/dL (ref 1.7–2.4)

## 2016-07-23 LAB — SEDIMENTATION RATE: Sed Rate: 11 mm/hr (ref 0–30)

## 2016-07-23 MED ORDER — CYCLOBENZAPRINE HCL 10 MG PO TABS: 10.0000 mg | ORAL_TABLET | Freq: Three times a day (TID) | ORAL | 2 refills | Status: DC | PRN

## 2016-07-23 MED ORDER — OXYCODONE HCL 5 MG PO TABS: 5.0000 mg | ORAL_TABLET | Freq: Four times a day (QID) | ORAL | 0 refills | Status: DC | PRN

## 2016-07-23 MED ORDER — GABAPENTIN 300 MG PO CAPS: 300.0000 mg | ORAL_CAPSULE | Freq: Four times a day (QID) | ORAL | 2 refills | Status: DC

## 2016-07-23 MED ORDER — GABAPENTIN 300 MG PO CAPS: 300.0000 mg | ORAL_CAPSULE | Freq: Three times a day (TID) | ORAL | 2 refills | Status: DC

## 2016-07-23 MED ORDER — BACLOFEN 10 MG PO TABS: 10.0000 mg | ORAL_TABLET | Freq: Every evening | ORAL | 2 refills | Status: DC | PRN

## 2016-07-23 NOTE — Progress Notes (Signed)
Nursing Pain Medication Assessment:  Safety precautions to be maintained throughout the outpatient stay will include: orient to surroundings, keep bed in low position, maintain call bell within reach at all times, provide assistance with transfer out of bed and ambulation.  Medication Inspection Compliance: Pill count conducted under aseptic conditions, in front of the patient. Neither the pills nor the bottle was removed from the patient's sight at any time. Once count was completed pills were immediately returned to the patient in their original bottle.  Medication: Oxycodone IR Pill/Patch Count: 0 of 120 pills remain Bottle Appearance: Standard pharmacy container. Clearly labeled. Filled Date: 01 /13/ 2018 Last Medication intake:  Today

## 2016-07-23 NOTE — Telephone Encounter (Signed)
Attempted to  Call patient to find out exactly how many Gabapentin 300 mg the patient is taking per day.  Insurance will only pay for 6 per day.  Possibly we could increase the dosage and decrease the pills per day.  Left message for her to call us back.

## 2016-07-23 NOTE — Progress Notes (Signed)
Nursing Pain Medication Assessment:  Safety precautions to be maintained throughout the outpatient stay will include: orient to surroundings, keep bed in low position, maintain call bell within reach at all times, provide assistance with transfer out of bed and ambulation.  Medication Inspection Compliance: Kristina Gomez did not comply with our request to bring her pills to be counted. She was reminded that bringing the medication bottles, even when empty, is a requirement. Pill/Patch Count: None available to be counted. Bottle Appearance: No container available. Did not bring bottle(s) to appointment. Medication: None brought in. Filled Date: N/A Last Medication intake:  Today

## 2016-07-23 NOTE — Patient Instructions (Addendum)
Patient returned with empty pill bottle for count.  Script given for oxycodone x 3    Instructed to get labs and xrays done in the medical mall.  Gabapentin, Flexeril and Baclofen have been escribed to your pharmacy and can be picked up.  You must bring back your empty pill bottle for count purposes.   Pain Score  Introduction: The pain score used by this practice is the Verbal Numerical Rating Scale (VNRS-11). This is an 11-point scale. It is for adults and children 10 years or older. There are significant differences in how the pain score is reported, used, and applied. Forget everything you learned in the past and learn this scoring system.  General Information: The scale should reflect your current level of pain. Unless you are specifically asked for the level of your worst pain, or your average pain. If you are asked for one of these two, then it should be understood that it is over the past 24 hours.  Basic Activities of Daily Living (ADL): Personal hygiene, dressing, eating, transferring, and using restroom.  Instructions: Most patients tend to report their level of pain as a combination of two factors, their physical pain and their psychosocial pain. This last one is also known as "suffering" and it is reflection of how physical pain affects you socially and psychologically. From now on, report them separately. From this point on, when asked to report your pain level, report only your physical pain. Use the following table for reference.  Pain Clinic Pain Levels (0-5/10)  Pain Level Score Description  No Pain 0   Mild pain 1 Nagging, annoying, but does not interfere with basic activities of daily living (ADL). Patients are able to eat, bathe, get dressed, toileting (being able to get on and off the toilet and perform personal hygiene functions), transfer (move in and out of bed or a chair without assistance), and maintain continence (able to control bladder and bowel functions). Blood  pressure and heart rate are unaffected. A normal heart rate for a healthy adult ranges from 60 to 100 bpm (beats per minute).   Mild to moderate pain 2 Noticeable and distracting. Impossible to hide from other people. More frequent flare-ups. Still possible to adapt and function close to normal. It can be very annoying and may have occasional stronger flare-ups. With discipline, patients may get used to it and adapt.   Moderate pain 3 Interferes significantly with activities of daily living (ADL). It becomes difficult to feed, bathe, get dressed, get on and off the toilet or to perform personal hygiene functions. Difficult to get in and out of bed or a chair without assistance. Very distracting. With effort, it can be ignored when deeply involved in activities.   Moderately severe pain 4 Impossible to ignore for more than a few minutes. With effort, patients may still be able to manage work or participate in some social activities. Very difficult to concentrate. Signs of autonomic nervous system discharge are evident: dilated pupils (mydriasis); mild sweating (diaphoresis); sleep interference. Heart rate becomes elevated (>115 bpm). Diastolic blood pressure (lower number) rises above 100 mmHg. Patients find relief in laying down and not moving.   Severe pain 5 Intense and extremely unpleasant. Associated with frowning face and frequent crying. Pain overwhelms the senses.  Ability to do any activity or maintain social relationships becomes significantly limited. Conversation becomes difficult. Pacing back and forth is common, as getting into a comfortable position is nearly impossible. Pain wakes you up from deep sleep.  Physical signs will be obvious: pupillary dilation; increased sweating; goosebumps; brisk reflexes; cold, clammy hands and feet; nausea, vomiting or dry heaves; loss of appetite; significant sleep disturbance with inability to fall asleep or to remain asleep. When persistent, significant  weight loss is observed due to the complete loss of appetite and sleep deprivation.  Blood pressure and heart rate becomes significantly elevated. Caution: If elevated blood pressure triggers a pounding headache associated with blurred vision, then the patient should immediately seek attention at an urgent or emergency care unit, as these may be signs of an impending stroke.    Emergency Department Pain Levels (6-10/10)  Emergency Room Pain 6 Severely limiting. Requires emergency care and should not be seen or managed at an outpatient pain management facility. Communication becomes difficult and requires great effort. Assistance to reach the emergency department may be required. Facial flushing and profuse sweating along with potentially dangerous increases in heart rate and blood pressure will be evident.   Distressing pain 7 Self-care is very difficult. Assistance is required to transport, or use restroom. Assistance to reach the emergency department will be required. Tasks requiring coordination, such as bathing and getting dressed become very difficult.   Disabling pain 8 Self-care is no longer possible. At this level, pain is disabling. The individual is unable to do even the most "basic" activities such as walking, eating, bathing, dressing, transferring to a bed, or toileting. Fine motor skills are lost. It is difficult to think clearly.   Incapacitating pain 9 Pain becomes incapacitating. Thought processing is no longer possible. Difficult to remember your own name. Control of movement and coordination are lost.   The worst pain imaginable 10 At this level, most patients pass out from pain. When this level is reached, collapse of the autonomic nervous system occurs, leading to a sudden drop in blood pressure and heart rate. This in turn results in a temporary and dramatic drop in blood flow to the brain, leading to a loss of consciousness. Fainting is one of the body's self defense mechanisms.  Passing out puts the brain in a calmed state and causes it to shut down for a while, in order to begin the healing process.    Summary: 1. Refer to this scale when providing [place] with your pain level. 2. Be accurate and careful when reporting your pain level. This will help with your care. 3. Over-reporting your pain level will lead to loss of credibility. 4. Even a level of 1/10 means that there is pain and will be treated at our facility. 5. High, inaccurate reporting will be documented as "Symptom Exaggeration", leading to loss of credibility and suspicions of possible secondary gains such as obtaining more narcotics, or wanting to appear disabled, for fraudulent reasons. 6. Only pain levels of 5 or below will be seen at our facility. 7. Pain levels of 6 and above will be sent to the Emergency Department and the appointment cancelled.

## 2016-07-23 NOTE — Progress Notes (Signed)
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 immediate release tablet    Sig: Take 1 tablet (5 mg total) by mouth every 6 (six) hours as needed for severe pain.    Dispense:  120 tablet    Refill:  0    Do not place this medication, or any other prescription from our practice, on "Automatic Refill". Patient may have prescription filled one day early if pharmacy is closed on scheduled refill date. Do not fill until: 09/21/16 To last until: 10/21/16  . gabapentin (NEURONTIN) 300 MG capsule    Sig: Take 1-2 capsules (300-600 mg total) by mouth 3 (three) times daily. Follow written titration schedule.    Dispense:  180 capsule    Refill:  2    Do not place this medication, or any other prescription from our practice, on "Automatic Refill". Patient may have prescription filled one day early if pharmacy is closed on scheduled refill date.   New Prescriptions   No medications on file   Medications administered today: Ms. Buell had no medications administered during this visit. Lab-work, procedure(s), and/or referral(s): Orders Placed This Encounter  Procedures  . DG Foot Complete Left  . DG Foot Complete  Right  . ToxASSURE Select 13 (MW),  Urine  . Comprehensive metabolic panel  . C-reactive protein  . Magnesium  . Sedimentation rate  . Vitamin B12  . 25-Hydroxyvitamin D Lcms D2+D3  . Ambulatory referral to Internal Medicine   Imaging and/or referral(s): AMB REFERRAL TO INTERNAL MEDICINE  Interventional therapies: Planned, scheduled, and/or pending:   Left cervical epidural steroid injection under fluoroscopic guidance, with a without sedation   Considering:   Left L4-5 lumbar epiduralsteroid injection #2 Left L5-S1 transforaminalepidural steroid injection Right L4-5 transforaminalepidural steroid injection Left diagnostic cervical epiduralsteroid injection Diagnostic bilateral cervical facet block Possible bilateral cervical facet radiofrequencyablation Diagnostic bilateralintra-articular knee injection Diagnostic bilateral genicular nerve block Possible bilateral genicular nerve radiofrequencyablation Diagnostic bilateral lumbar facet block Possible bilateral lumbar facet radiofrequencyablation Diagnostic bilateral sacroiliac joint block Possible bilateral sacroiliac joint radiofrequencyablation Diagnostic bilateralintra-articular hip joint injection Possible bilateral hip radiofrequency ablation Diagnostic bilateralintra-articular shoulder joint injection Diagnostic bilateral suprascapular nerve block Possible bilateral suprascapular nerve radiofrequencyablation   Palliative PRN treatment(s):   Left L4-5 lumbar epiduralsteroid injection Left L5-S1 transforaminalepidural steroid injection Right L4-5 transforaminalepidural steroid injection Left diagnostic cervical epiduralsteroid injection Diagnostic bilateral cervical facet block Diagnostic bilateralintra-articular knee injection Diagnostic bilateral genicular nerve block Diagnostic bilateral lumbar facet block Diagnostic bilateral sacroiliac joint block Diagnostic bilateralintra-articular hip joint  injection Diagnostic bilateralintra-articular shoulder joint injection Diagnostic bilateral suprascapular nerve block   Provider-requested follow-up: Return in 3 months (on 10/10/2016) for (NP) Med-Mgmt.  No future appointments. Primary Care Physician: [facility] Location: [facility] [address] [facility] Note by: Kathlen Brunswick. Dossie Arbour, M.D, DABA, DABAPM, DABPM, DABIPP, FIPP Date: 07/23/2016; Time: 3:37 PM  Pain Score Disclaimer: We use the NRS-11 scale. This is a self-reported, subjective measurement of pain severity with only modest accuracy. It is used primarily to identify changes within a particular patient. It must be understood that outpatient pain scales are significantly less accurate that those used for research, where they can be applied under ideal controlled circumstances with minimal exposure to variables. In reality, the score is likely to be a combination of pain intensity and pain affect, where pain affect describes the degree of emotional arousal or changes in action readiness caused by the sensory experience of pain. Factors such as social and work situation, setting, emotional state, anxiety levels, expectation, and prior pain experience may influence pain perception and show large inter-individual differences that may also be affected by time variables.  Patient instructions provided during this appointment: Patient Instructions   Patient returned with empty pill bottle for count.  Script given for oxycodone x 3    Instructed to get labs and xrays done in the medical mall.  Gabapentin, Flexeril and Baclofen have been escribed to your pharmacy and can be picked up.  You must bring back your empty pill bottle for count purposes.   Pain Score  Introduction: The pain score used by this practice is the Verbal Numerical Rating Scale (VNRS-11). This is an 11-point scale. It is for adults and children 10 years or older. There are significant differences  in how the pain score is reported, used, and applied. Forget everything you learned in the past and learn this scoring system.  General Information: The scale should reflect your current level of pain. Unless you are specifically asked for the level of your worst pain, or your average pain. If you are asked for one of these two, then it should be understood that it is over the past 24 hours.  Basic Activities of Daily Living (ADL): Personal hygiene, dressing, eating, transferring,  and using restroom.  Instructions: Most patients tend to report their level of pain as a combination of two factors, their physical pain and their psychosocial pain. This last one is also known as "suffering" and it is reflection of how physical pain affects you socially and psychologically. From now on, report them separately. From this point on, when asked to report your pain level, report only your physical pain. Use the following table for reference.  Pain Clinic Pain Levels (0-5/10)  Pain Level Score Description  No Pain 0   Mild pain 1 Nagging, annoying, but does not interfere with basic activities of daily living (ADL). Patients are able to eat, bathe, get dressed, toileting (being able to get on and off the toilet and perform personal hygiene functions), transfer (move in and out of bed or a chair without assistance), and maintain continence (able to control bladder and bowel functions). Blood pressure and heart rate are unaffected. A normal heart rate for a healthy adult ranges from 60 to 100 bpm (beats per minute).   Mild to moderate pain 2 Noticeable and distracting. Impossible to hide from other people. More frequent flare-ups. Still possible to adapt and function close to normal. It can be very annoying and may have occasional stronger flare-ups. With discipline, patients may get used to it and adapt.   Moderate pain 3 Interferes significantly with activities of daily living (ADL). It becomes difficult to feed,  bathe, get dressed, get on and off the toilet or to perform personal hygiene functions. Difficult to get in and out of bed or a chair without assistance. Very distracting. With effort, it can be ignored when deeply involved in activities.   Moderately severe pain 4 Impossible to ignore for more than a few minutes. With effort, patients may still be able to manage work or participate in some social activities. Very difficult to concentrate. Signs of autonomic nervous system discharge are evident: dilated pupils (mydriasis); mild sweating (diaphoresis); sleep interference. Heart rate becomes elevated (>115 bpm). Diastolic blood pressure (lower number) rises above 100 mmHg. Patients find relief in laying down and not moving.   Severe pain 5 Intense and extremely unpleasant. Associated with frowning face and frequent crying. Pain overwhelms the senses.  Ability to do any activity or maintain social relationships becomes significantly limited. Conversation becomes difficult. Pacing back and forth is common, as getting into a comfortable position is nearly impossible. Pain wakes you up from deep sleep. Physical signs will be obvious: pupillary dilation; increased sweating; goosebumps; brisk reflexes; cold, clammy hands and feet; nausea, vomiting or dry heaves; loss of appetite; significant sleep disturbance with inability to fall asleep or to remain asleep. When persistent, significant weight loss is observed due to the complete loss of appetite and sleep deprivation.  Blood pressure and heart rate becomes significantly elevated. Caution: If elevated blood pressure triggers a pounding headache associated with blurred vision, then the patient should immediately seek attention at an urgent or emergency care unit, as these may be signs of an impending stroke.    Emergency Department Pain Levels (6-10/10)  Emergency Room Pain 6 Severely limiting. Requires emergency care and should not be seen or managed at an  outpatient pain management facility. Communication becomes difficult and requires great effort. Assistance to reach the emergency department may be required. Facial flushing and profuse sweating along with potentially dangerous increases in heart rate and blood pressure will be evident.   Distressing pain 7 Self-care is very difficult. Assistance is required to transport,  or use restroom. Assistance to reach the emergency department will be required. Tasks requiring coordination, such as bathing and getting dressed become very difficult.   Disabling pain 8 Self-care is no longer possible. At this level, pain is disabling. The individual is unable to do even the most "basic" activities such as walking, eating, bathing, dressing, transferring to a bed, or toileting. Fine motor skills are lost. It is difficult to think clearly.   Incapacitating pain 9 Pain becomes incapacitating. Thought processing is no longer possible. Difficult to remember your own name. Control of movement and coordination are lost.   The worst pain imaginable 10 At this level, most patients pass out from pain. When this level is reached, collapse of the autonomic nervous system occurs, leading to a sudden drop in blood pressure and heart rate. This in turn results in a temporary and dramatic drop in blood flow to the brain, leading to a loss of consciousness. Fainting is one of the body's self defense mechanisms. Passing out puts the brain in a calmed state and causes it to shut down for a while, in order to begin the healing process.    Summary: 1. Refer to this scale when providing [place] with your pain level. 2. Be accurate and careful when reporting your pain level. This will help with your care. 3. Over-reporting your pain level will lead to loss of credibility. 4. Even a level of 1/10 means that there is pain and will be treated at our facility. 5. High, inaccurate reporting will be documented as "Symptom Exaggeration", leading  to loss of credibility and suspicions of possible secondary gains such as obtaining more narcotics, or wanting to appear disabled, for fraudulent reasons. 6. Only pain levels of 5 or below will be seen at our facility. 7. Pain levels of 6 and above will be sent to the Emergency Department and the appointment cancelled.

## 2016-07-26 ENCOUNTER — Other Ambulatory Visit: Payer: Self-pay | Admitting: Family Medicine

## 2016-07-26 DIAGNOSIS — R2232 Localized swelling, mass and lump, left upper limb: Secondary | ICD-10-CM

## 2016-07-26 DIAGNOSIS — N63 Unspecified lump in unspecified breast: Secondary | ICD-10-CM

## 2016-07-27 LAB — 25-HYDROXY VITAMIN D LCMS D2+D3
25-Hydroxy, Vitamin D-2: 1.7 ng/mL
25-Hydroxy, Vitamin D-3: 30 ng/mL

## 2016-07-27 LAB — 25-HYDROXYVITAMIN D LCMS D2+D3: 25-HYDROXY, VITAMIN D: 32 ng/mL

## 2016-07-28 LAB — TOXASSURE SELECT 13 (MW), URINE

## 2016-08-13 ENCOUNTER — Other Ambulatory Visit: Payer: Self-pay | Admitting: Family Medicine

## 2016-08-13 DIAGNOSIS — R2232 Localized swelling, mass and lump, left upper limb: Secondary | ICD-10-CM

## 2016-08-13 DIAGNOSIS — N63 Unspecified lump in unspecified breast: Secondary | ICD-10-CM

## 2016-09-06 ENCOUNTER — Ambulatory Visit
Admission: RE | Admit: 2016-09-06 | Discharge: 2016-09-06 | Disposition: A | Discharge status: Home / Self Care | Payer: Medicare Other | Source: Ambulatory Visit | Attending: Family Medicine | Admitting: Family Medicine

## 2016-09-06 DIAGNOSIS — R2232 Localized swelling, mass and lump, left upper limb: Secondary | ICD-10-CM | POA: Insufficient documentation

## 2016-09-06 DIAGNOSIS — N63 Unspecified lump in unspecified breast: Secondary | ICD-10-CM

## 2016-09-06 DIAGNOSIS — N6320 Unspecified lump in the left breast, unspecified quadrant: Secondary | ICD-10-CM | POA: Diagnosis not present

## 2016-09-30 ENCOUNTER — Encounter: Payer: Self-pay | Admitting: Nurse Practitioner

## 2016-09-30 ENCOUNTER — Ambulatory Visit: Payer: Medicare Other | Attending: Nurse Practitioner | Admitting: Nurse Practitioner

## 2016-09-30 VITALS — BP 137/83 | HR 85 | Temp 97.8°F | Resp 16 | Ht 67.0 in | Wt 286.0 lb

## 2016-09-30 DIAGNOSIS — M79671 Pain in right foot: Secondary | ICD-10-CM | POA: Diagnosis not present

## 2016-09-30 DIAGNOSIS — F329 Major depressive disorder, single episode, unspecified: Secondary | ICD-10-CM | POA: Insufficient documentation

## 2016-09-30 DIAGNOSIS — R252 Cramp and spasm: Secondary | ICD-10-CM

## 2016-09-30 DIAGNOSIS — Z79891 Long term (current) use of opiate analgesic: Secondary | ICD-10-CM

## 2016-09-30 DIAGNOSIS — K449 Diaphragmatic hernia without obstruction or gangrene: Secondary | ICD-10-CM | POA: Diagnosis not present

## 2016-09-30 DIAGNOSIS — G4733 Obstructive sleep apnea (adult) (pediatric): Secondary | ICD-10-CM | POA: Diagnosis not present

## 2016-09-30 DIAGNOSIS — K219 Gastro-esophageal reflux disease without esophagitis: Secondary | ICD-10-CM | POA: Diagnosis not present

## 2016-09-30 DIAGNOSIS — E785 Hyperlipidemia, unspecified: Secondary | ICD-10-CM | POA: Diagnosis not present

## 2016-09-30 DIAGNOSIS — Z6841 Body Mass Index (BMI) 40.0 and over, adult: Secondary | ICD-10-CM | POA: Insufficient documentation

## 2016-09-30 DIAGNOSIS — E559 Vitamin D deficiency, unspecified: Secondary | ICD-10-CM | POA: Insufficient documentation

## 2016-09-30 DIAGNOSIS — E538 Deficiency of other specified B group vitamins: Secondary | ICD-10-CM | POA: Insufficient documentation

## 2016-09-30 DIAGNOSIS — I10 Essential (primary) hypertension: Secondary | ICD-10-CM | POA: Insufficient documentation

## 2016-09-30 DIAGNOSIS — M79601 Pain in right arm: Secondary | ICD-10-CM | POA: Insufficient documentation

## 2016-09-30 DIAGNOSIS — M79641 Pain in right hand: Secondary | ICD-10-CM | POA: Insufficient documentation

## 2016-09-30 DIAGNOSIS — J45909 Unspecified asthma, uncomplicated: Secondary | ICD-10-CM | POA: Diagnosis not present

## 2016-09-30 DIAGNOSIS — G894 Chronic pain syndrome: Secondary | ICD-10-CM

## 2016-09-30 DIAGNOSIS — M792 Neuralgia and neuritis, unspecified: Secondary | ICD-10-CM

## 2016-09-30 DIAGNOSIS — M542 Cervicalgia: Secondary | ICD-10-CM | POA: Insufficient documentation

## 2016-09-30 DIAGNOSIS — G4700 Insomnia, unspecified: Secondary | ICD-10-CM | POA: Insufficient documentation

## 2016-09-30 DIAGNOSIS — G8929 Other chronic pain: Secondary | ICD-10-CM

## 2016-09-30 DIAGNOSIS — M47816 Spondylosis without myelopathy or radiculopathy, lumbar region: Secondary | ICD-10-CM | POA: Diagnosis not present

## 2016-09-30 DIAGNOSIS — M79672 Pain in left foot: Secondary | ICD-10-CM | POA: Insufficient documentation

## 2016-09-30 DIAGNOSIS — M7918 Myalgia, other site: Secondary | ICD-10-CM

## 2016-09-30 DIAGNOSIS — Z87820 Personal history of traumatic brain injury: Secondary | ICD-10-CM | POA: Diagnosis not present

## 2016-09-30 DIAGNOSIS — M25511 Pain in right shoulder: Secondary | ICD-10-CM | POA: Diagnosis not present

## 2016-09-30 DIAGNOSIS — M797 Fibromyalgia: Secondary | ICD-10-CM | POA: Insufficient documentation

## 2016-09-30 DIAGNOSIS — R0789 Other chest pain: Secondary | ICD-10-CM | POA: Insufficient documentation

## 2016-09-30 DIAGNOSIS — M549 Dorsalgia, unspecified: Secondary | ICD-10-CM | POA: Diagnosis present

## 2016-09-30 DIAGNOSIS — F411 Generalized anxiety disorder: Secondary | ICD-10-CM | POA: Diagnosis not present

## 2016-09-30 DIAGNOSIS — M25512 Pain in left shoulder: Secondary | ICD-10-CM | POA: Diagnosis not present

## 2016-09-30 DIAGNOSIS — M79673 Pain in unspecified foot: Secondary | ICD-10-CM

## 2016-09-30 DIAGNOSIS — M5416 Radiculopathy, lumbar region: Secondary | ICD-10-CM

## 2016-09-30 DIAGNOSIS — M7732 Calcaneal spur, left foot: Secondary | ICD-10-CM

## 2016-09-30 DIAGNOSIS — M25561 Pain in right knee: Secondary | ICD-10-CM | POA: Insufficient documentation

## 2016-09-30 DIAGNOSIS — M791 Myalgia: Secondary | ICD-10-CM

## 2016-09-30 MED ORDER — CYCLOBENZAPRINE HCL 10 MG PO TABS: 10.0000 mg | ORAL_TABLET | Freq: Three times a day (TID) | ORAL | 2 refills | Status: DC | PRN

## 2016-09-30 MED ORDER — GABAPENTIN 300 MG PO CAPS: 300.0000 mg | ORAL_CAPSULE | Freq: Three times a day (TID) | ORAL | 2 refills | Status: DC

## 2016-09-30 MED ORDER — BACLOFEN 10 MG PO TABS: 10.0000 mg | ORAL_TABLET | Freq: Every evening | ORAL | 2 refills | Status: DC | PRN

## 2016-09-30 MED ORDER — OXYCODONE HCL 5 MG PO TABS: 5.0000 mg | ORAL_TABLET | Freq: Four times a day (QID) | ORAL | 0 refills | Status: DC | PRN

## 2016-09-30 NOTE — Patient Instructions (Signed)

## 2016-09-30 NOTE — Progress Notes (Signed)
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 Specific Question:   Supervising Provider    Answer:   Milinda Pointer [phone]  . oxyCODONE (OXY IR/ROXICODONE) 5 MG immediate release tablet    Sig: Take 1 tablet (5 mg total) by mouth every 6 (six) hours as needed for severe pain.    Dispense:  120 tablet    Refill:  0    Do not place this medication, or any other prescription from our practice, on "Automatic Refill". Patient may have prescription filled one day early if pharmacy is closed on scheduled refill date. Do not fill until: 11/20/16 To last until: 12/20/16  Order Specific Question:   Supervising Provider    Answer:   Delano Metz [phone]  . oxyCODONE (OXY IR/ROXICODONE) 5 MG immediate release tablet    Sig: Take 1 tablet (5 mg total) by mouth every 6 (six) hours as needed for severe pain.    Dispense:  120 tablet    Refill:  0    Do not place this medication, or any other prescription from our practice, on "Automatic Refill". Patient may have prescription filled one day early if pharmacy is closed on scheduled refill date. Do not  fill until: 12/20/16 To last until: 01/19/17    Order Specific Question:   Supervising Provider    Answer:   Delano Metz [phone]   New Prescriptions   No medications on file   Medications administered today: Ms. Barnwell had no medications administered during this visit. Lab-work, procedure(s), and/or referral(s): Orders Placed This Encounter  Procedures  . Ambulatory referral to Podiatry   Imaging and/or referral(s): AMB REFERRAL TO PODIATRY  Interventional therapies: Planned, scheduled, and/or pending:   Not at this time.   Considering:   Left L4-5 lumbar epiduralsteroid injection #2 Left L5-S1 transforaminalepidural steroid injection Right L4-5 transforaminalepidural steroid injection Left diagnostic cervical epiduralsteroid injection Diagnostic bilateral cervical facet block Possible bilateral cervical facet radiofrequencyablation Diagnostic bilateralintra-articular knee injection Diagnostic bilateral genicular nerve block Possible bilateral genicular nerve radiofrequencyablation Diagnostic bilateral lumbar facet block Possible bilateral lumbar facet radiofrequencyablation Diagnostic bilateral sacroiliac joint block Possible bilateral sacroiliac joint radiofrequencyablation Diagnostic bilateralintra-articular hip joint injection Possible bilateral hip radiofrequency ablation Diagnostic bilateralintra-articular shoulder joint injection Diagnostic bilateral suprascapular nerve block Possible bilateral suprascapular nerve radiofrequencyablation   Palliative PRN treatment(s):   Not at this time   Provider-requested follow-up: Return in about 3 months (around 12/30/2016) for Medication Mgmt.  No future appointments. Primary Care Physician: [facility] Location: [facility] Note by: Barbette Merino NP Date: 09/30/2016; Time: 12:09 PM  Pain Score Disclaimer: We use the NRS-11 scale. This is a  self-reported, subjective measurement of pain severity with only modest accuracy. It is used primarily to identify changes within a particular patient. It must be understood that outpatient pain scales are significantly less accurate that those used for research, where they can be applied under ideal controlled circumstances with minimal exposure to variables. In reality, the score is likely to be a combination of pain intensity and pain affect, where pain affect describes the degree of emotional arousal or changes in action readiness caused by the sensory experience of pain. Factors such as social and work situation, setting, emotional state, anxiety levels, expectation, and prior pain experience may influence pain perception and show large inter-individual differences that may also be affected by time variables.  Patient instructions provided during this appointment: Patient Instructions   Pain Score  Introduction: The pain score used by this practice is the Verbal Numerical Rating Scale (VNRS-11). This is an 11-point scale. It is for adults and children 10 years or older. There are significant differences in how the pain score is reported, used, and applied. Forget everything you learned in the past and learn this scoring system.  General Information: The scale should reflect your current level of pain. Unless you are specifically asked for the level of your worst pain, or your average pain. If you are asked for one of these two, then it should be understood that it is over the past 24 hours.  Basic Activities of Daily Living (ADL): Personal hygiene, dressing, eating,  transferring, and using restroom.  Instructions: Most patients tend to report their level of pain as a combination of two factors, their physical pain and their psychosocial pain. This last one is also known as "suffering" and it is reflection of how physical pain affects you socially and psychologically. From now on, report them  separately. From this point on, when asked to report your pain level, report only your physical pain. Use the following table for reference.  Pain Clinic Pain Levels (0-5/10)  Pain Level Score Description  No Pain 0   Mild pain 1 Nagging, annoying, but does not interfere with basic activities of daily living (ADL). Patients are able to eat, bathe, get dressed, toileting (being able to get on and off the toilet and perform personal hygiene functions), transfer (move in and out of bed or a chair without assistance), and maintain continence (able to control bladder and bowel functions). Blood pressure and heart rate are unaffected. A normal heart rate for a healthy adult ranges from 60 to 100 bpm (beats per minute).   Mild to moderate pain 2 Noticeable and distracting. Impossible to hide from other people. More frequent flare-ups. Still possible to adapt and function close to normal. It can be very annoying and may have occasional stronger flare-ups. With discipline, patients may get used to it and adapt.   Moderate pain 3 Interferes significantly with activities of daily living (ADL). It becomes difficult to feed, bathe, get dressed, get on and off the toilet or to perform personal hygiene functions. Difficult to get in and out of bed or a chair without assistance. Very distracting. With effort, it can be ignored when deeply involved in activities.   Moderately severe pain 4 Impossible to ignore for more than a few minutes. With effort, patients may still be able to manage work or participate in some social activities. Very difficult to concentrate. Signs of autonomic nervous system discharge are evident: dilated pupils (mydriasis); mild sweating (diaphoresis); sleep interference. Heart rate becomes elevated (>115 bpm). Diastolic blood pressure (lower number) rises above 100 mmHg. Patients find relief in laying down and not moving.   Severe pain 5 Intense and extremely unpleasant. Associated with  frowning face and frequent crying. Pain overwhelms the senses.  Ability to do any activity or maintain social relationships becomes significantly limited. Conversation becomes difficult. Pacing back and forth is common, as getting into a comfortable position is nearly impossible. Pain wakes you up from deep sleep. Physical signs will be obvious: pupillary dilation; increased sweating; goosebumps; brisk reflexes; cold, clammy hands and feet; nausea, vomiting or dry heaves; loss of appetite; significant sleep disturbance with inability to fall asleep or to remain asleep. When persistent, significant weight loss is observed due to the complete loss of appetite and sleep deprivation.  Blood pressure and heart rate becomes significantly elevated. Caution: If elevated blood pressure triggers a pounding headache associated with blurred vision, then the patient should immediately seek attention at an urgent or emergency care unit, as these may be signs of an impending stroke.    Emergency Department Pain Levels (6-10/10)  Emergency Room Pain 6 Severely limiting. Requires emergency care and should not be seen or managed at an outpatient pain management facility. Communication becomes difficult and requires great effort. Assistance to reach the emergency department may be required. Facial flushing and profuse sweating along with potentially dangerous increases in heart rate and blood pressure will be evident.   Distressing pain 7 Self-care is very difficult. Assistance is required to  transport, or use restroom. Assistance to reach the emergency department will be required. Tasks requiring coordination, such as bathing and getting dressed become very difficult.   Disabling pain 8 Self-care is no longer possible. At this level, pain is disabling. The individual is unable to do even the most "basic" activities such as walking, eating, bathing, dressing, transferring to a bed, or toileting. Fine motor skills are lost. It  is difficult to think clearly.   Incapacitating pain 9 Pain becomes incapacitating. Thought processing is no longer possible. Difficult to remember your own name. Control of movement and coordination are lost.   The worst pain imaginable 10 At this level, most patients pass out from pain. When this level is reached, collapse of the autonomic nervous system occurs, leading to a sudden drop in blood pressure and heart rate. This in turn results in a temporary and dramatic drop in blood flow to the brain, leading to a loss of consciousness. Fainting is one of the body's self defense mechanisms. Passing out puts the brain in a calmed state and causes it to shut down for a while, in order to begin the healing process.    Summary: 1. Refer to this scale when providing [place] with your pain level. 2. Be accurate and careful when reporting your pain level. This will help with your care. 3. Over-reporting your pain level will lead to loss of credibility. 4. Even a level of 1/10 means that there is pain and will be treated at our facility. 5. High, inaccurate reporting will be documented as "Symptom Exaggeration", leading to loss of credibility and suspicions of possible secondary gains such as obtaining more narcotics, or wanting to appear disabled, for fraudulent reasons. 6. Only pain levels of 5 or below will be seen at our facility. 7. Pain levels of 6 and above will be sent to the Emergency Department and the appointment cancelled. _____________________________________________________________________________________________

## 2016-09-30 NOTE — Progress Notes (Signed)
Nursing Pain Medication Assessment:  Safety precautions to be maintained throughout the outpatient stay will include: orient to surroundings, keep bed in low position, maintain call bell within reach at all times, provide assistance with transfer out of bed and ambulation.  Medication Inspection Compliance: Pill count conducted under aseptic conditions, in front of the patient. Neither the pills nor the bottle was removed from the patient's sight at any time. Once count was completed pills were immediately returned to the patient in their original bottle.  Medication: See above Pill/Patch Count: 0 of 120 pills remain Pill/Patch Appearance: Markings consistent with prescribed medication Bottle Appearance: Standard pharmacy container. Clearly labeled. Filled Date: 03 / 15 / 2018 Last Medication intake:  Ran out of medicine more than 48 hours ago

## 2016-09-30 NOTE — Progress Notes (Signed)
Patient is currently doing the bariatric diet and is planning to have surgery in the near future for weight loss. Patient has lost 36 lbs since November 2017

## 2016-10-16 ENCOUNTER — Ambulatory Visit (INDEPENDENT_AMBULATORY_CARE_PROVIDER_SITE_OTHER): Payer: Medicare Other | Admitting: Podiatry

## 2016-10-16 ENCOUNTER — Ambulatory Visit (INDEPENDENT_AMBULATORY_CARE_PROVIDER_SITE_OTHER): Payer: Medicare Other

## 2016-10-16 VITALS — BP 122/84 | HR 72 | Temp 97.7°F | Resp 16

## 2016-10-16 DIAGNOSIS — M722 Plantar fascial fibromatosis: Secondary | ICD-10-CM

## 2016-10-16 DIAGNOSIS — M7661 Achilles tendinitis, right leg: Secondary | ICD-10-CM | POA: Diagnosis not present

## 2016-10-16 DIAGNOSIS — M7750 Other enthesopathy of unspecified foot: Secondary | ICD-10-CM

## 2016-10-16 DIAGNOSIS — M7662 Achilles tendinitis, left leg: Secondary | ICD-10-CM

## 2016-10-16 MED ORDER — MELOXICAM 15 MG PO TABS: 15.0000 mg | ORAL_TABLET | Freq: Every day | ORAL | 3 refills | Status: DC

## 2016-10-16 MED ORDER — METHYLPREDNISOLONE 4 MG PO TBPK: ORAL_TABLET | ORAL | 0 refills | Status: DC

## 2016-10-16 NOTE — Patient Instructions (Signed)

## 2016-10-16 NOTE — Progress Notes (Signed)
She presents today as a new patient with a chief complaint of pain to the bilateral heels Achilles area times a past 7 months. Doesn't recall when they started hurting but notices that her plantar heel started hurting prior to the Achilles pain and seemed to have resolved. She states this worse when she is getting out of bed in the morning particularly along the Achilles area and some tenderness in the arch. She currently wears open back shoes because tennis shoes bother her heels. She's tried pain medication from her pain management doctor no relief.  Objective: I have reviewed her extensive past medical history medications allergies social history and surgeries.  Vital signs are stable she's in no acute distress. She is alert and oriented 3. Pulses are strongly palpable. Neurologic sensorium is in tact deep tendon reflexes are intact muscle strength +5 over 5 dorsiflexion and plantar flexors and inverters everters all intrinsic musculature is intact. She has a nonpulsatile mass to the posterior aspect of the right heel which does appear to have fluctuance within it. Similarly so on the left heel but to a less degree. She has moderate to severe pain on palpation of the medial calcaneal tubercles bilateral. Radiographs reviewed that were taken in February 2018 [place] primary care provider does demonstrate posterior calcaneal heel spur right greater than the left and a soft tissue increase in density at the plantar fascia calcaneal insertion site. These views were not weightbearing surface difficult make certain assumptions. No open lesions are noted. No edema no cellulitis drainage or odor no ecchymosis area mild reactive hyperkeratosis bilateral.  Assessment: Plantar fasciitis bilateral more than likely leading to Achilles tendinitis and bursitis of the right heel over the left.  Plan: Start her on a Medrol Dosepak to be followed by meloxicam. I also injected her bursa today with 2 mg of dexamethasone  did not inject into the tendons. This was all subcutaneous in the area of fluctuance. This was performed after Betadine prep was utilized. I also injected the plantar fascia area with Kenalog and local anesthetic total of 20 mg was utilized. Placed her plantar fascial braces and a night splint. Discussed appropriate shoe gear stretching exercises ice therapy and shoe gear modifications. All up with her in 1 month.

## 2016-10-17 ENCOUNTER — Other Ambulatory Visit: Payer: Self-pay | Admitting: Family Medicine

## 2016-10-17 ENCOUNTER — Ambulatory Visit
Admission: RE | Admit: 2016-10-17 | Discharge: 2016-10-17 | Disposition: A | Discharge status: Home / Self Care | Payer: Medicare Other | Source: Ambulatory Visit | Attending: Family Medicine | Admitting: Family Medicine

## 2016-10-17 DIAGNOSIS — Z01818 Encounter for other preprocedural examination: Secondary | ICD-10-CM | POA: Diagnosis not present

## 2016-10-31 ENCOUNTER — Other Ambulatory Visit: Payer: Self-pay | Admitting: Bariatrics

## 2016-11-07 ENCOUNTER — Ambulatory Visit
Admission: RE | Admit: 2016-11-07 | Discharge: 2016-11-07 | Disposition: A | Discharge status: Home / Self Care | Payer: Medicare Other | Source: Ambulatory Visit | Attending: Bariatrics | Admitting: Bariatrics

## 2016-11-07 DIAGNOSIS — K824 Cholesterolosis of gallbladder: Secondary | ICD-10-CM | POA: Diagnosis not present

## 2016-11-13 ENCOUNTER — Encounter: Payer: Self-pay | Admitting: Podiatry

## 2016-11-13 ENCOUNTER — Ambulatory Visit (INDEPENDENT_AMBULATORY_CARE_PROVIDER_SITE_OTHER): Payer: Medicare Other | Admitting: Podiatry

## 2016-11-13 DIAGNOSIS — M7662 Achilles tendinitis, left leg: Secondary | ICD-10-CM

## 2016-11-13 DIAGNOSIS — M722 Plantar fascial fibromatosis: Secondary | ICD-10-CM | POA: Diagnosis not present

## 2016-11-13 DIAGNOSIS — M7661 Achilles tendinitis, right leg: Secondary | ICD-10-CM | POA: Diagnosis not present

## 2016-11-13 NOTE — Progress Notes (Signed)
She presents today for follow-up of her fasciitis and Achilles tendinitis is still little sore right ear she points to the posterior aspect of the heel.  Objective: Vital signs are stable alert and oriented 3. Pulses are palpable. She has tenderness on palpation of the Achilles tendon bilaterally.  Assessment: Insertional Achilles tendinitis.  Plan: Inject the area today subcutaneously not injecting into the Achilles tendon. 2 mg of dexamethasone was utilized with local anesthetic. Follow up with her in 1 month if necessary.

## 2016-11-19 ENCOUNTER — Ambulatory Visit: Payer: Medicare Other | Attending: Internal Medicine

## 2016-11-19 DIAGNOSIS — R0683 Snoring: Secondary | ICD-10-CM | POA: Insufficient documentation

## 2016-11-19 DIAGNOSIS — G4733 Obstructive sleep apnea (adult) (pediatric): Secondary | ICD-10-CM | POA: Insufficient documentation

## 2016-11-19 DIAGNOSIS — R51 Headache: Secondary | ICD-10-CM | POA: Insufficient documentation

## 2016-11-19 DIAGNOSIS — Z79899 Other long term (current) drug therapy: Secondary | ICD-10-CM | POA: Insufficient documentation

## 2016-12-30 ENCOUNTER — Ambulatory Visit: Payer: Medicare Other | Attending: Nurse Practitioner | Admitting: Nurse Practitioner
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
## 2016-12-31 ENCOUNTER — Encounter: Payer: Self-pay | Admitting: Nurse Practitioner

## 2016-12-31 ENCOUNTER — Ambulatory Visit: Payer: Medicare Other | Attending: Nurse Practitioner | Admitting: Nurse Practitioner

## 2016-12-31 VITALS — BP 127/84 | HR 77 | Temp 98.2°F | Resp 18 | Ht 67.0 in | Wt 263.0 lb

## 2016-12-31 DIAGNOSIS — Z8249 Family history of ischemic heart disease and other diseases of the circulatory system: Secondary | ICD-10-CM | POA: Insufficient documentation

## 2016-12-31 DIAGNOSIS — R252 Cramp and spasm: Secondary | ICD-10-CM

## 2016-12-31 DIAGNOSIS — Z6841 Body Mass Index (BMI) 40.0 and over, adult: Secondary | ICD-10-CM | POA: Insufficient documentation

## 2016-12-31 DIAGNOSIS — E7800 Pure hypercholesterolemia, unspecified: Secondary | ICD-10-CM | POA: Insufficient documentation

## 2016-12-31 DIAGNOSIS — J45909 Unspecified asthma, uncomplicated: Secondary | ICD-10-CM | POA: Insufficient documentation

## 2016-12-31 DIAGNOSIS — F329 Major depressive disorder, single episode, unspecified: Secondary | ICD-10-CM | POA: Diagnosis not present

## 2016-12-31 DIAGNOSIS — M792 Neuralgia and neuritis, unspecified: Secondary | ICD-10-CM | POA: Diagnosis not present

## 2016-12-31 DIAGNOSIS — Z833 Family history of diabetes mellitus: Secondary | ICD-10-CM | POA: Insufficient documentation

## 2016-12-31 DIAGNOSIS — R600 Localized edema: Secondary | ICD-10-CM | POA: Diagnosis not present

## 2016-12-31 DIAGNOSIS — Z79891 Long term (current) use of opiate analgesic: Secondary | ICD-10-CM | POA: Diagnosis not present

## 2016-12-31 DIAGNOSIS — M25511 Pain in right shoulder: Secondary | ICD-10-CM | POA: Diagnosis not present

## 2016-12-31 DIAGNOSIS — M5416 Radiculopathy, lumbar region: Secondary | ICD-10-CM

## 2016-12-31 DIAGNOSIS — M4696 Unspecified inflammatory spondylopathy, lumbar region: Secondary | ICD-10-CM | POA: Diagnosis not present

## 2016-12-31 DIAGNOSIS — M25512 Pain in left shoulder: Secondary | ICD-10-CM | POA: Diagnosis not present

## 2016-12-31 DIAGNOSIS — K219 Gastro-esophageal reflux disease without esophagitis: Secondary | ICD-10-CM | POA: Insufficient documentation

## 2016-12-31 DIAGNOSIS — M1612 Unilateral primary osteoarthritis, left hip: Secondary | ICD-10-CM | POA: Diagnosis not present

## 2016-12-31 DIAGNOSIS — M791 Myalgia: Secondary | ICD-10-CM

## 2016-12-31 DIAGNOSIS — M79641 Pain in right hand: Secondary | ICD-10-CM | POA: Insufficient documentation

## 2016-12-31 DIAGNOSIS — M47816 Spondylosis without myelopathy or radiculopathy, lumbar region: Secondary | ICD-10-CM

## 2016-12-31 DIAGNOSIS — F411 Generalized anxiety disorder: Secondary | ICD-10-CM | POA: Diagnosis not present

## 2016-12-31 DIAGNOSIS — M542 Cervicalgia: Secondary | ICD-10-CM | POA: Diagnosis not present

## 2016-12-31 DIAGNOSIS — Z79899 Other long term (current) drug therapy: Secondary | ICD-10-CM | POA: Insufficient documentation

## 2016-12-31 DIAGNOSIS — Z87820 Personal history of traumatic brain injury: Secondary | ICD-10-CM | POA: Insufficient documentation

## 2016-12-31 DIAGNOSIS — K449 Diaphragmatic hernia without obstruction or gangrene: Secondary | ICD-10-CM | POA: Diagnosis not present

## 2016-12-31 DIAGNOSIS — Z5181 Encounter for therapeutic drug level monitoring: Secondary | ICD-10-CM | POA: Insufficient documentation

## 2016-12-31 DIAGNOSIS — M7918 Myalgia, other site: Secondary | ICD-10-CM

## 2016-12-31 DIAGNOSIS — M797 Fibromyalgia: Secondary | ICD-10-CM | POA: Diagnosis not present

## 2016-12-31 DIAGNOSIS — G8929 Other chronic pain: Secondary | ICD-10-CM

## 2016-12-31 DIAGNOSIS — G894 Chronic pain syndrome: Secondary | ICD-10-CM

## 2016-12-31 DIAGNOSIS — G4733 Obstructive sleep apnea (adult) (pediatric): Secondary | ICD-10-CM | POA: Diagnosis not present

## 2016-12-31 DIAGNOSIS — M549 Dorsalgia, unspecified: Secondary | ICD-10-CM | POA: Insufficient documentation

## 2016-12-31 DIAGNOSIS — R079 Chest pain, unspecified: Secondary | ICD-10-CM | POA: Insufficient documentation

## 2016-12-31 DIAGNOSIS — Z87891 Personal history of nicotine dependence: Secondary | ICD-10-CM | POA: Insufficient documentation

## 2016-12-31 DIAGNOSIS — I10 Essential (primary) hypertension: Secondary | ICD-10-CM | POA: Diagnosis not present

## 2016-12-31 DIAGNOSIS — Z882 Allergy status to sulfonamides status: Secondary | ICD-10-CM | POA: Insufficient documentation

## 2016-12-31 DIAGNOSIS — R32 Unspecified urinary incontinence: Secondary | ICD-10-CM | POA: Diagnosis not present

## 2016-12-31 DIAGNOSIS — Z803 Family history of malignant neoplasm of breast: Secondary | ICD-10-CM | POA: Insufficient documentation

## 2016-12-31 DIAGNOSIS — Z888 Allergy status to other drugs, medicaments and biological substances status: Secondary | ICD-10-CM | POA: Insufficient documentation

## 2016-12-31 MED ORDER — BACLOFEN 10 MG PO TABS: 10.0000 mg | ORAL_TABLET | Freq: Every evening | ORAL | 2 refills | Status: DC | PRN

## 2016-12-31 MED ORDER — OXYCODONE HCL 5 MG PO TABS: 5.0000 mg | ORAL_TABLET | Freq: Four times a day (QID) | ORAL | 0 refills | Status: DC | PRN

## 2016-12-31 MED ORDER — CYCLOBENZAPRINE HCL 10 MG PO TABS: 10.0000 mg | ORAL_TABLET | Freq: Three times a day (TID) | ORAL | 2 refills | Status: DC | PRN

## 2016-12-31 MED ORDER — GABAPENTIN 300 MG PO CAPS
300.0000 mg | ORAL_CAPSULE | Freq: Three times a day (TID) | ORAL | 2 refills | Status: DC
Start: 2017-01-19 — End: 2017-03-26

## 2016-12-31 NOTE — Progress Notes (Signed)
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 transforaminalepidural steroid injection Right L4-5 transforaminalepidural steroid injection Left diagnostic cervical epiduralsteroid injection Diagnostic bilateral cervical facet block Possible bilateral cervical facet radiofrequencyablation Diagnostic bilateralintra-articular knee injection Diagnostic bilateral genicular nerve block Possible bilateral genicular nerve radiofrequencyablation Diagnostic bilateral lumbar facet block Possible bilateral lumbar facet radiofrequencyablation Diagnostic bilateral sacroiliac joint block Possible bilateral sacroiliac joint radiofrequencyablation Diagnostic bilateralintra-articular hip joint injection Possible bilateral hip radiofrequency ablation Diagnostic bilateralintra-articular shoulder joint injection Diagnostic bilateral suprascapular nerve block Possible bilateral suprascapular nerve radiofrequencyablation   Palliative PRN treatment(s):   Not at this time   Provider-requested follow-up: Return in about 3 months (around 04/02/2017)  for MedMgmt.  Future Appointments Date Time Provider [place]  03/25/2017 10:30 AM Vevelyn Francois, NP [facility] None   Primary Care Physician: [place] [place] Location: [facility] Note by: Vevelyn Francois NP Date: 12/31/2016; Time: 11:31 AM  Pain Score Disclaimer: We use the NRS-11 scale. This is a self-reported, subjective measurement of pain severity with only modest accuracy. It is used primarily to identify changes within a particular patient. It must be understood that outpatient pain scales are significantly less accurate that those used for research, where they can be applied under ideal controlled circumstances with minimal exposure to variables. In reality, the score is likely to be a combination of pain intensity and pain affect, where pain affect describes the degree of emotional arousal or changes in action readiness caused by the sensory experience of pain. Factors such as social and work situation, setting, emotional state, anxiety levels, expectation, and prior pain experience may influence pain perception and show large inter-individual differences that may also be affected by time variables.  Patient instructions provided during this appointment: Patient Instructions   ____________________________________________________________________________________________  Medication Rules  Applies to: All patients receiving prescriptions (written or electronic).  Pharmacy of record: Pharmacy where electronic prescriptions will be sent. If written prescriptions are taken to a different pharmacy, please inform the nursing staff. The pharmacy listed in the electronic medical record should be the one where you would like electronic prescriptions to be sent.  Prescription refills: Only during scheduled appointments. Applies to both, written and electronic prescriptions.  NOTE: The following applies primarily to controlled substances  (Opioid* Pain Medications).   Patient's responsibilities: 1. Pain Pills: Bring all pain pills to every appointment (except for procedure appointments). 2. Pill Bottles: Bring pills in original pharmacy bottle. Always bring newest bottle. Bring bottle, even if empty. 3. Medication refills: You are responsible for knowing and keeping track of what medications you need refilled. The day before your appointment, write a list of all prescriptions that need to be refilled. Bring that list to your appointment and give it to the admitting nurse. Prescriptions will be written only during appointments. If you forget a medication, it will not be "Called in", "Faxed", or "electronically sent". You will need to get another appointment to get these prescribed. 4. Prescription Accuracy: You are responsible for carefully inspecting your prescriptions before leaving our office. Have the discharge nurse carefully go over each prescription with  you, before taking them home. Make sure that your name is accurately spelled, that your address is correct. Check the name and dose of your medication to make sure it is accurate. Check the number of pills, and the written instructions to make sure they are clear and accurate. Make sure that you are given enough medication to last until your next medication refill appointment. 5. Taking Medication: Take medication as prescribed. Never take more pills than instructed. Never take medication more frequently than prescribed. Taking less pills or less frequently is permitted and encouraged, when it comes to controlled substances (written prescriptions).  6. Inform other Doctors: Always inform, all of your healthcare providers, of all the medications you take. 7. Pain Medication from other Providers: You are not allowed to accept any additional pain medication from any other Doctor or Healthcare provider. There are two exceptions to this rule. (see below) In the event that you require  additional pain medication, you are responsible for notifying us, as stated below. 8. Medication Agreement: You are responsible for carefully reading and following our Medication Agreement. This must be signed before receiving any prescriptions from our practice. Safely store a copy of your signed Agreement. Violations to the Agreement will result in no further prescriptions. (Additional copies of our Medication Agreement are available upon request.) 9. Laws, Rules, & Regulations: All patients are expected to follow all Federal and [employer], Rules, [employer]. Ignorance of the Laws does not constitute a valid excuse. The use of any illegal substances is prohibited. 10. Adopted CDC guidelines & recommendations: Target dosing levels will be at or below 60 MME/day. Use of benzodiazepines** is not recommended.  Exceptions: There are only two exceptions to the rule of not receiving pain medications from other Healthcare Providers. 1. Exception #1 (Emergencies): In the event of an emergency (i.e.: accident requiring emergency care), you are allowed to receive additional pain medication. However, you are responsible for: As soon as you are able, call our office [phone], at any time of the day or night, and leave a message stating your name, the date and nature of the emergency, and the name and dose of the medication prescribed. In the event that your call is answered by a member of our staff, make sure to document and save the date, time, and the name of the person that took your information.  2. Exception #2 (Planned Surgery): In the event that you are scheduled by another doctor or dentist to have any type of surgery or procedure, you are allowed (for a period no longer than 30 days), to receive additional pain medication, for the acute post-op pain. However, in this case, you are responsible for picking up a copy of our "Post-op Pain Management for Surgeons" handout, and giving it to your  surgeon or dentist. This document is available at our office, and does not require an appointment to obtain it. Simply go to our office during business hours (Monday-Thursday from 8:00 AM to 4:00 PM) (Friday 8:00 AM to 12:00 Noon) or if you have a scheduled appointment with [place], prior to your surgery, and ask for it by name. In addition, you will need to provide [place] with your name, name of your surgeon, type of surgery, and date of procedure or surgery.  *Opioid medications include: morphine, codeine, oxycodone, oxymorphone, hydrocodone, hydromorphone, meperidine, tramadol, tapentadol, buprenorphine, fentanyl, methadone. **Benzodiazepine medications include: diazepam (Valium), alprazolam (Xanax), clonazepam (Klonopine), lorazepam (Ativan), clorazepate (Tranxene), chlordiazepoxide (Librium), estazolam (Prosom), oxazepam (Serax), temazepam (Restoril),  triazolam (Halcion)  ____________________________________________________________________________________________

## 2016-12-31 NOTE — Progress Notes (Addendum)
Nursing Pain Medication Assessment:  Safety precautions to be maintained throughout the outpatient stay will include: orient to surroundings, keep bed in low position, maintain call bell within reach at all times, provide assistance with transfer out of bed and ambulation.  Medication Inspection Compliance: Pill count conducted under aseptic conditions, in front of the patient. Neither the pills nor the bottle was removed from the patient's sight at any time. Once count was completed pills were immediately returned to the patient in their original bottle.  Medication: Oxycodone IR Pill/Patch Count: 85 of 120 pills remain Pill/Patch Appearance: Markings consistent with prescribed medication Bottle Appearance: Standard pharmacy container. Clearly labeled. Filled Date 07 / 16 / 2018 Last Medication intake:  Today  Patient states she is scheduled for a D and C on 01-08-17.  Post surgical pain management information sheet given.

## 2016-12-31 NOTE — Patient Instructions (Signed)

## 2017-02-20 ENCOUNTER — Telehealth: Payer: Self-pay

## 2017-02-20 MED ORDER — MELOXICAM 15 MG PO TABS: 15.0000 mg | ORAL_TABLET | Freq: Every day | ORAL | 3 refills | Status: DC

## 2017-02-20 NOTE — Telephone Encounter (Signed)
Refilled Meloxicam per Dr. Milinda Pointer

## 2017-03-25 ENCOUNTER — Encounter: Payer: Medicare Other | Admitting: Nurse Practitioner

## 2017-03-26 ENCOUNTER — Encounter: Payer: Self-pay | Admitting: Nurse Practitioner

## 2017-03-26 ENCOUNTER — Ambulatory Visit: Payer: Medicare Other | Attending: Nurse Practitioner | Admitting: Nurse Practitioner

## 2017-03-26 VITALS — BP 140/77 | Temp 97.8°F | Resp 16 | Ht 67.0 in | Wt 269.0 lb

## 2017-03-26 DIAGNOSIS — M79604 Pain in right leg: Secondary | ICD-10-CM | POA: Diagnosis not present

## 2017-03-26 DIAGNOSIS — R252 Cramp and spasm: Secondary | ICD-10-CM

## 2017-03-26 DIAGNOSIS — Z6841 Body Mass Index (BMI) 40.0 and over, adult: Secondary | ICD-10-CM | POA: Diagnosis not present

## 2017-03-26 DIAGNOSIS — M1712 Unilateral primary osteoarthritis, left knee: Secondary | ICD-10-CM | POA: Insufficient documentation

## 2017-03-26 DIAGNOSIS — Z87891 Personal history of nicotine dependence: Secondary | ICD-10-CM | POA: Diagnosis not present

## 2017-03-26 DIAGNOSIS — R32 Unspecified urinary incontinence: Secondary | ICD-10-CM | POA: Insufficient documentation

## 2017-03-26 DIAGNOSIS — M1612 Unilateral primary osteoarthritis, left hip: Secondary | ICD-10-CM | POA: Insufficient documentation

## 2017-03-26 DIAGNOSIS — M25552 Pain in left hip: Secondary | ICD-10-CM | POA: Insufficient documentation

## 2017-03-26 DIAGNOSIS — M25551 Pain in right hip: Secondary | ICD-10-CM | POA: Diagnosis not present

## 2017-03-26 DIAGNOSIS — M25512 Pain in left shoulder: Secondary | ICD-10-CM | POA: Diagnosis not present

## 2017-03-26 DIAGNOSIS — M25511 Pain in right shoulder: Secondary | ICD-10-CM | POA: Diagnosis not present

## 2017-03-26 DIAGNOSIS — R079 Chest pain, unspecified: Secondary | ICD-10-CM | POA: Insufficient documentation

## 2017-03-26 DIAGNOSIS — M797 Fibromyalgia: Secondary | ICD-10-CM | POA: Insufficient documentation

## 2017-03-26 DIAGNOSIS — G894 Chronic pain syndrome: Secondary | ICD-10-CM

## 2017-03-26 DIAGNOSIS — J45909 Unspecified asthma, uncomplicated: Secondary | ICD-10-CM | POA: Diagnosis not present

## 2017-03-26 DIAGNOSIS — G8929 Other chronic pain: Secondary | ICD-10-CM | POA: Diagnosis not present

## 2017-03-26 DIAGNOSIS — G4733 Obstructive sleep apnea (adult) (pediatric): Secondary | ICD-10-CM | POA: Insufficient documentation

## 2017-03-26 DIAGNOSIS — K219 Gastro-esophageal reflux disease without esophagitis: Secondary | ICD-10-CM | POA: Diagnosis not present

## 2017-03-26 DIAGNOSIS — K449 Diaphragmatic hernia without obstruction or gangrene: Secondary | ICD-10-CM | POA: Insufficient documentation

## 2017-03-26 DIAGNOSIS — I10 Essential (primary) hypertension: Secondary | ICD-10-CM | POA: Insufficient documentation

## 2017-03-26 DIAGNOSIS — Z5181 Encounter for therapeutic drug level monitoring: Secondary | ICD-10-CM | POA: Diagnosis not present

## 2017-03-26 DIAGNOSIS — M7918 Myalgia, other site: Secondary | ICD-10-CM | POA: Diagnosis not present

## 2017-03-26 DIAGNOSIS — M5416 Radiculopathy, lumbar region: Secondary | ICD-10-CM | POA: Insufficient documentation

## 2017-03-26 DIAGNOSIS — M79641 Pain in right hand: Secondary | ICD-10-CM | POA: Diagnosis not present

## 2017-03-26 DIAGNOSIS — M545 Low back pain: Secondary | ICD-10-CM | POA: Insufficient documentation

## 2017-03-26 DIAGNOSIS — M792 Neuralgia and neuritis, unspecified: Secondary | ICD-10-CM

## 2017-03-26 DIAGNOSIS — Z79899 Other long term (current) drug therapy: Secondary | ICD-10-CM | POA: Insufficient documentation

## 2017-03-26 DIAGNOSIS — Z79891 Long term (current) use of opiate analgesic: Secondary | ICD-10-CM | POA: Diagnosis not present

## 2017-03-26 DIAGNOSIS — F329 Major depressive disorder, single episode, unspecified: Secondary | ICD-10-CM | POA: Diagnosis not present

## 2017-03-26 DIAGNOSIS — M79605 Pain in left leg: Secondary | ICD-10-CM | POA: Insufficient documentation

## 2017-03-26 DIAGNOSIS — E7800 Pure hypercholesterolemia, unspecified: Secondary | ICD-10-CM | POA: Insufficient documentation

## 2017-03-26 DIAGNOSIS — F419 Anxiety disorder, unspecified: Secondary | ICD-10-CM | POA: Insufficient documentation

## 2017-03-26 DIAGNOSIS — G4700 Insomnia, unspecified: Secondary | ICD-10-CM | POA: Insufficient documentation

## 2017-03-26 MED ORDER — OXYCODONE HCL 5 MG PO TABS: 5.0000 mg | ORAL_TABLET | Freq: Four times a day (QID) | ORAL | 0 refills | Status: DC | PRN

## 2017-03-26 MED ORDER — BACLOFEN 10 MG PO TABS: 10.0000 mg | ORAL_TABLET | Freq: Every evening | ORAL | 2 refills | Status: DC | PRN

## 2017-03-26 MED ORDER — GABAPENTIN 300 MG PO CAPS: 300.0000 mg | ORAL_CAPSULE | Freq: Three times a day (TID) | ORAL | 2 refills | Status: DC

## 2017-03-26 MED ORDER — CYCLOBENZAPRINE HCL 10 MG PO TABS: 10.0000 mg | ORAL_TABLET | Freq: Three times a day (TID) | ORAL | 2 refills | Status: DC | PRN

## 2017-03-26 NOTE — Progress Notes (Signed)
Nursing Pain Medication Assessment:  Safety precautions to be maintained throughout the outpatient stay will include: orient to surroundings, keep bed in low position, maintain call bell within reach at all times, provide assistance with transfer out of bed and ambulation.  Medication Inspection Compliance: Pill count conducted under aseptic conditions, in front of the patient. Neither the pills nor the bottle was removed from the patient's sight at any time. Once count was completed pills were immediately returned to the patient in their original bottle.  Medication: See above Pill/Patch Count: 0 of 120 pills remain Pill/Patch Appearance: Markings consistent with prescribed medication Bottle Appearance: Standard pharmacy container. Clearly labeled. Filled Date: 54 / 17 / 2018 Last Medication intake:  Today

## 2017-03-26 NOTE — Patient Instructions (Addendum)
You have been given 3 prescriptions for oxycodone today.  You had a UDS done and you have prescriptions to be picked up in the pharmacy as well.  ____________________________________________________________________________________________  Medication Rules  Applies to: All patients receiving prescriptions (written or electronic).  Pharmacy of record: Pharmacy where electronic prescriptions will be sent. If written prescriptions are taken to a different pharmacy, please inform the nursing staff. The pharmacy listed in the electronic medical record should be the one where you would like electronic prescriptions to be sent.  Prescription refills: Only during scheduled appointments. Applies to both, written and electronic prescriptions.  NOTE: The following applies primarily to controlled substances (Opioid* Pain Medications).   Patient's responsibilities: 1. Pain Pills: Bring all pain pills to every appointment (except for procedure appointments). 2. Pill Bottles: Bring pills in original pharmacy bottle. Always bring newest bottle. Bring bottle, even if empty. 3. Medication refills: You are responsible for knowing and keeping track of what medications you need refilled. The day before your appointment, write a list of all prescriptions that need to be refilled. Bring that list to your appointment and give it to the admitting nurse. Prescriptions will be written only during appointments. If you forget a medication, it will not be "Called in", "Faxed", or "electronically sent". You will need to get another appointment to get these prescribed. 4. Prescription Accuracy: You are responsible for carefully inspecting your prescriptions before leaving our office. Have the discharge nurse carefully go over each prescription with you, before taking them home. Make sure that your name is accurately spelled, that your address is correct. Check the name and dose of your medication to make sure it is accurate. Check  the number of pills, and the written instructions to make sure they are clear and accurate. Make sure that you are given enough medication to last until your next medication refill appointment. 5. Taking Medication: Take medication as prescribed. Never take more pills than instructed. Never take medication more frequently than prescribed. Taking less pills or less frequently is permitted and encouraged, when it comes to controlled substances (written prescriptions).  6. Inform other Doctors: Always inform, all of your healthcare providers, of all the medications you take. 7. Pain Medication from other Providers: You are not allowed to accept any additional pain medication from any other Doctor or Healthcare provider. There are two exceptions to this rule. (see below) In the event that you require additional pain medication, you are responsible for notifying us, as stated below. 8. Medication Agreement: You are responsible for carefully reading and following our Medication Agreement. This must be signed before receiving any prescriptions from our practice. Safely store a copy of your signed Agreement. Violations to the Agreement will result in no further prescriptions. (Additional copies of our Medication Agreement are available upon request.) 9. Laws, Rules, & Regulations: All patients are expected to follow all Federal and [employer], [employer], Rules, [employer]. Ignorance of the Laws does not constitute a valid excuse. The use of any illegal substances is prohibited. 10. Adopted CDC guidelines & recommendations: Target dosing levels will be at or below 60 MME/day. Use of benzodiazepines** is not recommended.  Exceptions: There are only two exceptions to the rule of not receiving pain medications from other Healthcare Providers. 1. Exception #1 (Emergencies): In the event of an emergency (i.e.: accident requiring emergency care), you are allowed to receive additional pain medication. However, you are  responsible for: As soon as you are able, call our office [phone], at  any time of the day or night, and leave a message stating your name, the date and nature of the emergency, and the name and dose of the medication prescribed. In the event that your call is answered by a member of our staff, make sure to document and save the date, time, and the name of the person that took your information.  2. Exception #2 (Planned Surgery): In the event that you are scheduled by another doctor or dentist to have any type of surgery or procedure, you are allowed (for a period no longer than 30 days), to receive additional pain medication, for the acute post-op pain. However, in this case, you are responsible for picking up a copy of our "Post-op Pain Management for Surgeons" handout, and giving it to your surgeon or dentist. This document is available at our office, and does not require an appointment to obtain it. Simply go to our office during business hours (Monday-Thursday from 8:00 AM to 4:00 PM) (Friday 8:00 AM to 12:00 Noon) or if you have a scheduled appointment with [place], prior to your surgery, and ask for it by name. In addition, you will need to provide [place] with your name, name of your surgeon, type of surgery, and date of procedure or surgery.  *Opioid medications include: morphine, codeine, oxycodone, oxymorphone, hydrocodone, hydromorphone, meperidine, tramadol, tapentadol, buprenorphine, fentanyl, methadone. **Benzodiazepine medications include: diazepam (Valium), alprazolam (Xanax), clonazepam (Klonopine), lorazepam (Ativan), clorazepate (Tranxene), chlordiazepoxide (Librium), estazolam (Prosom), oxazepam (Serax), temazepam (Restoril), triazolam (Halcion)  ____________________________________________________________________________________________

## 2017-03-26 NOTE — Progress Notes (Signed)
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 have prescriptions to be picked up in the pharmacy as well.  ____________________________________________________________________________________________  Medication Rules  Applies to: All patients receiving prescriptions (written or electronic).  Pharmacy of record: Pharmacy where electronic prescriptions will be sent. If written prescriptions are taken to a different pharmacy, please inform the nursing staff. The pharmacy listed in the electronic medical record should be the one where you would like electronic prescriptions to be sent.  Prescription refills: Only during scheduled appointments. Applies to both, written and electronic prescriptions.  NOTE: The following applies primarily to controlled substances (Opioid* Pain Medications).   Patient's responsibilities: 1. Pain Pills: Bring all pain pills to every appointment (except for procedure appointments). 2. Pill Bottles:  Bring pills in original pharmacy bottle. Always bring newest bottle. Bring bottle, even if empty. 3. Medication refills: You are responsible for knowing and keeping track of what  medications you need refilled. The day before your appointment, write a list of all prescriptions that need to be refilled. Bring that list to your appointment and give it to the admitting nurse. Prescriptions will be written only during appointments. If you forget a medication, it will not be "Called in", "Faxed", or "electronically sent". You will need to get another appointment to get these prescribed. 4. Prescription Accuracy: You are responsible for carefully inspecting your prescriptions before leaving our office. Have the discharge nurse carefully go over each prescription with you, before taking them home. Make sure that your name is accurately spelled, that your address is correct. Check the name and dose of your medication to make sure it is accurate. Check the number of pills, and the written instructions to make sure they are clear and accurate. Make sure that you are given enough medication to last until your next medication refill appointment. 5. Taking Medication: Take medication as prescribed. Never take more pills than instructed. Never take medication more frequently than prescribed. Taking less pills or less frequently is permitted and encouraged, when it comes to controlled substances (written prescriptions).  6. Inform other Doctors: Always inform, all of your healthcare providers, of all the medications you take. 7. Pain Medication from other Providers: You are not allowed to accept any additional pain medication from any other Doctor or Healthcare provider. There are two exceptions to this rule. (see below) In the event that you require additional pain medication, you are responsible for notifying us, as stated below. 8. Medication Agreement: You are responsible for carefully reading and following our Medication  Agreement. This must be signed before receiving any prescriptions from our practice. Safely store a copy of your signed Agreement. Violations to the Agreement will result in no further prescriptions. (Additional copies of our Medication Agreement are available upon request.) 9. Laws, Rules, & Regulations: All patients are expected to follow all Federal and [employer], [employer], Rules, [employer]. Ignorance of the Laws does not constitute a valid excuse. The use of any illegal substances is prohibited. 10. Adopted CDC guidelines & recommendations: Target dosing levels will be at or below 60 MME/day. Use of benzodiazepines** is not recommended.  Exceptions: There are only two exceptions to the rule of not receiving pain medications from other Healthcare Providers. 1. Exception #1 (Emergencies): In the event of an emergency (i.e.: accident requiring emergency care), you are allowed to receive additional pain medication. However, you are responsible for: As soon as you are able, call our office [phone], at any time of the day or night, and leave a message stating your name, the date and nature of the emergency, and the name and dose of the medication prescribed. In the event that your call is answered by a member of our staff, make sure to document and save the date, time, and the name of the person that took your information.  2. Exception #2 (Planned Surgery): In the event that you are scheduled by another doctor or dentist to have any type of surgery or procedure, you are allowed (for a period no longer than 30 days), to receive additional pain medication, for the acute post-op pain. However, in this case, you are responsible for picking up a copy of our "Post-op Pain Management for Surgeons" handout, and giving it to your surgeon or dentist. This document is available at our office, and does not require an appointment to obtain it. Simply go to our office during business hours (Monday-Thursday from  8:00 AM to 4:00 PM) (Friday 8:00 AM to 12:00 Noon) or if you have a scheduled appointment with [place], prior to your surgery, and ask for it by name. In addition, you will need to provide [place] with your name, name of your surgeon, type of surgery, and date of procedure or surgery.  *Opioid medications include: morphine, codeine, oxycodone, oxymorphone, hydrocodone, hydromorphone, meperidine, tramadol, tapentadol, buprenorphine, fentanyl, methadone. **Benzodiazepine medications include: diazepam (Valium), alprazolam (Xanax), clonazepam (Klonopine), lorazepam (Ativan), clorazepate (Tranxene), chlordiazepoxide (Librium), estazolam (Prosom), oxazepam (Serax), temazepam (Restoril), triazolam (Halcion)  ____________________________________________________________________________________________

## 2017-04-02 LAB — TOXASSURE SELECT 13 (MW), URINE

## 2017-04-24 ENCOUNTER — Ambulatory Visit (INDEPENDENT_AMBULATORY_CARE_PROVIDER_SITE_OTHER): Payer: Medicare Other | Admitting: Gastroenterology

## 2017-04-24 ENCOUNTER — Other Ambulatory Visit: Payer: Self-pay

## 2017-04-24 ENCOUNTER — Encounter: Payer: Self-pay | Admitting: Gastroenterology

## 2017-04-24 VITALS — BP 109/79 | HR 81 | Temp 97.7°F | Wt 269.6 lb

## 2017-04-24 DIAGNOSIS — K219 Gastro-esophageal reflux disease without esophagitis: Secondary | ICD-10-CM | POA: Diagnosis not present

## 2017-04-24 DIAGNOSIS — Z9884 Bariatric surgery status: Secondary | ICD-10-CM

## 2017-04-24 DIAGNOSIS — N921 Excessive and frequent menstruation with irregular cycle: Secondary | ICD-10-CM | POA: Insufficient documentation

## 2017-04-24 NOTE — Progress Notes (Signed)
Jonathon Bellows MD, MRCP(U.K) [address]  [place]  [place], [place] [address]  Main: [phone]  Fax: [phone]   Gastroenterology Consultation  Referring Provider:     Ladora Daniel, MD Primary Care Physician:  [facility], [facility] Primary Gastroenterologist:  Dr. Jonathon Bellows  Reason for Consultation:     Heartburn        HPI:   Kristina Gomez is a 51 y.o. y/o female referred for consultation & management  by Dr. Domingo Madeira, [facility].    She has been referred for heartburn. She is having a bariatric surgery - and needs an EGD prior She has had heartburn all her life, she takes protonix with control of symptoms. Since 07/2016 she started a weight loss program and lost 67 lbs, she has noticed an improvement in her reflux since. She takes Oxycodone for her back. She has her dinner at late . Explained the need to eat smaller meals more often rather large meals. Avoid food for 2-3 hours before bedtime. She says she has a hiatal hernia.   Past Medical History:  Diagnosis Date  . Anxiety   . Arthritis   . Asthma   . Chronic pain   . Chronic pain syndrome 04/10/2015  . CPAP (continuous positive airway pressure) dependence 01/22/2016  . Degenerative arthritis of hip 04/10/2015  . Depression   . Diaphragmatic hernia 04/10/2015  . GERD (gastroesophageal reflux disease)   . Headache   . Hearing loss   . Hiatal hernia   . Hypertension   . Migraines   . Neuromuscular disorder ([place])    peripheral neuropathy  . Osteoporosis   . Reflux   . Shortness of breath dyspnea   . Sleep apnea   . Vision loss     Past Surgical History:  Procedure Laterality Date  . DILATION AND CURETTAGE OF UTERUS    . INNER EAR SURGERY Bilateral     Prior to Admission medications   Medication Sig Start Date End Date Taking? Authorizing Provider  busPIRone (BUSPAR) 15 MG tablet  10/19/15  Yes [provider]  Cholecalciferol (VITAMIN D3) 2000 units capsule TAKE 1  CAPSULE (2,000 UNITS TOTAL) BY MOUTH DAILY. 10/13/15  Yes [provider]  Cyanocobalamin 1500 MCG TBDP Take by mouth. 08/23/15  Yes [provider]  levothyroxine (SYNTHROID, LEVOTHROID) 150 MCG tablet TAKE 1 TABLET BY MOUTH EVERY DAY 01/22/16  Yes [provider]  nitrofurantoin, macrocrystal-monohydrate, (MACROBID) 100 MG capsule Take by mouth. 02/18/17  Yes [provider]  potassium chloride (KLOR-CON 10) 10 MEQ tablet TAKE 1 BY MOUTH DAILY FOR LOW POTASSIUM 08/15/15  Yes [provider]  acetaminophen (TYLENOL) 500 MG tablet Take 1,000 mg by mouth every 6 (six) hours as needed.    [provider]  albuterol-ipratropium (COMBIVENT) 18-103 MCG/ACT inhaler Inhale into the lungs every 6 (six) hours as needed for wheezing or shortness of breath.    [provider]  Azelastine HCl 0.15 % SOLN Place into the nose.    [provider]  baclofen (LIORESAL) 10 MG tablet Take 1 tablet (10 mg total) by mouth at bedtime as needed for muscle spasms. Hold Flexeril when taking this medication. 04/25/17 07/24/17  Vevelyn Francois, NP  citalopram (CELEXA) 40 MG tablet 40 mg qd 09/06/15   [provider]  cyclobenzaprine (FLEXERIL) 10 MG tablet Take 1 tablet (10 mg total) by mouth 3 (three) times daily as needed for muscle spasms. 04/25/17 07/24/17  Dionisio David M, NP  EPINEPHrine 0.3 mg/0.3 mL IJ SOAJ injection Inject into the muscle.    [provider]  fluticasone (FLONASE) 50 MCG/ACT nasal spray Place into the nose.    [provider]  Fluticasone-Salmeterol (ADVAIR DISKUS) 500-50 MCG/DOSE AEPB Inhale into the lungs.    [provider]  furosemide (LASIX) 20 MG tablet Take 1 tablet (20 mg total) by mouth daily. Patient taking differently: Take 40 mg by mouth daily.  08/27/15 08/31/16  Daymon Larsen, MD  gabapentin (NEURONTIN) 300 MG capsule Take 1-2 capsules (300-600 mg total) by mouth 3 (three) times daily.  Follow written titration schedule. 03/26/17 06/24/17  Vevelyn Francois, NP  gentamicin ointment (GARAMYCIN) 0.1 % Apply topically.    [provider]  lisinopril (PRINIVIL,ZESTRIL) 40 MG tablet Take 40 mg by mouth daily.    [provider]  lisinopril-hydrochlorothiazide (PRINZIDE,ZESTORETIC) 20-25 MG tablet Take by mouth.    [provider]  meloxicam (MOBIC) 15 MG tablet Take 1 tablet (15 mg total) by mouth daily. 02/20/17   Hyatt, Max T, DPM  mirabegron ER (MYRBETRIQ) 25 MG TB24 tablet Take 25 mg by mouth daily.    [provider]  oxyCODONE (OXY IR/ROXICODONE) 5 MG immediate release tablet Take 1 tablet (5 mg total) by mouth every 6 (six) hours as needed for severe pain. 04/25/17 05/25/17  Vevelyn Francois, NP  oxyCODONE (OXY IR/ROXICODONE) 5 MG immediate release tablet Take 1 tablet (5 mg total) by mouth every 6 (six) hours as needed for severe pain. 05/25/17 06/24/17  Vevelyn Francois, NP  oxyCODONE (OXY IR/ROXICODONE) 5 MG immediate release tablet Take 1 tablet (5 mg total) by mouth every 6 (six) hours as needed for severe pain. 06/24/17 07/24/17  Vevelyn Francois, NP  pantoprazole (PROTONIX) 40 MG tablet Take 40 mg by mouth 2 (two) times daily.    [provider]  solifenacin (VESICARE) 10 MG tablet Take by mouth.    [provider]  traZODone (DESYREL) 100 MG tablet Take by mouth.    [provider]    Family History  Problem Relation Age of Onset  . Diabetes Mother   . Heart disease Mother   . Kidney disease Mother   . COPD Father   . Breast cancer Paternal Aunt 28     Social History   Tobacco Use  . Smoking status: Former Smoker    Last attempt to quit: 11/02/1997    Years since quitting: 19.4  . Smokeless tobacco: Never Used  Substance Use Topics  . Alcohol use: No    Alcohol/week: 0.0 oz  . Drug use: No    Allergies as of 04/24/2017 - Review Complete 04/24/2017  Allergen Reaction Noted  . Contrast media [iodinated  diagnostic agents] Swelling 04/13/2015  . Other Hives and Other (See Comments) 01/10/2014  . Sulfa antibiotics Rash and Hives 01/10/2014    Review of Systems:    All systems reviewed and negative except where noted in HPI.   Physical Exam:  BP 109/79   Pulse 81   Temp 97.7 F (36.5 C) (Oral)   Wt 269 lb 9.6 oz (122.3 kg)   BMI 42.23 kg/m  No LMP recorded. Patient is not currently having periods (Reason: Perimenopausal). Psych:  Alert and cooperative. Normal mood and affect. General:   Alert,  Well-developed, well-nourished, pleasant and cooperative in NAD Head:  Normocephalic and atraumatic. Eyes:  Sclera clear, no icterus.   Conjunctiva pink. Ears:  Normal auditory  acuity. Nose:  No deformity, discharge, or lesions. Mouth:  No deformity or lesions,oropharynx pink & moist. Neck:  Supple; no masses or thyromegaly. Lungs:  Respirations even and unlabored.  Clear throughout to auscultation.   No wheezes, crackles, or rhonchi. No acute distress. Heart:  Regular rate and rhythm; no murmurs, clicks, rubs, or gallops. Abdomen:  Normal bowel sounds.  No bruits.  Soft, non-tender and non-distended without masses, hepatosplenomegaly or hernias noted.  No guarding or rebound tenderness.    Neurologic:  Alert and oriented x3;  grossly normal neurologically. Skin:  Intact without significant lesions or rashes. No jaundice. Lymph Nodes:  No significant cervical adenopathy. Psych:  Alert and cooperative. Normal mood and affect.  Imaging Studies: No results found.  Assessment and Plan:   Kristina Gomez is a 51 y.o. y/o female has been referred for GERD and an EGD prior to bariatric surgery   Plan  1. EGD 2. Counseled on life style changes, suggest to use PPI first thing in the morning on empty stomach and eat 30 minutes after. Advised on the use of a wedge pillow at night , avoid meals for 2 hours prior to bed time. Weight loss.Discussed the risks and benefits of long term PPI use including  but not limited to bone loss, chronic kidney disease, infections , low magnesium . Aim to use at the lowest dose for the shortest period of time   I have discussed alternative options, risks & benefits,  which include, but are not limited to, bleeding, infection, perforation,respiratory complication & drug reaction.  The patient agrees with this plan & written consent will be obtained.     Follow up in 6 months   Dr Jonathon Bellows MD,MRCP(U.K)

## 2017-05-05 ENCOUNTER — Encounter: Admission: RE | Payer: Self-pay | Source: Ambulatory Visit

## 2017-05-05 ENCOUNTER — Ambulatory Visit: Admission: RE | Admit: 2017-05-05 | Payer: Medicare Other | Source: Ambulatory Visit | Admitting: Gastroenterology

## 2017-05-05 SURGERY — ESOPHAGOGASTRODUODENOSCOPY (EGD) WITH PROPOFOL
Anesthesia: General

## 2017-05-08 ENCOUNTER — Ambulatory Visit: Payer: Medicare Other | Admitting: Anesthesiology

## 2017-05-08 ENCOUNTER — Encounter: Payer: Self-pay | Admitting: Anesthesiology

## 2017-05-08 ENCOUNTER — Ambulatory Visit
Admission: RE | Admit: 2017-05-08 | Discharge: 2017-05-08 | Disposition: A | Discharge status: Home / Self Care | Payer: Medicare Other | Source: Ambulatory Visit | Attending: Gastroenterology | Admitting: Gastroenterology

## 2017-05-08 ENCOUNTER — Encounter
Admission: RE | Disposition: A | Discharge status: Home / Self Care | Payer: Self-pay | Source: Ambulatory Visit | Attending: Gastroenterology

## 2017-05-08 DIAGNOSIS — M797 Fibromyalgia: Secondary | ICD-10-CM | POA: Insufficient documentation

## 2017-05-08 DIAGNOSIS — F419 Anxiety disorder, unspecified: Secondary | ICD-10-CM | POA: Diagnosis not present

## 2017-05-08 DIAGNOSIS — G629 Polyneuropathy, unspecified: Secondary | ICD-10-CM | POA: Diagnosis not present

## 2017-05-08 DIAGNOSIS — I10 Essential (primary) hypertension: Secondary | ICD-10-CM | POA: Insufficient documentation

## 2017-05-08 DIAGNOSIS — E039 Hypothyroidism, unspecified: Secondary | ICD-10-CM | POA: Diagnosis not present

## 2017-05-08 DIAGNOSIS — F329 Major depressive disorder, single episode, unspecified: Secondary | ICD-10-CM | POA: Diagnosis not present

## 2017-05-08 DIAGNOSIS — G894 Chronic pain syndrome: Secondary | ICD-10-CM | POA: Insufficient documentation

## 2017-05-08 DIAGNOSIS — Z01818 Encounter for other preprocedural examination: Secondary | ICD-10-CM | POA: Diagnosis not present

## 2017-05-08 DIAGNOSIS — K224 Dyskinesia of esophagus: Secondary | ICD-10-CM | POA: Insufficient documentation

## 2017-05-08 DIAGNOSIS — K449 Diaphragmatic hernia without obstruction or gangrene: Secondary | ICD-10-CM | POA: Diagnosis not present

## 2017-05-08 DIAGNOSIS — M1990 Unspecified osteoarthritis, unspecified site: Secondary | ICD-10-CM | POA: Diagnosis not present

## 2017-05-08 DIAGNOSIS — Z91041 Radiographic dye allergy status: Secondary | ICD-10-CM | POA: Diagnosis not present

## 2017-05-08 DIAGNOSIS — J45909 Unspecified asthma, uncomplicated: Secondary | ICD-10-CM | POA: Diagnosis not present

## 2017-05-08 DIAGNOSIS — M810 Age-related osteoporosis without current pathological fracture: Secondary | ICD-10-CM | POA: Diagnosis not present

## 2017-05-08 DIAGNOSIS — Z79899 Other long term (current) drug therapy: Secondary | ICD-10-CM | POA: Insufficient documentation

## 2017-05-08 DIAGNOSIS — K210 Gastro-esophageal reflux disease with esophagitis: Secondary | ICD-10-CM | POA: Diagnosis not present

## 2017-05-08 DIAGNOSIS — Z882 Allergy status to sulfonamides status: Secondary | ICD-10-CM | POA: Diagnosis not present

## 2017-05-08 DIAGNOSIS — Z87891 Personal history of nicotine dependence: Secondary | ICD-10-CM | POA: Diagnosis not present

## 2017-05-08 HISTORY — PX: ESOPHAGOGASTRODUODENOSCOPY (EGD) WITH PROPOFOL: SHX5813

## 2017-05-08 LAB — POCT PREGNANCY, URINE: PREG TEST UR: NEGATIVE

## 2017-05-08 SURGERY — ESOPHAGOGASTRODUODENOSCOPY (EGD) WITH PROPOFOL
Anesthesia: General

## 2017-05-08 MED ORDER — LIDOCAINE 2% (20 MG/ML) 5 ML SYRINGE
INTRAMUSCULAR | Status: DC | PRN
  Administered 2017-05-08: 40 mg via INTRAVENOUS

## 2017-05-08 MED ORDER — LIDOCAINE HCL (PF) 2 % IJ SOLN
INTRAMUSCULAR | Status: AC
  Filled 2017-05-08: qty 10

## 2017-05-08 MED ORDER — PROPOFOL 500 MG/50ML IV EMUL
INTRAVENOUS | Status: AC
  Filled 2017-05-08: qty 50

## 2017-05-08 MED ORDER — SODIUM CHLORIDE 0.9 % IV SOLN: INTRAVENOUS | Status: DC

## 2017-05-08 MED ORDER — FENTANYL CITRATE (PF) 100 MCG/2ML IJ SOLN
INTRAMUSCULAR | Status: DC | PRN
  Administered 2017-05-08: 50 ug via INTRAVENOUS

## 2017-05-08 MED ORDER — PROPOFOL 10 MG/ML IV BOLUS
INTRAVENOUS | Status: AC
  Filled 2017-05-08: qty 20

## 2017-05-08 MED ORDER — FENTANYL CITRATE (PF) 100 MCG/2ML IJ SOLN
INTRAMUSCULAR | Status: AC
  Filled 2017-05-08: qty 2

## 2017-05-08 MED ORDER — PROPOFOL 10 MG/ML IV BOLUS
INTRAVENOUS | Status: DC | PRN
  Administered 2017-05-08: 100 mg via INTRAVENOUS

## 2017-05-08 MED ORDER — SODIUM CHLORIDE 0.9 % IV SOLN
INTRAVENOUS | Status: DC | PRN
  Administered 2017-05-08: 09:00:00 via INTRAVENOUS

## 2017-05-08 MED ORDER — PROPOFOL 500 MG/50ML IV EMUL
INTRAVENOUS | Status: DC | PRN
  Administered 2017-05-08: 150 ug/kg/min via INTRAVENOUS

## 2017-05-08 NOTE — Op Note (Signed)
[facility] Patient Name: Kristina Gomez Procedure Date: 05/08/2017 8:58 AM MRN: [id number] Account #: [id number] Date of Birth: [birth year] Admit Type: Outpatient Age: 51 Room: [facility] ENDO ROOM 3 Gender: Female Note Status: Finalized Procedure:            Upper GI endoscopy Indications:          Preoperative assessment for bariatric surgery to treat                        morbid obesity Providers:            Jonathon Bellows MD, MD Medicines:            Monitored Anesthesia Care Complications:        No immediate complications. Procedure:            Pre-Anesthesia Assessment:                       - Prior to the procedure, a History and Physical was                        performed, and patient medications, allergies and                        sensitivities were reviewed. The patient's tolerance of                        previous anesthesia was reviewed.                       - The risks and benefits of the procedure and the                        sedation options and risks were discussed with the                        patient. All questions were answered and informed                        consent was obtained.                       - ASA Grade Assessment: III - A patient with severe                        systemic disease.                       After obtaining informed consent, the endoscope was                        passed under direct vision. Throughout the procedure,                        the patient's blood pressure, pulse, and oxygen                        saturations were monitored continuously. The Endoscope                        was introduced through the mouth, and advanced to the  third part of duodenum. The upper GI endoscopy was                        accomplished with ease. The patient tolerated the                        procedure well. Findings:      The examined duodenum was normal.      A 3 cm hiatal hernia was  present.      Abnormal motility was noted in the proximal esophagus, in the mid       esophagus and in the distal esophagus. There is a decrease in motility       of the esophageal body.      Islands of salmon-colored mucosa were present from 39 to 40 cm. The       maximum longitudinal extent of these esophageal mucosal changes was 0.5       cm in length. Biopsies were taken with a cold forceps for histology.      The cardia and gastric fundus were normal on retroflexion. Impression:           - Normal examined duodenum.                       - 3 cm hiatal hernia.                       - Abnormal esophageal motility.                       - Salmon-colored mucosa suspicious for short-segment                        Barrett's esophagus. Biopsied. Recommendation:       - Await pathology results.                       - Discharge patient to home (with escort).                       - Resume previous diet.                       - Continue present medications.                       - Return to my office PRN. Procedure Code(s):    --- Professional ---                       [phone], Esophagogastroduodenoscopy, flexible, transoral;                        with biopsy, single or multiple Diagnosis Code(s):    --- Professional ---                       K22.8, Other specified diseases of esophagus                       K44.9, Diaphragmatic hernia without obstruction or                        gangrene  K22.4, Dyskinesia of esophagus                       Z01.818, Encounter for other preprocedural examination                       E66.01, Morbid (severe) obesity due to excess calories CPT copyright 2016 American Medical Association. All rights reserved. The codes documented in this report are preliminary and upon coder review may  be revised to meet current compliance requirements. Jonathon Bellows, MD Jonathon Bellows MD, MD 05/08/2017 9:35:02 AM This report has been signed  electronically. Number of Addenda: 0 Note Initiated On: 05/08/2017 8:58 AM      [facility]

## 2017-05-08 NOTE — Anesthesia Postprocedure Evaluation (Signed)
Anesthesia Post Note  Patient: MAYSA LYNN  Procedure(s) Performed: ESOPHAGOGASTRODUODENOSCOPY (EGD) WITH PROPOFOL (N/A )  Patient location during evaluation: Endoscopy Anesthesia Type: General Level of consciousness: awake and alert Pain management: pain level controlled Vital Signs Assessment: post-procedure vital signs reviewed and stable Respiratory status: spontaneous breathing, nonlabored ventilation, respiratory function stable and patient connected to nasal cannula oxygen Cardiovascular status: blood pressure returned to baseline and stable Postop Assessment: no apparent nausea or vomiting Anesthetic complications: no     Last Vitals:  Vitals:   05/08/17 0957 05/08/17 1007  BP: 123/79 125/81  Pulse: 61 60  Resp: 15 14  Temp:    SpO2: 94% 96%    Last Pain:  Vitals:   05/08/17 0937  TempSrc: Tympanic                 Lesli Issa S

## 2017-05-08 NOTE — Transfer of Care (Signed)
Immediate Anesthesia Transfer of Care Note  Patient: Kristina Gomez  Procedure(s) Performed: ESOPHAGOGASTRODUODENOSCOPY (EGD) WITH PROPOFOL (N/A )  Patient Location: PACU and Endoscopy Unit  Anesthesia Type:General  Level of Consciousness: awake and patient cooperative  Airway & Oxygen Therapy: Patient Spontanous Breathing and Patient connected to nasal cannula oxygen  Post-op Assessment: Report given to RN and Post -op Vital signs reviewed and stable  Post vital signs: Reviewed and stable  Last Vitals:  Vitals:   05/08/17 0854  BP: 139/79  Pulse: 73  Resp: 16  Temp: (!) 36.3 C    Last Pain:  Vitals:   05/08/17 0854  TempSrc: Tympanic         Complications: No apparent anesthesia complications

## 2017-05-08 NOTE — Anesthesia Post-op Follow-up Note (Signed)
Anesthesia QCDR form completed.        

## 2017-05-08 NOTE — H&P (Signed)
Jonathon Bellows, MD [address], [place], [place], [place], [address] [address], [place], [place], [place], [address] Phone: [phone]  Fax: [phone]  Primary Care Physician:  [facility]   Pre-Procedure History & Physical: HPI:  Kristina Gomez is a 51 y.o. female is here for an endoscopy    Past Medical History:  Diagnosis Date  . Anxiety   . Arthritis   . Asthma   . Chronic pain   . Chronic pain syndrome 04/10/2015  . CPAP (continuous positive airway pressure) dependence 01/22/2016  . Degenerative arthritis of hip 04/10/2015  . Depression   . Diaphragmatic hernia 04/10/2015  . GERD (gastroesophageal reflux disease)   . Headache   . Hearing loss   . Hiatal hernia   . Hypertension   . Migraines   . Neuromuscular disorder ([place])    peripheral neuropathy  . Osteoporosis   . Reflux   . Shortness of breath dyspnea   . Sleep apnea    No longer has due to >70 lbs weight loss  . Vision loss     Past Surgical History:  Procedure Laterality Date  . DILATION AND CURETTAGE OF UTERUS    . INNER EAR SURGERY Bilateral     Prior to Admission medications   Medication Sig Start Date End Date Taking? Authorizing Provider  albuterol-ipratropium (COMBIVENT) 18-103 MCG/ACT inhaler Inhale into the lungs every 6 (six) hours as needed for wheezing or shortness of breath.   Yes [provider]  baclofen (LIORESAL) 10 MG tablet Take 1 tablet (10 mg total) by mouth at bedtime as needed for muscle spasms. Hold Flexeril when taking this medication. 04/25/17 07/24/17 Yes King, Diona Foley, NP  busPIRone (BUSPAR) 15 MG tablet 15 mg 2 (two) times daily.  10/19/15  Yes [provider]  cyclobenzaprine (FLEXERIL) 10 MG tablet Take 1 tablet (10 mg total) by mouth 3 (three) times daily as needed for muscle spasms. 04/25/17 07/24/17 Yes Vevelyn Francois, NP  gabapentin (NEURONTIN) 300 MG capsule Take 1-2 capsules (300-600 mg total) by mouth 3 (three) times  daily. Follow written titration schedule. 03/26/17 06/24/17 Yes King, Diona Foley, NP  lisinopril (PRINIVIL,ZESTRIL) 40 MG tablet Take 40 mg by mouth daily.   Yes [provider]  meloxicam (MOBIC) 15 MG tablet Take 1 tablet (15 mg total) by mouth daily. 02/20/17  Yes Hyatt, Max T, DPM  mirabegron ER (MYRBETRIQ) 25 MG TB24 tablet Take 25 mg by mouth daily.   Yes [provider]  oxyCODONE (OXY IR/ROXICODONE) 5 MG immediate release tablet Take 1 tablet (5 mg total) by mouth every 6 (six) hours as needed for severe pain. 05/25/17 06/24/17 Yes King, Diona Foley, NP  pantoprazole (PROTONIX) 40 MG tablet Take 40 mg by mouth 2 (two) times daily.   Yes [provider]  potassium chloride (KLOR-CON 10) 10 MEQ tablet TAKE 1 BY MOUTH DAILY FOR LOW POTASSIUM 08/15/15  Yes [provider]  traZODone (DESYREL) 100 MG tablet Take by mouth.   Yes [provider]  acetaminophen (TYLENOL) 500 MG tablet Take 1,000 mg by mouth every 6 (six) hours as needed.    [provider]  EPINEPHrine 0.3 mg/0.3 mL IJ SOAJ injection Inject into the muscle.    [provider]  furosemide (LASIX) 20 MG tablet Take 1 tablet (20 mg total) by mouth daily. Patient taking differently: Take 40 mg by mouth daily as needed.  08/27/15 08/31/16  Daymon Larsen,  MD    Allergies as of 05/05/2017 - Review Complete 04/24/2017  Allergen Reaction Noted  . Contrast media [iodinated diagnostic agents] Swelling 04/13/2015  . Other Hives and Other (See Comments) 01/10/2014  . Sulfa antibiotics Rash and Hives 01/10/2014    Family History  Problem Relation Age of Onset  . Diabetes Mother   . Heart disease Mother   . Kidney disease Mother   . COPD Father   . Breast cancer Paternal Aunt 69    Social History   Socioeconomic History  . Marital status: Single    Spouse name: Not on file  . Number of children: Not on file  . Years of education: Not on file  . Highest education level: Not  on file  Social Needs  . Financial resource strain: Not on file  . Food insecurity - worry: Not on file  . Food insecurity - inability: Not on file  . Transportation needs - medical: Not on file  . Transportation needs - non-medical: Not on file  Occupational History  . Not on file  Tobacco Use  . Smoking status: Former Smoker    Last attempt to quit: 11/02/1997    Years since quitting: 19.5  . Smokeless tobacco: Never Used  Substance and Sexual Activity  . Alcohol use: No    Alcohol/week: 0.0 oz  . Drug use: No  . Sexual activity: Not on file  Other Topics Concern  . Not on file  Social History Narrative  . Not on file    Review of Systems: See HPI, otherwise negative ROS  Physical Exam: LMP 07/11/2016 (Approximate) Comment: No period in 10 months General:   Alert,  pleasant and cooperative in NAD Head:  Normocephalic and atraumatic. Neck:  Supple; no masses or thyromegaly. Lungs:  Clear throughout to auscultation, normal respiratory effort.    Heart:  +S1, +S2, Regular rate and rhythm, No edema. Abdomen:  Soft, nontender and nondistended. Normal bowel sounds, without guarding, and without rebound.   Neurologic:  Alert and  oriented x4;  grossly normal neurologically.  Impression/Plan: Kristina Gomez is here for an endoscopy  to be performed for  evaluation of GI tract prior to bariatric surgery    Risks, benefits, limitations, and alternatives regarding endoscopy have been reviewed with the patient.  Questions have been answered.  All parties agreeable.   Jonathon Bellows, MD  05/08/2017, 8:56 AM

## 2017-05-08 NOTE — Anesthesia Preprocedure Evaluation (Signed)
Anesthesia Evaluation  Patient identified by MRN, date of birth, ID band Patient awake    Reviewed: Allergy & Precautions, NPO status , Patient's Chart, lab work & pertinent test results, reviewed documented beta blocker date and time   Airway Mallampati: III  TM Distance: >3 FB     Dental  (+) Chipped, Partial Lower, Partial Upper   Pulmonary shortness of breath, asthma , sleep apnea , former smoker,           Cardiovascular hypertension, Pt. on medications      Neuro/Psych  Headaches, PSYCHIATRIC DISORDERS Anxiety Depression  Neuromuscular disease    GI/Hepatic hiatal hernia, GERD  ,  Endo/Other  Hypothyroidism Morbid obesity  Renal/GU      Musculoskeletal  (+) Arthritis , Fibromyalgia -  Abdominal   Peds  Hematology   Anesthesia Other Findings Denies hx of TBI.  Reproductive/Obstetrics                             Anesthesia Physical Anesthesia Plan  ASA: III  Anesthesia Plan: General   Post-op Pain Management:    Induction: Intravenous  PONV Risk Score and Plan:   Airway Management Planned:   Additional Equipment:   Intra-op Plan:   Post-operative Plan:   Informed Consent: I have reviewed the patients History and Physical, chart, labs and discussed the procedure including the risks, benefits and alternatives for the proposed anesthesia with the patient or authorized representative who has indicated his/her understanding and acceptance.     Plan Discussed with: CRNA  Anesthesia Plan Comments:         Anesthesia Quick Evaluation

## 2017-05-09 LAB — SURGICAL PATHOLOGY

## 2017-05-13 ENCOUNTER — Telehealth: Payer: Self-pay

## 2017-05-13 NOTE — Telephone Encounter (Signed)
LVM for patient callback for results per Dr. Vicente Males.   - No barrettes seen , only features of reflux and related esophagitis, continue PPI, adhere to life style advise.

## 2017-05-13 NOTE — Telephone Encounter (Signed)
-----   Message from Jonathon Bellows, MD sent at 05/11/2017  5:37 PM EST ----- No barrettes seen , only features of reflux and related esophagitis, continue PPI, adhere to life style advise.

## 2017-05-14 ENCOUNTER — Telehealth: Payer: Self-pay | Admitting: Gastroenterology

## 2017-05-14 NOTE — Telephone Encounter (Signed)
Patient left a voice message returning your call for results

## 2017-06-06 ENCOUNTER — Ambulatory Visit (HOSPITAL_COMMUNITY): Payer: Medicare Other | Admitting: Licensed Clinical Social Worker

## 2017-06-26 ENCOUNTER — Ambulatory Visit: Payer: Medicare Other | Attending: Nurse Practitioner | Admitting: Nurse Practitioner

## 2017-06-26 ENCOUNTER — Other Ambulatory Visit: Payer: Self-pay

## 2017-06-26 ENCOUNTER — Encounter: Payer: Self-pay | Admitting: Nurse Practitioner

## 2017-06-26 VITALS — BP 112/66 | HR 65 | Temp 98.0°F | Resp 18 | Ht 67.0 in | Wt 264.0 lb

## 2017-06-26 DIAGNOSIS — M1610 Unilateral primary osteoarthritis, unspecified hip: Secondary | ICD-10-CM | POA: Diagnosis not present

## 2017-06-26 DIAGNOSIS — M79641 Pain in right hand: Secondary | ICD-10-CM | POA: Insufficient documentation

## 2017-06-26 DIAGNOSIS — R252 Cramp and spasm: Secondary | ICD-10-CM

## 2017-06-26 DIAGNOSIS — Z79891 Long term (current) use of opiate analgesic: Secondary | ICD-10-CM | POA: Diagnosis not present

## 2017-06-26 DIAGNOSIS — M25511 Pain in right shoulder: Secondary | ICD-10-CM | POA: Insufficient documentation

## 2017-06-26 DIAGNOSIS — M792 Neuralgia and neuritis, unspecified: Secondary | ICD-10-CM

## 2017-06-26 DIAGNOSIS — G4733 Obstructive sleep apnea (adult) (pediatric): Secondary | ICD-10-CM | POA: Insufficient documentation

## 2017-06-26 DIAGNOSIS — M1612 Unilateral primary osteoarthritis, left hip: Secondary | ICD-10-CM | POA: Insufficient documentation

## 2017-06-26 DIAGNOSIS — Z6841 Body Mass Index (BMI) 40.0 and over, adult: Secondary | ICD-10-CM | POA: Insufficient documentation

## 2017-06-26 DIAGNOSIS — E538 Deficiency of other specified B group vitamins: Secondary | ICD-10-CM | POA: Diagnosis not present

## 2017-06-26 DIAGNOSIS — M545 Low back pain: Secondary | ICD-10-CM | POA: Diagnosis present

## 2017-06-26 DIAGNOSIS — M488X6 Other specified spondylopathies, lumbar region: Secondary | ICD-10-CM | POA: Diagnosis not present

## 2017-06-26 DIAGNOSIS — G894 Chronic pain syndrome: Secondary | ICD-10-CM | POA: Diagnosis not present

## 2017-06-26 DIAGNOSIS — K219 Gastro-esophageal reflux disease without esophagitis: Secondary | ICD-10-CM | POA: Diagnosis not present

## 2017-06-26 DIAGNOSIS — F419 Anxiety disorder, unspecified: Secondary | ICD-10-CM | POA: Insufficient documentation

## 2017-06-26 DIAGNOSIS — Z791 Long term (current) use of non-steroidal anti-inflammatories (NSAID): Secondary | ICD-10-CM | POA: Insufficient documentation

## 2017-06-26 DIAGNOSIS — G43909 Migraine, unspecified, not intractable, without status migrainosus: Secondary | ICD-10-CM | POA: Diagnosis not present

## 2017-06-26 DIAGNOSIS — F329 Major depressive disorder, single episode, unspecified: Secondary | ICD-10-CM | POA: Diagnosis not present

## 2017-06-26 DIAGNOSIS — Z841 Family history of disorders of kidney and ureter: Secondary | ICD-10-CM | POA: Insufficient documentation

## 2017-06-26 DIAGNOSIS — M533 Sacrococcygeal disorders, not elsewhere classified: Secondary | ICD-10-CM | POA: Insufficient documentation

## 2017-06-26 DIAGNOSIS — Z882 Allergy status to sulfonamides status: Secondary | ICD-10-CM | POA: Insufficient documentation

## 2017-06-26 DIAGNOSIS — Z8249 Family history of ischemic heart disease and other diseases of the circulatory system: Secondary | ICD-10-CM | POA: Insufficient documentation

## 2017-06-26 DIAGNOSIS — M810 Age-related osteoporosis without current pathological fracture: Secondary | ICD-10-CM | POA: Insufficient documentation

## 2017-06-26 DIAGNOSIS — M25512 Pain in left shoulder: Secondary | ICD-10-CM | POA: Insufficient documentation

## 2017-06-26 DIAGNOSIS — Z79899 Other long term (current) drug therapy: Secondary | ICD-10-CM | POA: Insufficient documentation

## 2017-06-26 DIAGNOSIS — G8929 Other chronic pain: Secondary | ICD-10-CM

## 2017-06-26 DIAGNOSIS — Z803 Family history of malignant neoplasm of breast: Secondary | ICD-10-CM | POA: Insufficient documentation

## 2017-06-26 DIAGNOSIS — M797 Fibromyalgia: Secondary | ICD-10-CM | POA: Diagnosis not present

## 2017-06-26 DIAGNOSIS — Z87891 Personal history of nicotine dependence: Secondary | ICD-10-CM | POA: Insufficient documentation

## 2017-06-26 DIAGNOSIS — J45909 Unspecified asthma, uncomplicated: Secondary | ICD-10-CM | POA: Insufficient documentation

## 2017-06-26 DIAGNOSIS — Z5181 Encounter for therapeutic drug level monitoring: Secondary | ICD-10-CM | POA: Diagnosis not present

## 2017-06-26 DIAGNOSIS — H9190 Unspecified hearing loss, unspecified ear: Secondary | ICD-10-CM | POA: Diagnosis not present

## 2017-06-26 DIAGNOSIS — M4726 Other spondylosis with radiculopathy, lumbar region: Secondary | ICD-10-CM | POA: Diagnosis not present

## 2017-06-26 DIAGNOSIS — H547 Unspecified visual loss: Secondary | ICD-10-CM | POA: Insufficient documentation

## 2017-06-26 DIAGNOSIS — I10 Essential (primary) hypertension: Secondary | ICD-10-CM | POA: Diagnosis not present

## 2017-06-26 DIAGNOSIS — Z833 Family history of diabetes mellitus: Secondary | ICD-10-CM | POA: Insufficient documentation

## 2017-06-26 DIAGNOSIS — M7918 Myalgia, other site: Secondary | ICD-10-CM | POA: Diagnosis not present

## 2017-06-26 DIAGNOSIS — M25552 Pain in left hip: Secondary | ICD-10-CM

## 2017-06-26 DIAGNOSIS — M488X2 Other specified spondylopathies, cervical region: Secondary | ICD-10-CM | POA: Insufficient documentation

## 2017-06-26 DIAGNOSIS — M47818 Spondylosis without myelopathy or radiculopathy, sacral and sacrococcygeal region: Secondary | ICD-10-CM | POA: Insufficient documentation

## 2017-06-26 DIAGNOSIS — M79642 Pain in left hand: Secondary | ICD-10-CM | POA: Insufficient documentation

## 2017-06-26 DIAGNOSIS — Z91041 Radiographic dye allergy status: Secondary | ICD-10-CM | POA: Insufficient documentation

## 2017-06-26 DIAGNOSIS — M5416 Radiculopathy, lumbar region: Secondary | ICD-10-CM | POA: Diagnosis not present

## 2017-06-26 DIAGNOSIS — Z825 Family history of asthma and other chronic lower respiratory diseases: Secondary | ICD-10-CM | POA: Insufficient documentation

## 2017-06-26 DIAGNOSIS — Z87820 Personal history of traumatic brain injury: Secondary | ICD-10-CM | POA: Insufficient documentation

## 2017-06-26 DIAGNOSIS — G4700 Insomnia, unspecified: Secondary | ICD-10-CM | POA: Insufficient documentation

## 2017-06-26 DIAGNOSIS — E7800 Pure hypercholesterolemia, unspecified: Secondary | ICD-10-CM | POA: Insufficient documentation

## 2017-06-26 MED ORDER — CYCLOBENZAPRINE HCL 10 MG PO TABS: 10.0000 mg | ORAL_TABLET | Freq: Three times a day (TID) | ORAL | 2 refills | Status: DC | PRN

## 2017-06-26 MED ORDER — GABAPENTIN 300 MG PO CAPS: 300.0000 mg | ORAL_CAPSULE | Freq: Three times a day (TID) | ORAL | 2 refills | Status: DC

## 2017-06-26 MED ORDER — OXYCODONE HCL 5 MG PO TABS: 5.0000 mg | ORAL_TABLET | Freq: Four times a day (QID) | ORAL | 0 refills | Status: DC | PRN

## 2017-06-26 MED ORDER — BACLOFEN 10 MG PO TABS: 10.0000 mg | ORAL_TABLET | Freq: Every evening | ORAL | 2 refills | Status: DC | PRN

## 2017-06-26 NOTE — Progress Notes (Signed)
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 bilateral lumbar facet radiofrequencyablation Diagnostic bilateral sacroiliac joint block Possible bilateral sacroiliac joint radiofrequencyablation Diagnostic bilateralintra-articular hip joint injection Possible bilateral hip radiofrequency ablation Diagnostic bilateralintra-articular shoulder joint injection Diagnostic bilateral suprascapular nerve block Possible bilateral suprascapular nerve radiofrequencyablation   Palliative PRN treatment(s):  Not at this time      Provider-requested follow-up: Return in 3 months (on 09/15/2017) for MedMgmt with Me Donella Stade [facility] Pace).  Future Appointments  Date Time Provider [place]  08/01/2017 11:00 AM Arfeen, Arlyce Harman, MD [facility] None  09/15/2017  9:30 AM Vevelyn Francois, NP [facility] None   Primary Care Physician: [facility] [place] Location: [facility] Outpatient Pain Management Facility Note  by: Vevelyn Francois NP Date: 06/26/2017; Time: 1:59 PM  Pain Score Disclaimer: We use the NRS-11 scale. This is a self-reported, subjective measurement of pain severity with only modest accuracy. It is used primarily to identify changes within a particular patient. It must be understood that outpatient pain scales are significantly less accurate that those used for research, where they can be applied under ideal controlled circumstances with minimal exposure to variables. In reality, the score is likely to be a combination of pain intensity and pain affect, where pain affect describes the degree of emotional arousal or changes in action readiness caused by the sensory experience of pain. Factors such as social and work situation, setting, emotional state, anxiety levels, expectation, and prior pain experience may influence pain perception and show large inter-individual differences that may also be affected by time variables.  Patient instructions provided during this appointment: Patient Instructions    ____________________________________________________________________________________________  Medication Rules  Applies to: All patients receiving prescriptions (written or electronic).  Pharmacy of record: Pharmacy where electronic prescriptions will be sent. If written prescriptions are taken to a different pharmacy, please inform the nursing staff. The pharmacy listed in the electronic medical record should be the one where you would like electronic prescriptions to be sent.  Prescription refills: Only during scheduled appointments. Applies to both, written and electronic prescriptions.  NOTE: The following applies primarily to controlled substances (Opioid* Pain Medications).   Patient's responsibilities: 1. Pain Pills: Bring all pain pills to every appointment (except for procedure appointments). 2. Pill Bottles: Bring pills in original pharmacy bottle. Always bring newest bottle. Bring  bottle, even if empty. 3. Medication refills: You are responsible for knowing and keeping track of what medications you need refilled. The day before your appointment, write a list of all prescriptions that need to be refilled. Bring that list to your appointment and give it to the admitting nurse. Prescriptions will be written only during appointments. If you forget a medication, it will not be "Called in", "Faxed", or "electronically sent". You will need to get another appointment to get these prescribed. 4. Prescription Accuracy: You are responsible for carefully inspecting your prescriptions before leaving our office. Have the discharge nurse carefully go over each prescription with you, before taking them home. Make sure that your name is accurately spelled, that your address is correct. Check the name and dose of your medication to make sure it is accurate. Check the number of pills, and the written instructions to make sure they are clear and accurate. Make sure that you are given enough medication to last until your next medication refill appointment. 5. Taking Medication: Take medication as prescribed. Never  take more pills than instructed. Never take medication more frequently than prescribed. Taking less pills or less frequently is permitted and encouraged, when it comes to controlled substances (written prescriptions).  6. Inform other Doctors: Always inform, all of your healthcare providers, of all the medications you take. 7. Pain Medication from other Providers: You are not allowed to accept any additional pain medication from any other Doctor or Healthcare provider. There are two exceptions to this rule. (see below) In the event that you require additional pain medication, you are responsible for notifying us, as stated below. 8. Medication Agreement: You are responsible for carefully reading and following our Medication Agreement. This must be signed before receiving any prescriptions from our  practice. Safely store a copy of your signed Agreement. Violations to the Agreement will result in no further prescriptions. (Additional copies of our Medication Agreement are available upon request.) 9. Laws, Rules, & Regulations: All patients are expected to follow all Federal and [employer], [employer], Rules, [employer]. Ignorance of the Laws does not constitute a valid excuse. The use of any illegal substances is prohibited. 10. Adopted CDC guidelines & recommendations: Target dosing levels will be at or below 60 MME/day. Use of benzodiazepines** is not recommended.  Exceptions: There are only two exceptions to the rule of not receiving pain medications from other Healthcare Providers. 1. Exception #1 (Emergencies): In the event of an emergency (i.e.: accident requiring emergency care), you are allowed to receive additional pain medication. However, you are responsible for: As soon as you are able, call our office [phone], at any time of the day or night, and leave a message stating your name, the date and nature of the emergency, and the name and dose of the medication prescribed. In the event that your call is answered by a member of our staff, make sure to document and save the date, time, and the name of the person that took your information.  2. Exception #2 (Planned Surgery): In the event that you are scheduled by another doctor or dentist to have any type of surgery or procedure, you are allowed (for a period no longer than 30 days), to receive additional pain medication, for the acute post-op pain. However, in this case, you are responsible for picking up a copy of our "Post-op Pain Management for Surgeons" handout, and giving it to your surgeon or dentist. This document is available at our office, and does not require an appointment to obtain it. Simply go to our office during business hours (Monday-Thursday from 8:00 AM to 4:00 PM) (Friday 8:00 AM to 12:00 Noon) or if you have a  scheduled appointment with [place], prior to your surgery, and ask for it by name. In addition, you will need to provide [place] with your name, name of your surgeon, type of surgery, and date of procedure or surgery.  *Opioid medications include: morphine, codeine, oxycodone, oxymorphone, hydrocodone, hydromorphone, meperidine, tramadol, tapentadol, buprenorphine, fentanyl, methadone. **Benzodiazepine medications include: diazepam (Valium), alprazolam (Xanax), clonazepam (Klonopine), lorazepam (Ativan), clorazepate (Tranxene), chlordiazepoxide (Librium), estazolam (Prosom), oxazepam (Serax), temazepam (Restoril), triazolam (Halcion)  ____________________________________________________________________________________________   BMI Assessment: Estimated body mass index is 41.35 kg/m as calculated from the following:   Height as of this encounter: _0  (1.702 m).   Weight as of this encounter: 264 lb (119.7 kg).  BMI interpretation table: BMI level Category Range association with higher incidence of chronic pain  <18 kg/m2 Underweight   18.5-24.9 kg/m2 Ideal body weight   25-29.9 kg/m2 Overweight  Increased incidence by 20%  30-34.9 kg/m2 Obese (Class I) Increased incidence by 68%  35-39.9 kg/m2 Severe obesity (Class II) Increased incidence by 136%  >40 kg/m2 Extreme obesity (Class III) Increased incidence by 254%   BMI Readings from Last 4 Encounters:  06/26/17 41.35 kg/m  05/08/17 42.13 kg/m  04/24/17 42.23 kg/m  03/26/17 42.13 kg/m   Wt Readings from Last 4 Encounters:  06/26/17 264 lb (119.7 kg)  05/08/17 269 lb (122 kg)  04/24/17 269 lb 9.6 oz (122.3 kg)  03/26/17 269 lb (122 kg)  Prescriptions for Baclofen, Flexeril, and Gabapentin were sent to your pharmacy. You were given 3 prescriptions for Oxycodone.

## 2017-06-26 NOTE — Progress Notes (Signed)
Nursing Pain Medication Assessment:  Safety precautions to be maintained throughout the outpatient stay will include: orient to surroundings, keep bed in low position, maintain call bell within reach at all times, provide assistance with transfer out of bed and ambulation.  Medication Inspection Compliance: Pill count conducted under aseptic conditions, in front of the patient. Neither the pills nor the bottle was removed from the patient's sight at any time. Once count was completed pills were immediately returned to the patient in their original bottle.  Medication: Morphine IR Pill/Patch Count: 2 of 120 pills remain Pill/Patch Appearance: Markings consistent with prescribed medication Bottle Appearance: Standard pharmacy container. Clearly labeled. Filled Date: 12/16/ 2018 Last Medication intake:  Today

## 2017-06-26 NOTE — Addendum Note (Signed)
Addended by: Vevelyn Francois on: 06/26/2017 03:51 PM   Modules accepted: Level of Service

## 2017-06-26 NOTE — Addendum Note (Signed)
Addended by: Vevelyn Francois on: 06/26/2017 04:05 PM   Modules accepted: Orders

## 2017-06-26 NOTE — Patient Instructions (Addendum)
____________________________________________________________________________________________  Medication Rules  Applies to: All patients receiving prescriptions (written or electronic).  Pharmacy of record: Pharmacy where electronic prescriptions will be sent. If written prescriptions are taken to a different pharmacy, please inform the nursing staff. The pharmacy listed in the electronic medical record should be the one where you would like electronic prescriptions to be sent.  Prescription refills: Only during scheduled appointments. Applies to both, written and electronic prescriptions.  NOTE: The following applies primarily to controlled substances (Opioid* Pain Medications).   Patient's responsibilities: 1. Pain Pills: Bring all pain pills to every appointment (except for procedure appointments). 2. Pill Bottles: Bring pills in original pharmacy bottle. Always bring newest bottle. Bring bottle, even if empty. 3. Medication refills: You are responsible for knowing and keeping track of what medications you need refilled. The day before your appointment, write a list of all prescriptions that need to be refilled. Bring that list to your appointment and give it to the admitting nurse. Prescriptions will be written only during appointments. If you forget a medication, it will not be "Called in", "Faxed", or "electronically sent". You will need to get another appointment to get these prescribed. 4. Prescription Accuracy: You are responsible for carefully inspecting your prescriptions before leaving our office. Have the discharge nurse carefully go over each prescription with you, before taking them home. Make sure that your name is accurately spelled, that your address is correct. Check the name and dose of your medication to make sure it is accurate. Check the number of pills, and the written instructions to make sure they are clear and accurate. Make sure that you are given enough medication to  last until your next medication refill appointment. 5. Taking Medication: Take medication as prescribed. Never take more pills than instructed. Never take medication more frequently than prescribed. Taking less pills or less frequently is permitted and encouraged, when it comes to controlled substances (written prescriptions).  6. Inform other Doctors: Always inform, all of your healthcare providers, of all the medications you take. 7. Pain Medication from other Providers: You are not allowed to accept any additional pain medication from any other Doctor or Healthcare provider. There are two exceptions to this rule. (see below) In the event that you require additional pain medication, you are responsible for notifying us, as stated below. 8. Medication Agreement: You are responsible for carefully reading and following our Medication Agreement. This must be signed before receiving any prescriptions from our practice. Safely store a copy of your signed Agreement. Violations to the Agreement will result in no further prescriptions. (Additional copies of our Medication Agreement are available upon request.) 9. Laws, Rules, & Regulations: All patients are expected to follow all Federal and State Laws, Statutes, Rules, & Regulations. Ignorance of the Laws does not constitute a valid excuse. The use of any illegal substances is prohibited. 10. Adopted CDC guidelines & recommendations: Target dosing levels will be at or below 60 MME/day. Use of benzodiazepines** is not recommended.  Exceptions: There are only two exceptions to the rule of not receiving pain medications from other Healthcare Providers. 1. Exception #1 (Emergencies): In the event of an emergency (i.e.: accident requiring emergency care), you are allowed to receive additional pain medication. However, you are responsible for: As soon as you are able, call our office [phone], at any time of the day or night, and leave a message stating your  name, the date and nature of the emergency, and the name and dose of the medication   prescribed. In the event that your call is answered by a member of our staff, make sure to document and save the date, time, and the name of the person that took your information.  2. Exception #2 (Planned Surgery): In the event that you are scheduled by another doctor or dentist to have any type of surgery or procedure, you are allowed (for a period no longer than 30 days), to receive additional pain medication, for the acute post-op pain. However, in this case, you are responsible for picking up a copy of our "Post-op Pain Management for Surgeons" handout, and giving it to your surgeon or dentist. This document is available at our office, and does not require an appointment to obtain it. Simply go to our office during business hours (Monday-Thursday from 8:00 AM to 4:00 PM) (Friday 8:00 AM to 12:00 Noon) or if you have a scheduled appointment with [place], prior to your surgery, and ask for it by name. In addition, you will need to provide [place] with your name, name of your surgeon, type of surgery, and date of procedure or surgery.  *Opioid medications include: morphine, codeine, oxycodone, oxymorphone, hydrocodone, hydromorphone, meperidine, tramadol, tapentadol, buprenorphine, fentanyl, methadone. **Benzodiazepine medications include: diazepam (Valium), alprazolam (Xanax), clonazepam (Klonopine), lorazepam (Ativan), clorazepate (Tranxene), chlordiazepoxide (Librium), estazolam (Prosom), oxazepam (Serax), temazepam (Restoril), triazolam (Halcion)  ____________________________________________________________________________________________   BMI Assessment: Estimated body mass index is 41.35 kg/m as calculated from the following:   Height as of this encounter: 5\' 7"  (1.702 m).   Weight as of this encounter: 264 lb (119.7 kg).  BMI interpretation table: BMI level Category Range association with higher incidence of chronic  pain  <18 kg/m2 Underweight   18.5-24.9 kg/m2 Ideal body weight   25-29.9 kg/m2 Overweight Increased incidence by 20%  30-34.9 kg/m2 Obese (Class I) Increased incidence by 68%  35-39.9 kg/m2 Severe obesity (Class II) Increased incidence by 136%  >40 kg/m2 Extreme obesity (Class III) Increased incidence by 254%   BMI Readings from Last 4 Encounters:  06/26/17 41.35 kg/m  05/08/17 42.13 kg/m  04/24/17 42.23 kg/m  03/26/17 42.13 kg/m   Wt Readings from Last 4 Encounters:  06/26/17 264 lb (119.7 kg)  05/08/17 269 lb (122 kg)  04/24/17 269 lb 9.6 oz (122.3 kg)  03/26/17 269 lb (122 kg)  Prescriptions for Baclofen, Flexeril, and Gabapentin were sent to your pharmacy. You were given 3 prescriptions for Oxycodone.

## 2017-08-01 ENCOUNTER — Ambulatory Visit (HOSPITAL_COMMUNITY): Payer: Medicare Other | Admitting: Psychiatry

## 2017-09-01 ENCOUNTER — Ambulatory Visit (HOSPITAL_COMMUNITY): Payer: Medicare Other | Admitting: Psychiatry

## 2017-09-03 ENCOUNTER — Ambulatory Visit: Payer: Medicare Other | Admitting: Podiatry

## 2017-09-10 ENCOUNTER — Ambulatory Visit: Payer: Medicare Other | Admitting: Podiatry

## 2017-09-15 ENCOUNTER — Encounter: Payer: Medicare Other | Admitting: Nurse Practitioner

## 2017-09-27 DIAGNOSIS — A059 Bacterial foodborne intoxication, unspecified: Secondary | ICD-10-CM

## 2017-09-27 HISTORY — DX: Bacterial foodborne intoxication, unspecified: A05.9

## 2017-09-30 ENCOUNTER — Encounter: Payer: Self-pay | Admitting: Nurse Practitioner

## 2017-09-30 ENCOUNTER — Other Ambulatory Visit: Payer: Self-pay

## 2017-09-30 ENCOUNTER — Ambulatory Visit: Payer: Medicare Other | Attending: Nurse Practitioner | Admitting: Nurse Practitioner

## 2017-09-30 VITALS — BP 127/73 | HR 79 | Temp 98.3°F | Resp 16 | Ht 67.0 in | Wt 250.0 lb

## 2017-09-30 DIAGNOSIS — M545 Low back pain: Secondary | ICD-10-CM | POA: Diagnosis not present

## 2017-09-30 DIAGNOSIS — Z79891 Long term (current) use of opiate analgesic: Secondary | ICD-10-CM | POA: Insufficient documentation

## 2017-09-30 DIAGNOSIS — G894 Chronic pain syndrome: Secondary | ICD-10-CM | POA: Diagnosis not present

## 2017-09-30 DIAGNOSIS — R252 Cramp and spasm: Secondary | ICD-10-CM | POA: Diagnosis not present

## 2017-09-30 DIAGNOSIS — Z87820 Personal history of traumatic brain injury: Secondary | ICD-10-CM | POA: Diagnosis not present

## 2017-09-30 DIAGNOSIS — K449 Diaphragmatic hernia without obstruction or gangrene: Secondary | ICD-10-CM | POA: Diagnosis not present

## 2017-09-30 DIAGNOSIS — M797 Fibromyalgia: Secondary | ICD-10-CM | POA: Diagnosis not present

## 2017-09-30 DIAGNOSIS — M7918 Myalgia, other site: Secondary | ICD-10-CM

## 2017-09-30 DIAGNOSIS — M542 Cervicalgia: Secondary | ICD-10-CM | POA: Diagnosis not present

## 2017-09-30 DIAGNOSIS — I10 Essential (primary) hypertension: Secondary | ICD-10-CM | POA: Diagnosis not present

## 2017-09-30 DIAGNOSIS — G4733 Obstructive sleep apnea (adult) (pediatric): Secondary | ICD-10-CM | POA: Insufficient documentation

## 2017-09-30 DIAGNOSIS — Z6841 Body Mass Index (BMI) 40.0 and over, adult: Secondary | ICD-10-CM | POA: Insufficient documentation

## 2017-09-30 DIAGNOSIS — Z5181 Encounter for therapeutic drug level monitoring: Secondary | ICD-10-CM | POA: Diagnosis present

## 2017-09-30 DIAGNOSIS — R079 Chest pain, unspecified: Secondary | ICD-10-CM | POA: Diagnosis not present

## 2017-09-30 DIAGNOSIS — M47816 Spondylosis without myelopathy or radiculopathy, lumbar region: Secondary | ICD-10-CM | POA: Diagnosis not present

## 2017-09-30 DIAGNOSIS — Z87891 Personal history of nicotine dependence: Secondary | ICD-10-CM | POA: Insufficient documentation

## 2017-09-30 DIAGNOSIS — M25511 Pain in right shoulder: Secondary | ICD-10-CM | POA: Insufficient documentation

## 2017-09-30 DIAGNOSIS — M1612 Unilateral primary osteoarthritis, left hip: Secondary | ICD-10-CM | POA: Insufficient documentation

## 2017-09-30 DIAGNOSIS — M47812 Spondylosis without myelopathy or radiculopathy, cervical region: Secondary | ICD-10-CM | POA: Diagnosis not present

## 2017-09-30 DIAGNOSIS — M79641 Pain in right hand: Secondary | ICD-10-CM | POA: Diagnosis not present

## 2017-09-30 DIAGNOSIS — M25512 Pain in left shoulder: Secondary | ICD-10-CM | POA: Insufficient documentation

## 2017-09-30 DIAGNOSIS — R600 Localized edema: Secondary | ICD-10-CM | POA: Insufficient documentation

## 2017-09-30 DIAGNOSIS — Z79899 Other long term (current) drug therapy: Secondary | ICD-10-CM | POA: Diagnosis not present

## 2017-09-30 DIAGNOSIS — M792 Neuralgia and neuritis, unspecified: Secondary | ICD-10-CM

## 2017-09-30 DIAGNOSIS — E7800 Pure hypercholesterolemia, unspecified: Secondary | ICD-10-CM | POA: Insufficient documentation

## 2017-09-30 DIAGNOSIS — M79602 Pain in left arm: Secondary | ICD-10-CM | POA: Insufficient documentation

## 2017-09-30 MED ORDER — OXYCODONE HCL 5 MG PO TABS: 5.0000 mg | ORAL_TABLET | Freq: Four times a day (QID) | ORAL | 0 refills | Status: DC | PRN

## 2017-09-30 MED ORDER — GABAPENTIN 300 MG PO CAPS: 300.0000 mg | ORAL_CAPSULE | Freq: Three times a day (TID) | ORAL | 2 refills | Status: DC

## 2017-09-30 MED ORDER — BACLOFEN 10 MG PO TABS: 10.0000 mg | ORAL_TABLET | Freq: Every evening | ORAL | 2 refills | Status: DC | PRN

## 2017-09-30 MED ORDER — CYCLOBENZAPRINE HCL 10 MG PO TABS: 10.0000 mg | ORAL_TABLET | Freq: Three times a day (TID) | ORAL | 2 refills | Status: DC | PRN

## 2017-09-30 NOTE — Patient Instructions (Addendum)
Rx for flexeril, gabapentin and baclofen have been escribed to your pharmacy.  You have been given 3 Rx for Oxycodone to last until 7/22/2019____________________________________________________________________________________________  Medication Rules  Applies to: All patients receiving prescriptions (written or electronic).  Pharmacy of record: Pharmacy where electronic prescriptions will be sent. If written prescriptions are taken to a different pharmacy, please inform the nursing staff. The pharmacy listed in the electronic medical record should be the one where you would like electronic prescriptions to be sent.  Prescription refills: Only during scheduled appointments. Applies to both, written and electronic prescriptions.  NOTE: The following applies primarily to controlled substances (Opioid* Pain Medications).   Patient's responsibilities: 1. Pain Pills: Bring all pain pills to every appointment (except for procedure appointments). 2. Pill Bottles: Bring pills in original pharmacy bottle. Always bring newest bottle. Bring bottle, even if empty. 3. Medication refills: You are responsible for knowing and keeping track of what medications you need refilled. The day before your appointment, write a list of all prescriptions that need to be refilled. Bring that list to your appointment and give it to the admitting nurse. Prescriptions will be written only during appointments. If you forget a medication, it will not be "Called in", "Faxed", or "electronically sent". You will need to get another appointment to get these prescribed. 4. Prescription Accuracy: You are responsible for carefully inspecting your prescriptions before leaving our office. Have the discharge nurse carefully go over each prescription with you, before taking them home. Make sure that your name is accurately spelled, that your address is correct. Check the name and dose of your medication to make sure it is accurate. Check the  number of pills, and the written instructions to make sure they are clear and accurate. Make sure that you are given enough medication to last until your next medication refill appointment. 5. Taking Medication: Take medication as prescribed. Never take more pills than instructed. Never take medication more frequently than prescribed. Taking less pills or less frequently is permitted and encouraged, when it comes to controlled substances (written prescriptions).  6. Inform other Doctors: Always inform, all of your healthcare providers, of all the medications you take. 7. Pain Medication from other Providers: You are not allowed to accept any additional pain medication from any other Doctor or Healthcare provider. There are two exceptions to this rule. (see below) In the event that you require additional pain medication, you are responsible for notifying us, as stated below. 8. Medication Agreement: You are responsible for carefully reading and following our Medication Agreement. This must be signed before receiving any prescriptions from our practice. Safely store a copy of your signed Agreement. Violations to the Agreement will result in no further prescriptions. (Additional copies of our Medication Agreement are available upon request.) 9. Laws, Rules, & Regulations: All patients are expected to follow all Federal and [employer], [employer], Rules, [employer]. Ignorance of the Laws does not constitute a valid excuse. The use of any illegal substances is prohibited. 10. Adopted CDC guidelines & recommendations: Target dosing levels will be at or below 60 MME/day. Use of benzodiazepines** is not recommended.  Exceptions: There are only two exceptions to the rule of not receiving pain medications from other Healthcare Providers. 1. Exception #1 (Emergencies): In the event of an emergency (i.e.: accident requiring emergency care), you are allowed to receive additional pain medication. However, you are  responsible for: As soon as you are able, call our office [phone], at any time of the day  or night, and leave a message stating your name, the date and nature of the emergency, and the name and dose of the medication prescribed. In the event that your call is answered by a member of our staff, make sure to document and save the date, time, and the name of the person that took your information.  2. Exception #2 (Planned Surgery): In the event that you are scheduled by another doctor or dentist to have any type of surgery or procedure, you are allowed (for a period no longer than 30 days), to receive additional pain medication, for the acute post-op pain. However, in this case, you are responsible for picking up a copy of our "Post-op Pain Management for Surgeons" handout, and giving it to your surgeon or dentist. This document is available at our office, and does not require an appointment to obtain it. Simply go to our office during business hours (Monday-Thursday from 8:00 AM to 4:00 PM) (Friday 8:00 AM to 12:00 Noon) or if you have a scheduled appointment with [place], prior to your surgery, and ask for it by name. In addition, you will need to provide [place] with your name, name of your surgeon, type of surgery, and date of procedure or surgery.  *Opioid medications include: morphine, codeine, oxycodone, oxymorphone, hydrocodone, hydromorphone, meperidine, tramadol, tapentadol, buprenorphine, fentanyl, methadone. **Benzodiazepine medications include: diazepam (Valium), alprazolam (Xanax), clonazepam (Klonopine), lorazepam (Ativan), clorazepate (Tranxene), chlordiazepoxide (Librium), estazolam (Prosom), oxazepam (Serax), temazepam (Restoril), triazolam (Halcion) (Last updated: 08/07/2017) ____________________________________________________________________________________________    BMI Assessment: Estimated body mass index is 39.16 kg/m as calculated from the following:   Height as of this encounter: 5'  7" (1.702 m).   Weight as of this encounter: 250 lb (113.4 kg).  BMI interpretation table: BMI level Category Range association with higher incidence of chronic pain  <18 kg/m2 Underweight   18.5-24.9 kg/m2 Ideal body weight   25-29.9 kg/m2 Overweight Increased incidence by 20%  30-34.9 kg/m2 Obese (Class I) Increased incidence by 68%  35-39.9 kg/m2 Severe obesity (Class II) Increased incidence by 136%  >40 kg/m2 Extreme obesity (Class III) Increased incidence by 254%   BMI Readings from Last 4 Encounters:  09/30/17 39.16 kg/m  06/26/17 41.35 kg/m  05/08/17 42.13 kg/m  04/24/17 42.23 kg/m   Wt Readings from Last 4 Encounters:  09/30/17 250 lb (113.4 kg)  06/26/17 264 lb (119.7 kg)  05/08/17 269 lb (122 kg)  04/24/17 269 lb 9.6 oz (122.3 kg)

## 2017-09-30 NOTE — Progress Notes (Signed)
Nursing Pain Medication Assessment:  Safety precautions to be maintained throughout the outpatient stay will include: orient to surroundings, keep bed in low position, maintain call bell within reach at all times, provide assistance with transfer out of bed and ambulation.  Medication Inspection Compliance: Pill count conducted under aseptic conditions, in front of the patient. Neither the pills nor the bottle was removed from the patient's sight at any time. Once count was completed pills were immediately returned to the patient in their original bottle.  Medication: Oxycodone IR Pill/Patch Count: 0 of 120 pills remain Pill/Patch Appearance: Markings consistent with prescribed medication Bottle Appearance: Standard pharmacy container. Clearly labeled. Filled Date: 3 / 52 / 2019 Last Medication intake:  Today

## 2017-09-30 NOTE — Progress Notes (Signed)
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 inter-individual differences that may also be affected by time variables.  Patient instructions provided during this appointment: Patient Instructions   Rx for flexeril, gabapentin and baclofen have been escribed to your pharmacy.  You have been given 3 Rx for Oxycodone to last until 7/22/2019____________________________________________________________________________________________  Medication Rules  Applies to: All patients receiving prescriptions (written or electronic).  Pharmacy of record: Pharmacy where electronic prescriptions will be sent. If written prescriptions are taken to a different pharmacy, please inform the nursing staff. The pharmacy listed in the electronic medical record should be the one where you would like electronic prescriptions to be sent.  Prescription refills: Only during scheduled appointments. Applies to both, written and electronic  prescriptions.  NOTE: The following applies primarily to controlled substances (Opioid* Pain Medications).   Patient's responsibilities: 1. Pain Pills: Bring all pain pills to every appointment (except for procedure appointments). 2. Pill Bottles: Bring pills in original pharmacy bottle. Always bring newest bottle. Bring bottle, even if empty. 3. Medication refills: You are responsible for knowing and keeping track of what medications you need refilled. The day before your appointment, write a list of all prescriptions that need to be refilled. Bring that list to your appointment and give it to the admitting nurse. Prescriptions will be written only during appointments. If you forget a medication, it will not be "Called in", "Faxed", or "electronically sent". You will need to get another appointment to get these prescribed. 4. Prescription Accuracy: You are responsible for carefully inspecting your prescriptions before leaving our office. Have the discharge nurse carefully go over each prescription with you, before taking them home. Make sure that your name is accurately spelled, that your address is correct. Check the name and dose of your medication to make sure it is accurate. Check the number of pills, and the written instructions to make sure they are clear and accurate. Make sure that you are given enough medication to last until your next medication refill appointment. 5. Taking Medication: Take medication as prescribed. Never take more pills than instructed. Never take medication more frequently than prescribed. Taking less pills or less frequently is permitted and encouraged, when it comes to controlled substances (written prescriptions).  6. Inform other Doctors: Always inform, all of your healthcare providers, of all the medications you take. 7. Pain Medication from other Providers: You are not allowed to accept any additional pain medication from any other Doctor or Healthcare provider. There  are two exceptions to this rule. (see below) In the event that you require additional pain medication, you are responsible for notifying us, as stated below. 8. Medication Agreement: You are responsible for carefully reading and following our Medication Agreement. This must be signed before receiving any prescriptions from our practice. Safely store a copy of your signed Agreement. Violations to the Agreement will result in no further prescriptions. (Additional copies of our Medication Agreement are available upon request.) 9. Laws, Rules, & Regulations: All patients are expected to follow all Federal and [employer], [employer], Rules, [employer]. Ignorance of the Laws does not constitute a valid excuse. The use of any illegal substances is prohibited. 10. Adopted CDC guidelines & recommendations: Target dosing levels will be at or below 60 MME/day. Use of benzodiazepines** is not recommended.  Exceptions: There are only two exceptions to the rule of not receiving pain medications  from other Healthcare Providers. 1. Exception #1 (Emergencies): In the event of an emergency (i.e.: accident requiring emergency care), you are allowed to receive additional pain medication. However, you are responsible for: As soon as you are able, call our office [phone], at any time of the day or night, and leave a message stating your name, the date and nature of the emergency, and the name and dose of the medication prescribed. In the event that your call is answered by a member of our staff, make sure to document and save the date, time, and the name of the person that took your information.  2. Exception #2 (Planned Surgery): In the event that you are scheduled by another doctor or dentist to have any type of surgery or procedure, you are allowed (for a period no longer than 30 days), to receive additional pain medication, for the acute post-op pain. However, in this case, you are responsible for picking up a copy of  our "Post-op Pain Management for Surgeons" handout, and giving it to your surgeon or dentist. This document is available at our office, and does not require an appointment to obtain it. Simply go to our office during business hours (Monday-Thursday from 8:00 AM to 4:00 PM) (Friday 8:00 AM to 12:00 Noon) or if you have a scheduled appointment with [place], prior to your surgery, and ask for it by name. In addition, you will need to provide [place] with your name, name of your surgeon, type of surgery, and date of procedure or surgery.  *Opioid medications include: morphine, codeine, oxycodone, oxymorphone, hydrocodone, hydromorphone, meperidine, tramadol, tapentadol, buprenorphine, fentanyl, methadone. **Benzodiazepine medications include: diazepam (Valium), alprazolam (Xanax), clonazepam (Klonopine), lorazepam (Ativan), clorazepate (Tranxene), chlordiazepoxide (Librium), estazolam (Prosom), oxazepam (Serax), temazepam (Restoril), triazolam (Halcion) (Last updated: 08/07/2017) ____________________________________________________________________________________________    BMI Assessment: Estimated body mass index is 39.16 kg/m as calculated from the following:   Height as of this encounter: '5\' 7"'  (1.702 m).   Weight as of this encounter: 250 lb (113.4 kg).  BMI interpretation table: BMI level Category Range association with higher incidence of chronic pain  <18 kg/m2 Underweight   18.5-24.9 kg/m2 Ideal body weight   25-29.9 kg/m2 Overweight Increased incidence by 20%  30-34.9 kg/m2 Obese (Class I) Increased incidence by 68%  35-39.9 kg/m2 Severe obesity (Class II) Increased incidence by 136%  >40 kg/m2 Extreme obesity (Class III) Increased incidence by 254%   BMI Readings from Last 4 Encounters:  09/30/17 39.16 kg/m  06/26/17 41.35 kg/m  05/08/17 42.13 kg/m  04/24/17 42.23 kg/m   Wt Readings from Last 4 Encounters:  09/30/17 250 lb (113.4 kg)  06/26/17 264 lb (119.7 kg)  05/08/17 269 lb (122  kg)  04/24/17 269 lb 9.6 oz (122.3 kg)

## 2017-10-07 ENCOUNTER — Ambulatory Visit (HOSPITAL_COMMUNITY): Payer: Medicare Other | Admitting: Psychiatry

## 2017-10-07 ENCOUNTER — Encounter

## 2017-10-15 ENCOUNTER — Encounter

## 2017-10-15 ENCOUNTER — Ambulatory Visit (INDEPENDENT_AMBULATORY_CARE_PROVIDER_SITE_OTHER): Payer: Medicare Other | Admitting: Podiatry

## 2017-10-15 ENCOUNTER — Encounter: Payer: Self-pay | Admitting: Podiatry

## 2017-10-15 ENCOUNTER — Ambulatory Visit (INDEPENDENT_AMBULATORY_CARE_PROVIDER_SITE_OTHER): Payer: Medicare Other

## 2017-10-15 DIAGNOSIS — M722 Plantar fascial fibromatosis: Secondary | ICD-10-CM

## 2017-10-15 NOTE — Progress Notes (Signed)
She presents today states that I think my spurs are bothering me again they have been bothering her for the past 3 to 4 months.  She states that she thinks she needs another set of injections.  Objective: Vital signs are stable she is alert and oriented x3 she has pain to palpation medial calcaneal tubercles pulses remain palpable.  Assessment: Plantar fasciitis bilateral.  Plan: Discussed etiology pathology conservative versus surgical therapies.  At this point I reinjected bilateral heels after sterile Betadine skin prep 20 mg of Kenalog 5 mg Marcaine point of maximal tenderness medial aspect of bilateral heels tolerated procedure well without complications.  Follow-up with me in 1 month.  She will also continue her meloxicam which she already is taking for her elbow.

## 2017-12-16 ENCOUNTER — Ambulatory Visit: Payer: Medicare Other | Attending: Nurse Practitioner | Admitting: Nurse Practitioner

## 2017-12-16 ENCOUNTER — Other Ambulatory Visit: Payer: Self-pay

## 2017-12-16 ENCOUNTER — Encounter: Payer: Self-pay | Admitting: Nurse Practitioner

## 2017-12-16 VITALS — BP 122/66 | HR 71 | Temp 98.3°F | Ht 67.0 in | Wt 240.0 lb

## 2017-12-16 DIAGNOSIS — R252 Cramp and spasm: Secondary | ICD-10-CM

## 2017-12-16 DIAGNOSIS — M792 Neuralgia and neuritis, unspecified: Secondary | ICD-10-CM

## 2017-12-16 DIAGNOSIS — Z888 Allergy status to other drugs, medicaments and biological substances status: Secondary | ICD-10-CM | POA: Insufficient documentation

## 2017-12-16 DIAGNOSIS — H547 Unspecified visual loss: Secondary | ICD-10-CM | POA: Insufficient documentation

## 2017-12-16 DIAGNOSIS — Z79891 Long term (current) use of opiate analgesic: Secondary | ICD-10-CM | POA: Diagnosis not present

## 2017-12-16 DIAGNOSIS — M1712 Unilateral primary osteoarthritis, left knee: Secondary | ICD-10-CM | POA: Diagnosis not present

## 2017-12-16 DIAGNOSIS — E785 Hyperlipidemia, unspecified: Secondary | ICD-10-CM | POA: Insufficient documentation

## 2017-12-16 DIAGNOSIS — Z87891 Personal history of nicotine dependence: Secondary | ICD-10-CM | POA: Insufficient documentation

## 2017-12-16 DIAGNOSIS — M1610 Unilateral primary osteoarthritis, unspecified hip: Secondary | ICD-10-CM | POA: Insufficient documentation

## 2017-12-16 DIAGNOSIS — M810 Age-related osteoporosis without current pathological fracture: Secondary | ICD-10-CM | POA: Insufficient documentation

## 2017-12-16 DIAGNOSIS — G894 Chronic pain syndrome: Secondary | ICD-10-CM

## 2017-12-16 DIAGNOSIS — M47816 Spondylosis without myelopathy or radiculopathy, lumbar region: Secondary | ICD-10-CM

## 2017-12-16 DIAGNOSIS — Z8249 Family history of ischemic heart disease and other diseases of the circulatory system: Secondary | ICD-10-CM | POA: Insufficient documentation

## 2017-12-16 DIAGNOSIS — M7918 Myalgia, other site: Secondary | ICD-10-CM | POA: Diagnosis not present

## 2017-12-16 DIAGNOSIS — G4733 Obstructive sleep apnea (adult) (pediatric): Secondary | ICD-10-CM | POA: Insufficient documentation

## 2017-12-16 DIAGNOSIS — R32 Unspecified urinary incontinence: Secondary | ICD-10-CM | POA: Insufficient documentation

## 2017-12-16 DIAGNOSIS — K219 Gastro-esophageal reflux disease without esophagitis: Secondary | ICD-10-CM | POA: Insufficient documentation

## 2017-12-16 DIAGNOSIS — M545 Low back pain: Secondary | ICD-10-CM | POA: Diagnosis present

## 2017-12-16 DIAGNOSIS — J45909 Unspecified asthma, uncomplicated: Secondary | ICD-10-CM | POA: Diagnosis not present

## 2017-12-16 DIAGNOSIS — Z841 Family history of disorders of kidney and ureter: Secondary | ICD-10-CM | POA: Insufficient documentation

## 2017-12-16 DIAGNOSIS — Z6837 Body mass index (BMI) 37.0-37.9, adult: Secondary | ICD-10-CM | POA: Insufficient documentation

## 2017-12-16 DIAGNOSIS — F411 Generalized anxiety disorder: Secondary | ICD-10-CM | POA: Diagnosis not present

## 2017-12-16 DIAGNOSIS — F329 Major depressive disorder, single episode, unspecified: Secondary | ICD-10-CM | POA: Insufficient documentation

## 2017-12-16 DIAGNOSIS — Z87820 Personal history of traumatic brain injury: Secondary | ICD-10-CM | POA: Diagnosis not present

## 2017-12-16 DIAGNOSIS — Z79899 Other long term (current) drug therapy: Secondary | ICD-10-CM | POA: Diagnosis not present

## 2017-12-16 DIAGNOSIS — M47812 Spondylosis without myelopathy or radiculopathy, cervical region: Secondary | ICD-10-CM | POA: Diagnosis not present

## 2017-12-16 DIAGNOSIS — E039 Hypothyroidism, unspecified: Secondary | ICD-10-CM | POA: Insufficient documentation

## 2017-12-16 DIAGNOSIS — Z791 Long term (current) use of non-steroidal anti-inflammatories (NSAID): Secondary | ICD-10-CM | POA: Diagnosis not present

## 2017-12-16 DIAGNOSIS — Z5181 Encounter for therapeutic drug level monitoring: Secondary | ICD-10-CM | POA: Insufficient documentation

## 2017-12-16 DIAGNOSIS — M533 Sacrococcygeal disorders, not elsewhere classified: Secondary | ICD-10-CM | POA: Diagnosis not present

## 2017-12-16 DIAGNOSIS — M797 Fibromyalgia: Secondary | ICD-10-CM | POA: Diagnosis not present

## 2017-12-16 DIAGNOSIS — Z882 Allergy status to sulfonamides status: Secondary | ICD-10-CM | POA: Insufficient documentation

## 2017-12-16 DIAGNOSIS — E559 Vitamin D deficiency, unspecified: Secondary | ICD-10-CM | POA: Insufficient documentation

## 2017-12-16 DIAGNOSIS — Z833 Family history of diabetes mellitus: Secondary | ICD-10-CM | POA: Insufficient documentation

## 2017-12-16 DIAGNOSIS — H9190 Unspecified hearing loss, unspecified ear: Secondary | ICD-10-CM | POA: Insufficient documentation

## 2017-12-16 DIAGNOSIS — Z8711 Personal history of peptic ulcer disease: Secondary | ICD-10-CM | POA: Insufficient documentation

## 2017-12-16 DIAGNOSIS — G8929 Other chronic pain: Secondary | ICD-10-CM

## 2017-12-16 DIAGNOSIS — M4726 Other spondylosis with radiculopathy, lumbar region: Secondary | ICD-10-CM | POA: Diagnosis not present

## 2017-12-16 DIAGNOSIS — Z91041 Radiographic dye allergy status: Secondary | ICD-10-CM | POA: Insufficient documentation

## 2017-12-16 DIAGNOSIS — I10 Essential (primary) hypertension: Secondary | ICD-10-CM | POA: Insufficient documentation

## 2017-12-16 DIAGNOSIS — G4700 Insomnia, unspecified: Secondary | ICD-10-CM | POA: Insufficient documentation

## 2017-12-16 DIAGNOSIS — Z825 Family history of asthma and other chronic lower respiratory diseases: Secondary | ICD-10-CM | POA: Insufficient documentation

## 2017-12-16 MED ORDER — CYCLOBENZAPRINE HCL 10 MG PO TABS: 10.0000 mg | ORAL_TABLET | Freq: Three times a day (TID) | ORAL | 2 refills | Status: DC | PRN

## 2017-12-16 MED ORDER — OXYCODONE HCL 5 MG PO TABS: 5.0000 mg | ORAL_TABLET | Freq: Four times a day (QID) | ORAL | 0 refills | Status: DC | PRN

## 2017-12-16 MED ORDER — TRAZODONE HCL 100 MG PO TABS: 100.0000 mg | ORAL_TABLET | Freq: Every day | ORAL | 2 refills | Status: DC

## 2017-12-16 MED ORDER — GABAPENTIN 300 MG PO CAPS: 300.0000 mg | ORAL_CAPSULE | Freq: Three times a day (TID) | ORAL | 2 refills | Status: DC

## 2017-12-16 MED ORDER — BACLOFEN 10 MG PO TABS: 10.0000 mg | ORAL_TABLET | Freq: Every evening | ORAL | 2 refills | Status: DC | PRN

## 2017-12-16 NOTE — Patient Instructions (Addendum)
____________________________________________________________________________________________  Medication Rules  Applies to: All patients receiving prescriptions (written or electronic).  Pharmacy of record: Pharmacy where electronic prescriptions will be sent. If written prescriptions are taken to a different pharmacy, please inform the nursing staff. The pharmacy listed in the electronic medical record should be the one where you would like electronic prescriptions to be sent.  Prescription refills: Only during scheduled appointments. Applies to both, written and electronic prescriptions.  NOTE: The following applies primarily to controlled substances (Opioid* Pain Medications).   Patient's responsibilities: 1. Pain Pills: Bring all pain pills to every appointment (except for procedure appointments). 2. Pill Bottles: Bring pills in original pharmacy bottle. Always bring newest bottle. Bring bottle, even if empty. 3. Medication refills: You are responsible for knowing and keeping track of what medications you need refilled. The day before your appointment, write a list of all prescriptions that need to be refilled. Bring that list to your appointment and give it to the admitting nurse. Prescriptions will be written only during appointments. If you forget a medication, it will not be "Called in", "Faxed", or "electronically sent". You will need to get another appointment to get these prescribed. 4. Prescription Accuracy: You are responsible for carefully inspecting your prescriptions before leaving our office. Have the discharge nurse carefully go over each prescription with you, before taking them home. Make sure that your name is accurately spelled, that your address is correct. Check the name and dose of your medication to make sure it is accurate. Check the number of pills, and the written instructions to make sure they are clear and accurate. Make sure that you are given enough medication to last  until your next medication refill appointment. 5. Taking Medication: Take medication as prescribed. Never take more pills than instructed. Never take medication more frequently than prescribed. Taking less pills or less frequently is permitted and encouraged, when it comes to controlled substances (written prescriptions).  6. Inform other Doctors: Always inform, all of your healthcare providers, of all the medications you take. 7. Pain Medication from other Providers: You are not allowed to accept any additional pain medication from any other Doctor or Healthcare provider. There are two exceptions to this rule. (see below) In the event that you require additional pain medication, you are responsible for notifying us, as stated below. 8. Medication Agreement: You are responsible for carefully reading and following our Medication Agreement. This must be signed before receiving any prescriptions from our practice. Safely store a copy of your signed Agreement. Violations to the Agreement will result in no further prescriptions. (Additional copies of our Medication Agreement are available upon request.) 9. Laws, Rules, & Regulations: All patients are expected to follow all Federal and State Laws, Statutes, Rules, & Regulations. Ignorance of the Laws does not constitute a valid excuse. The use of any illegal substances is prohibited. 10. Adopted CDC guidelines & recommendations: Target dosing levels will be at or below 60 MME/day. Use of benzodiazepines** is not recommended.  Exceptions: There are only two exceptions to the rule of not receiving pain medications from other Healthcare Providers. 1. Exception #1 (Emergencies): In the event of an emergency (i.e.: accident requiring emergency care), you are allowed to receive additional pain medication. However, you are responsible for: As soon as you are able, call our office [phone], at any time of the day or night, and leave a message stating your name, the  date and nature of the emergency, and the name and dose of the medication   prescribed. In the event that your call is answered by a member of our staff, make sure to document and save the date, time, and the name of the person that took your information.  2. Exception #2 (Planned Surgery): In the event that you are scheduled by another doctor or dentist to have any type of surgery or procedure, you are allowed (for a period no longer than 30 days), to receive additional pain medication, for the acute post-op pain. However, in this case, you are responsible for picking up a copy of our "Post-op Pain Management for Surgeons" handout, and giving it to your surgeon or dentist. This document is available at our office, and does not require an appointment to obtain it. Simply go to our office during business hours (Monday-Thursday from 8:00 AM to 4:00 PM) (Friday 8:00 AM to 12:00 Noon) or if you have a scheduled appointment with [place], prior to your surgery, and ask for it by name. In addition, you will need to provide [place] with your name, name of your surgeon, type of surgery, and date of procedure or surgery.  *Opioid medications include: morphine, codeine, oxycodone, oxymorphone, hydrocodone, hydromorphone, meperidine, tramadol, tapentadol, buprenorphine, fentanyl, methadone. **Benzodiazepine medications include: diazepam (Valium), alprazolam (Xanax), clonazepam (Klonopine), lorazepam (Ativan), clorazepate (Tranxene), chlordiazepoxide (Librium), estazolam (Prosom), oxazepam (Serax), temazepam (Restoril), triazolam (Halcion) (Last updated: 08/07/2017) ____________________________________________________________________________________________ Kristina Gomez were given 3 prescriptions for Oxycodone. Prescriptions for Baclofen, Gabapentin, Flexeril, and Trazadone were sent to your pharmacy.

## 2017-12-16 NOTE — Progress Notes (Signed)
Nursing Pain Medication Assessment:  Safety precautions to be maintained throughout the outpatient stay will include: orient to surroundings, keep bed in low position, maintain call bell within reach at all times, provide assistance with transfer out of bed and ambulation.  Medication Inspection Compliance: Pill count conducted under aseptic conditions, in front of the patient. Neither the pills nor the bottle was removed from the patient's sight at any time. Once count was completed pills were immediately returned to the patient in their original bottle.  Medication: Oxycodone IR Pill/Patch Count: 88 of 120 pills remain Pill/Patch Appearance: Markings consistent with prescribed medication Bottle Appearance: Standard pharmacy container. Clearly labeled. Filled Date: 6 / 24 / 2019 Last Medication intake:  Today

## 2017-12-16 NOTE — Progress Notes (Signed)
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 Pharmacy where electronic prescriptions will be sent. If written prescriptions are taken to a different pharmacy, please inform the nursing staff. The pharmacy listed in the electronic medical record should be the one where you would like electronic prescriptions to be sent.  Prescription refills: Only during scheduled appointments. Applies to both, written and electronic prescriptions.  NOTE: The following applies primarily to controlled substances (Opioid* Pain Medications).   Patient's responsibilities: 1. Pain Pills: Bring all pain pills to every appointment (except for procedure appointments). 2. Pill Bottles: Bring pills in original pharmacy bottle. Always bring newest bottle. Bring bottle, even if empty. 3. Medication refills: You are responsible for knowing and keeping track of what medications you need refilled. The day before your appointment, write a list of all prescriptions that need to be  refilled. Bring that list to your appointment and give it to the admitting nurse. Prescriptions will be written only during appointments. If you forget a medication, it will not be "Called in", "Faxed", or "electronically sent". You will need to get another appointment to get these prescribed. 4. Prescription Accuracy: You are responsible for carefully inspecting your prescriptions before leaving our office. Have the discharge nurse carefully go over each prescription with you, before taking them home. Make sure that your name is accurately spelled, that your address is correct. Check the name and dose of your medication to make sure it is accurate. Check the number of pills, and the written instructions to make sure they are clear and accurate. Make sure that you are given enough medication to last until your next medication refill appointment. 5. Taking Medication: Take medication as prescribed. Never take more pills than instructed. Never take medication more frequently than prescribed. Taking less pills or less frequently is permitted and encouraged, when it comes to controlled substances (written prescriptions).  6. Inform other Doctors: Always inform, all of your healthcare providers, of all the medications you take. 7. Pain Medication from other Providers: You are not allowed to accept any additional pain medication from any other Doctor or Healthcare provider. There are two exceptions to this rule. (see below) In the event that you require additional pain medication, you are responsible for notifying us, as stated below. 8. Medication Agreement: You are responsible for carefully reading and following our Medication Agreement. This must be signed before receiving any prescriptions from our practice. Safely store a copy of your signed Agreement. Violations to the Agreement will result in no further prescriptions. (Additional copies of our Medication Agreement are available upon request.) 9. Laws, Rules,  & Regulations: All patients are expected to follow all Federal and [employer], [employer], Rules, [employer]. Ignorance of the Laws does not constitute a valid excuse. The use of any illegal substances is prohibited. 10. Adopted CDC guidelines & recommendations: Target dosing levels will be at or below 60 MME/day. Use of benzodiazepines** is not recommended.  Exceptions: There are only two exceptions to the rule of not receiving pain medications from other Healthcare Providers. 1. Exception #1 (Emergencies): In the event of an emergency (i.e.: accident requiring emergency care), you are allowed to receive additional pain medication.  However, you are responsible for: As soon as you are able, call our office [phone], at any time of the day or night, and leave a message stating your name, the date and nature of the emergency, and the name and dose of the medication prescribed. In the event that your call is answered by a member of our staff, make sure to document and save the date, time, and the name of the person that took your information.  2. Exception #2 (Planned Surgery): In the event that you are scheduled by another doctor or dentist to have any type of surgery or procedure, you are allowed (for a period no longer than 30 days), to receive additional pain medication, for the acute post-op pain. However, in this case, you are responsible for picking up a copy of our "Post-op Pain Management for Surgeons" handout, and giving it to your surgeon or dentist. This document is available at our office, and does not require an appointment to obtain it. Simply go to our office during business hours (Monday-Thursday from 8:00 AM to 4:00 PM) (Friday 8:00 AM to 12:00 Noon) or if you have a scheduled appointment with [place], prior to your surgery, and ask for it by name. In addition, you will need to provide [place] with your name, name of your surgeon, type of surgery, and date of procedure or surgery.  *Opioid  medications include: morphine, codeine, oxycodone, oxymorphone, hydrocodone, hydromorphone, meperidine, tramadol, tapentadol, buprenorphine, fentanyl, methadone. **Benzodiazepine medications include: diazepam (Valium), alprazolam (Xanax), clonazepam (Klonopine), lorazepam (Ativan), clorazepate (Tranxene), chlordiazepoxide (Librium), estazolam (Prosom), oxazepam (Serax), temazepam (Restoril), triazolam (Halcion) (Last updated: 08/07/2017) ____________________________________________________________________________________________ Dennis Bast were given 3 prescriptions for Oxycodone. Prescriptions for Baclofen, Gabapentin, Flexeril, and Trazadone were sent to your pharmacy.

## 2017-12-21 LAB — TOXASSURE SELECT 13 (MW), URINE

## 2017-12-22 ENCOUNTER — Other Ambulatory Visit: Payer: Self-pay | Admitting: Family Medicine

## 2017-12-22 DIAGNOSIS — E049 Nontoxic goiter, unspecified: Secondary | ICD-10-CM

## 2018-01-22 NOTE — Progress Notes (Signed)
01/23/2018 1:34 PM   Kristina Gomez 06/01/1966 [id number]  Referring provider: Frazier Richards, [address] [place], [place] [address]  Chief Complaint  Patient presents with  . Urinary Incontinence    New Patient    HPI: Patient is a 52 -year-old Caucasian female who is referred to [place] by Dr. Beverlyn Roux for urinary incontinence.  Patient states that she has had urinary incontinence for years.    Patient has incontinence with SUI and urge.   She is experiencing few incontinent episodes during the day. She is experiencing 0 incontinent episodes during the night.  She states she is not able to exercise as she loses urine when she runs.  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  Her incontinence volume is moderate.   She is wearing panty liners 6 to 7.    She is having associated urinary frequency several, strong urgency, nocturia x 2 and.  Patient denies any gross hematuria, dysuria or suprapubic/flank pain.  Patient denies any fevers, chills, nausea or vomiting.   She does not have a history of urinary tract infections, STI's or injury to the bladder.   She does/does not have a history of nephrolithiasis, GU surgery or GU trauma.     She is post menopausal.   She admits to constipation and/or diarrhea.   Non contrast CT in 09/2017 was negative at [facility].    She is drinking a lot of water daily.   She is drinking one cup of coffee daily.  She does not sodas, teas or juice.  She does not drink alcohol.     PMH: Past Medical History:  Diagnosis Date  . Anxiety   . Arthritis   . Asthma   . Chronic pain   . Chronic pain syndrome 04/10/2015  . CPAP (continuous positive airway pressure) dependence 01/22/2016  . Degenerative arthritis of hip 04/10/2015  . Depression   . Diaphragmatic hernia 04/10/2015    . Food poisoning 09/27/2017   [facility] in [place], [place]  . GERD (gastroesophageal reflux disease)   . Headache   . Hearing loss   . Hiatal hernia   . Hypertension   . Migraines   . Neuromuscular disorder ([place])    peripheral neuropathy  . Osteoporosis   . Reflux   . Shortness of breath dyspnea   . Sleep apnea    No longer has due to >70 lbs weight loss  . Vision loss     Surgical History: Past Surgical History:  Procedure Laterality Date  . DILATION AND CURETTAGE OF UTERUS    . ESOPHAGOGASTRODUODENOSCOPY (EGD) WITH PROPOFOL N/A 05/08/2017   Procedure: ESOPHAGOGASTRODUODENOSCOPY (EGD) WITH PROPOFOL;  Surgeon: Jonathon Bellows, MD;  Location: [facility] ENDOSCOPY;  Service: Gastroenterology;  Laterality: N/A;  . INNER EAR SURGERY Bilateral     Home Medications:  Allergies as of 01/23/2018      Reactions   Contrast Media [iodinated Diagnostic Agents] Swelling   Iodine    Other Hives, Other (See Comments)   Spider bite (has had two) each time caused blisters/whelps and redness and pt had to go to ER. She now carries Epipen for this reason. Spider bite (has had two) each time caused blisters/whelps and redness and pt had to go to ER. She now  carries Epipen for this reason.   Sulfa Antibiotics Rash, Hives   Blisters, hive, itching      Medication List        Accurate as of 01/23/18 11:59 PM. Always use your most recent med list.          acetaminophen 500 MG tablet Commonly known as:  TYLENOL Take 1,000 mg by mouth every 6 (six) hours as needed.   albuterol-ipratropium 18-103 MCG/ACT inhaler Commonly known as:  COMBIVENT Inhale into the lungs every 6 (six) hours as needed for wheezing or shortness of breath.   baclofen 10 MG tablet Commonly known as:  LIORESAL Take 1 tablet (10 mg total) by mouth at bedtime as needed for muscle spasms. Hold Flexeril when taking this medication.   cholecalciferol 1000 units tablet Commonly known as:  VITAMIN D Take 1,000  Units by mouth daily.   cyclobenzaprine 10 MG tablet Commonly known as:  FLEXERIL Take 1 tablet (10 mg total) by mouth 3 (three) times daily as needed for muscle spasms.   furosemide 20 MG tablet Commonly known as:  LASIX Take 1 tablet (20 mg total) by mouth daily.   gabapentin 300 MG capsule Commonly known as:  NEURONTIN Take 1-2 capsules (300-600 mg total) by mouth 3 (three) times daily. Follow written titration schedule.   KLOR-CON 10 10 MEQ tablet Generic drug:  potassium chloride TAKE 1 BY MOUTH DAILY FOR LOW POTASSIUM   lisinopril 40 MG tablet Commonly known as:  PRINIVIL,ZESTRIL Take 40 mg by mouth daily.   meloxicam 15 MG tablet Commonly known as:  MOBIC Take 1 tablet (15 mg total) by mouth daily.   multivitamin capsule Take 1 capsule by mouth daily.   MYRBETRIQ 25 MG Tb24 tablet Generic drug:  mirabegron ER Take 25 mg by mouth daily.   ofloxacin 0.3 % OTIC solution Commonly known as:  FLOXIN   oxyCODONE 5 MG immediate release tablet Commonly known as:  Oxy IR/ROXICODONE Take 1 tablet (5 mg total) by mouth every 6 (six) hours as needed for severe pain.   oxyCODONE 5 MG immediate release tablet Commonly known as:  Oxy IR/ROXICODONE Take 1 tablet (5 mg total) by mouth every 6 (six) hours as needed for severe pain. Start taking on:  01/30/2018   oxyCODONE 5 MG immediate release tablet Commonly known as:  Oxy IR/ROXICODONE Take 1 tablet (5 mg total) by mouth every 6 (six) hours as needed for severe pain. Start taking on:  03/01/2018   pantoprazole 40 MG tablet Commonly known as:  PROTONIX Take 40 mg by mouth 2 (two) times daily.   traZODone 100 MG tablet Commonly known as:  DESYREL Take 1 tablet (100 mg total) by mouth at bedtime.       Allergies:  Allergies  Allergen Reactions  . Contrast Media [Iodinated Diagnostic Agents] Swelling  . Iodine   . Other Hives and Other (See Comments)    Spider bite (has had two) each time caused blisters/whelps and  redness and pt had to go to ER. She now carries Epipen for this reason. Spider bite (has had two) each time caused blisters/whelps and redness and pt had to go to ER. She now carries Epipen for this reason.  . Sulfa Antibiotics Rash and Hives    Blisters, hive, itching    Family History: Family History  Problem Relation Age of Onset  . Diabetes Mother   . Heart disease Mother   . Kidney disease Mother   . COPD Father   .  Breast cancer Paternal Aunt 36    Social History:  reports that she quit smoking about 20 years ago. She has never used smokeless tobacco. She reports that she does not drink alcohol or use drugs.  ROS: UROLOGY Frequent Urination?: Yes Hard to postpone urination?: Yes Burning/pain with urination?: No Get up at night to urinate?: Yes Leakage of urine?: Yes Urine stream starts and stops?: Yes Trouble starting stream?: No Do you have to strain to urinate?: No Blood in urine?: No Urinary tract infection?: No Sexually transmitted disease?: No Injury to kidneys or bladder?: No Painful intercourse?: No Weak stream?: No Currently pregnant?: No Vaginal bleeding?: No Last menstrual period?: n  Gastrointestinal Nausea?: No Vomiting?: No Indigestion/heartburn?: Yes Diarrhea?: Yes Constipation?: Yes  Constitutional Fever: No Night sweats?: No Weight loss?: Yes Fatigue?: Yes  Skin Skin rash/lesions?: Yes Itching?: Yes  Eyes Blurred vision?: Yes Double vision?: Yes  Ears/Nose/Throat Sore throat?: Yes Sinus problems?: Yes  Hematologic/Lymphatic Swollen glands?: Yes Easy bruising?: Yes  Cardiovascular Leg swelling?: Yes Chest pain?: Yes  Respiratory Cough?: Yes Shortness of breath?: Yes  Endocrine Excessive thirst?: Yes  Musculoskeletal Back pain?: Yes Joint pain?: Yes  Neurological Headaches?: Yes Dizziness?: Yes  Psychologic Depression?: Yes Anxiety?: Yes  Physical Exam: BP (!) 141/82   Pulse 74   Ht 5\' 6"  (1.676 m)   Wt  240 lb (108.9 kg)   BMI 38.74 kg/m   Constitutional: Well nourished. Alert and oriented, No acute distress. HEENT: [place] AT, moist mucus membranes. Trachea midline, no masses. Cardiovascular: No clubbing, cyanosis, or edema. Respiratory: Normal respiratory effort, no increased work of breathing. GI: Abdomen is soft, non tender, non distended, no abdominal masses. Liver and spleen not palpable.  No hernias appreciated.  Stool sample for occult testing is not indicated.   GU: No CVA tenderness.  No bladder fullness or masses.  Normal external genitalia, normal pubic hair distribution, no lesions.  Normal urethral meatus, no lesions, no prolapse, no discharge.   No urethral masses, tenderness and/or tenderness. No bladder fullness, tenderness or masses. Normal vagina mucosa, good estrogen effect, no discharge, no lesions, poor pelvic support, grade II cystocele. no rectocele noted.  No cervical motion tenderness.  Uterus is freely mobile and non-fixed.  No adnexal/parametria masses or tenderness noted.  Anus and perineum are without rashes or lesions.    Skin: No rashes, bruises or suspicious lesions. Lymph: No cervical or inguinal adenopathy. Neurologic: Grossly intact, no focal deficits, moving all 4 extremities. Psychiatric: Normal mood and affect.  Laboratory Data: Lab Results  Component Value Date   WBC 6.1 08/27/2015   HGB 13.6 08/27/2015   HCT 39.4 08/27/2015   MCV 84.6 08/27/2015   PLT 190 08/27/2015    Lab Results  Component Value Date   CREATININE 0.97 07/23/2016    No results found for: PSA  No results found for: TESTOSTERONE  No results found for: HGBA1C  No results found for: TSH  No results found for: CHOL, HDL, CHOLHDL, VLDL, LDLCALC  Lab Results  Component Value Date   AST 30 07/23/2016   Lab Results  Component Value Date   ALT 43 07/23/2016   No components found for: ALKALINEPHOPHATASE No components found for: BILIRUBINTOTAL  No results found for:  ESTRADIOL  Urinalysis    Component Value Date/Time   COLORURINE Red 03/18/2012 2137   APPEARANCEUR Turbid 03/18/2012 2137   LABSPEC 1.016 03/18/2012 2137   PHURINE 8.0 03/18/2012 2137   GLUCOSEU Negative 03/18/2012 2137   HGBUR 3+  03/18/2012 2137   BILIRUBINUR Negative 03/18/2012 2137   KETONESUR Negative 03/18/2012 2137   PROTEINUR 100 mg/dL 03/18/2012 2137   NITRITE Negative 03/18/2012 2137   LEUKOCYTESUR 1+ 03/18/2012 2137    I have reviewed the labs.   Pertinent Imaging: Results for RICARDO, KAYES (MRN [id number]) as of 01/24/2018 13:31  Ref. Range 01/23/2018 11:22  Scan Result Unknown 62ml   Assessment & Plan:    1. Mixed incontinence Discussed behavioral therapies, bladder training and bladder control strategies Offered a referral to PT for pelvic floor muscle training- patient deferred Offered medical therapy with anticholinergic therapy or beta-3 adrenergic receptor agonist and the potential side effects of each therapy - would like to try the beta-3 adrenergic receptor agonist (Myrbetriq).  Given Myrbetriq 25 mg samples, #28.  I have reviewed with the patient of the side effects of Myrbetriq, such as: elevation in BP, urinary retention and/or HA.    2. Cystocele Offered referral to gynecology for a pessary - patient deferred   Return for appoirntment with Dr.MacDiarmid .  These notes generated with voice recognition software. I apologize for typographical errors.  Zara Council, PA-C  [facility] [address] [place], [place] [address] [phone]

## 2018-01-23 ENCOUNTER — Ambulatory Visit (INDEPENDENT_AMBULATORY_CARE_PROVIDER_SITE_OTHER): Payer: Medicare Other | Admitting: Urology

## 2018-01-23 ENCOUNTER — Encounter: Payer: Self-pay | Admitting: Urology

## 2018-01-23 VITALS — BP 141/82 | HR 74 | Ht 66.0 in | Wt 240.0 lb

## 2018-01-23 DIAGNOSIS — N3946 Mixed incontinence: Secondary | ICD-10-CM | POA: Diagnosis not present

## 2018-01-23 DIAGNOSIS — N8111 Cystocele, midline: Secondary | ICD-10-CM | POA: Diagnosis not present

## 2018-01-23 LAB — BLADDER SCAN AMB NON-IMAGING

## 2018-01-27 ENCOUNTER — Ambulatory Visit
Admission: RE | Admit: 2018-01-27 | Discharge: 2018-01-27 | Disposition: A | Discharge status: Home / Self Care | Payer: Medicare Other | Source: Ambulatory Visit | Attending: Family Medicine | Admitting: Family Medicine

## 2018-01-27 DIAGNOSIS — E049 Nontoxic goiter, unspecified: Secondary | ICD-10-CM

## 2018-02-16 ENCOUNTER — Ambulatory Visit: Payer: Medicare Other | Admitting: Urology

## 2018-02-23 ENCOUNTER — Ambulatory Visit: Payer: Medicare Other | Admitting: Urology

## 2018-03-17 ENCOUNTER — Encounter: Payer: Self-pay | Admitting: Nurse Practitioner

## 2018-03-17 ENCOUNTER — Other Ambulatory Visit: Payer: Self-pay

## 2018-03-17 ENCOUNTER — Ambulatory Visit: Payer: Medicare Other | Attending: Nurse Practitioner | Admitting: Nurse Practitioner

## 2018-03-17 VITALS — BP 128/76 | HR 74 | Temp 98.6°F | Resp 18 | Ht 67.0 in | Wt 230.0 lb

## 2018-03-17 DIAGNOSIS — M797 Fibromyalgia: Secondary | ICD-10-CM | POA: Diagnosis not present

## 2018-03-17 DIAGNOSIS — F411 Generalized anxiety disorder: Secondary | ICD-10-CM | POA: Diagnosis not present

## 2018-03-17 DIAGNOSIS — M25512 Pain in left shoulder: Secondary | ICD-10-CM | POA: Insufficient documentation

## 2018-03-17 DIAGNOSIS — Z79891 Long term (current) use of opiate analgesic: Secondary | ICD-10-CM

## 2018-03-17 DIAGNOSIS — G4733 Obstructive sleep apnea (adult) (pediatric): Secondary | ICD-10-CM | POA: Diagnosis not present

## 2018-03-17 DIAGNOSIS — G8929 Other chronic pain: Secondary | ICD-10-CM

## 2018-03-17 DIAGNOSIS — M792 Neuralgia and neuritis, unspecified: Secondary | ICD-10-CM

## 2018-03-17 DIAGNOSIS — M79605 Pain in left leg: Secondary | ICD-10-CM | POA: Diagnosis not present

## 2018-03-17 DIAGNOSIS — M47812 Spondylosis without myelopathy or radiculopathy, cervical region: Secondary | ICD-10-CM | POA: Diagnosis not present

## 2018-03-17 DIAGNOSIS — G894 Chronic pain syndrome: Secondary | ICD-10-CM

## 2018-03-17 DIAGNOSIS — M79604 Pain in right leg: Secondary | ICD-10-CM | POA: Insufficient documentation

## 2018-03-17 DIAGNOSIS — M542 Cervicalgia: Secondary | ICD-10-CM | POA: Insufficient documentation

## 2018-03-17 DIAGNOSIS — K219 Gastro-esophageal reflux disease without esophagitis: Secondary | ICD-10-CM | POA: Insufficient documentation

## 2018-03-17 DIAGNOSIS — M4726 Other spondylosis with radiculopathy, lumbar region: Secondary | ICD-10-CM | POA: Insufficient documentation

## 2018-03-17 DIAGNOSIS — F329 Major depressive disorder, single episode, unspecified: Secondary | ICD-10-CM | POA: Diagnosis not present

## 2018-03-17 DIAGNOSIS — E042 Nontoxic multinodular goiter: Secondary | ICD-10-CM | POA: Diagnosis not present

## 2018-03-17 DIAGNOSIS — M47898 Other spondylosis, sacral and sacrococcygeal region: Secondary | ICD-10-CM | POA: Insufficient documentation

## 2018-03-17 DIAGNOSIS — M47818 Spondylosis without myelopathy or radiculopathy, sacral and sacrococcygeal region: Secondary | ICD-10-CM | POA: Diagnosis not present

## 2018-03-17 DIAGNOSIS — M810 Age-related osteoporosis without current pathological fracture: Secondary | ICD-10-CM | POA: Diagnosis not present

## 2018-03-17 DIAGNOSIS — M25552 Pain in left hip: Secondary | ICD-10-CM

## 2018-03-17 DIAGNOSIS — E7800 Pure hypercholesterolemia, unspecified: Secondary | ICD-10-CM | POA: Diagnosis not present

## 2018-03-17 DIAGNOSIS — M1712 Unilateral primary osteoarthritis, left knee: Secondary | ICD-10-CM | POA: Diagnosis not present

## 2018-03-17 DIAGNOSIS — M47816 Spondylosis without myelopathy or radiculopathy, lumbar region: Secondary | ICD-10-CM

## 2018-03-17 DIAGNOSIS — M7918 Myalgia, other site: Secondary | ICD-10-CM

## 2018-03-17 DIAGNOSIS — R252 Cramp and spasm: Secondary | ICD-10-CM

## 2018-03-17 DIAGNOSIS — I10 Essential (primary) hypertension: Secondary | ICD-10-CM | POA: Diagnosis not present

## 2018-03-17 DIAGNOSIS — M25511 Pain in right shoulder: Secondary | ICD-10-CM | POA: Diagnosis not present

## 2018-03-17 DIAGNOSIS — M545 Low back pain: Secondary | ICD-10-CM | POA: Diagnosis present

## 2018-03-17 DIAGNOSIS — M461 Sacroiliitis, not elsewhere classified: Secondary | ICD-10-CM

## 2018-03-17 DIAGNOSIS — G43909 Migraine, unspecified, not intractable, without status migrainosus: Secondary | ICD-10-CM | POA: Insufficient documentation

## 2018-03-17 MED ORDER — OXYCODONE HCL 5 MG PO TABS: 5.0000 mg | ORAL_TABLET | Freq: Four times a day (QID) | ORAL | 0 refills | Status: DC | PRN

## 2018-03-17 MED ORDER — GABAPENTIN 300 MG PO CAPS: 300.0000 mg | ORAL_CAPSULE | Freq: Three times a day (TID) | ORAL | 2 refills | Status: DC

## 2018-03-17 MED ORDER — CYCLOBENZAPRINE HCL 10 MG PO TABS: 10.0000 mg | ORAL_TABLET | Freq: Three times a day (TID) | ORAL | 2 refills | Status: DC | PRN

## 2018-03-17 NOTE — Patient Instructions (Addendum)
____________________________________________________________________________________________  Medication Rules  Applies to: All patients receiving prescriptions (written or electronic).  Pharmacy of record: Pharmacy where electronic prescriptions will be sent. If written prescriptions are taken to a different pharmacy, please inform the nursing staff. The pharmacy listed in the electronic medical record should be the one where you would like electronic prescriptions to be sent.  Prescription refills: Only during scheduled appointments. Applies to both, written and electronic prescriptions.  NOTE: The following applies primarily to controlled substances (Opioid* Pain Medications).   Patient's responsibilities: 1. Pain Pills: Bring all pain pills to every appointment (except for procedure appointments). 2. Pill Bottles: Bring pills in original pharmacy bottle. Always bring newest bottle. Bring bottle, even if empty. 3. Medication refills: You are responsible for knowing and keeping track of what medications you need refilled. The day before your appointment, write a list of all prescriptions that need to be refilled. Bring that list to your appointment and give it to the admitting nurse. Prescriptions will be written only during appointments. If you forget a medication, it will not be "Called in", "Faxed", or "electronically sent". You will need to get another appointment to get these prescribed. 4. Prescription Accuracy: You are responsible for carefully inspecting your prescriptions before leaving our office. Have the discharge nurse carefully go over each prescription with you, before taking them home. Make sure that your name is accurately spelled, that your address is correct. Check the name and dose of your medication to make sure it is accurate. Check the number of pills, and the written instructions to make sure they are clear and accurate. Make sure that you are given enough medication to last  until your next medication refill appointment. 5. Taking Medication: Take medication as prescribed. Never take more pills than instructed. Never take medication more frequently than prescribed. Taking less pills or less frequently is permitted and encouraged, when it comes to controlled substances (written prescriptions).  6. Inform other Doctors: Always inform, all of your healthcare providers, of all the medications you take. 7. Pain Medication from other Providers: You are not allowed to accept any additional pain medication from any other Doctor or Healthcare provider. There are two exceptions to this rule. (see below) In the event that you require additional pain medication, you are responsible for notifying us, as stated below. 8. Medication Agreement: You are responsible for carefully reading and following our Medication Agreement. This must be signed before receiving any prescriptions from our practice. Safely store a copy of your signed Agreement. Violations to the Agreement will result in no further prescriptions. (Additional copies of our Medication Agreement are available upon request.) 9. Laws, Rules, & Regulations: All patients are expected to follow all Federal and State Laws, Statutes, Rules, & Regulations. Ignorance of the Laws does not constitute a valid excuse. The use of any illegal substances is prohibited. 10. Adopted CDC guidelines & recommendations: Target dosing levels will be at or below 60 MME/day. Use of benzodiazepines** is not recommended.  Exceptions: There are only two exceptions to the rule of not receiving pain medications from other Healthcare Providers. 1. Exception #1 (Emergencies): In the event of an emergency (i.e.: accident requiring emergency care), you are allowed to receive additional pain medication. However, you are responsible for: As soon as you are able, call our office [phone], at any time of the day or night, and leave a message stating your name, the  date and nature of the emergency, and the name and dose of the medication   prescribed. In the event that your call is answered by a member of our staff, make sure to document and save the date, time, and the name of the person that took your information.  2. Exception #2 (Planned Surgery): In the event that you are scheduled by another doctor or dentist to have any type of surgery or procedure, you are allowed (for a period no longer than 30 days), to receive additional pain medication, for the acute post-op pain. However, in this case, you are responsible for picking up a copy of our "Post-op Pain Management for Surgeons" handout, and giving it to your surgeon or dentist. This document is available at our office, and does not require an appointment to obtain it. Simply go to our office during business hours (Monday-Thursday from 8:00 AM to 4:00 PM) (Friday 8:00 AM to 12:00 Noon) or if you have a scheduled appointment with us, prior to your surgery, and ask for it by name. In addition, you will need to provide us with your name, name of your surgeon, type of surgery, and date of procedure or surgery.  *Opioid medications include: morphine, codeine, oxycodone, oxymorphone, hydrocodone, hydromorphone, meperidine, tramadol, tapentadol, buprenorphine, fentanyl, methadone. **Benzodiazepine medications include: diazepam (Valium), alprazolam (Xanax), clonazepam (Klonopine), lorazepam (Ativan), clorazepate (Tranxene), chlordiazepoxide (Librium), estazolam (Prosom), oxazepam (Serax), temazepam (Restoril), triazolam (Halcion) (Last updated: 08/07/2017) ____________________________________________________________________________________________   ____________________________________________________________________________________________  Pain Scale  Introduction: The pain score used by this practice is the Verbal Numerical Rating Scale (VNRS-11). This is an 11-point scale. It is for adults and children 10 years or  older. There are significant differences in how the pain score is reported, used, and applied. Forget everything you learned in the past and learn this scoring system.  General Information: The scale should reflect your current level of pain. Unless you are specifically asked for the level of your worst pain, or your average pain. If you are asked for one of these two, then it should be understood that it is over the past 24 hours.  Basic Activities of Daily Living (ADL): Personal hygiene, dressing, eating, transferring, and using restroom.  Instructions: Most patients tend to report their level of pain as a combination of two factors, their physical pain and their psychosocial pain. This last one is also known as "suffering" and it is reflection of how physical pain affects you socially and psychologically. From now on, report them separately. From this point on, when asked to report your pain level, report only your physical pain. Use the following table for reference.  Pain Clinic Pain Levels (0-5/10)  Pain Level Score  Description  No Pain 0   Mild pain 1 Nagging, annoying, but does not interfere with basic activities of daily living (ADL). Patients are able to eat, bathe, get dressed, toileting (being able to get on and off the toilet and perform personal hygiene functions), transfer (move in and out of bed or a chair without assistance), and maintain continence (able to control bladder and bowel functions). Blood pressure and heart rate are unaffected. A normal heart rate for a healthy adult ranges from 60 to 100 bpm (beats per minute).   Mild to moderate pain 2 Noticeable and distracting. Impossible to hide from other people. More frequent flare-ups. Still possible to adapt and function close to normal. It can be very annoying and may have occasional stronger flare-ups. With discipline, patients may get used to it and adapt.   Moderate pain 3 Interferes significantly with activities of daily  living (ADL).   It becomes difficult to feed, bathe, get dressed, get on and off the toilet or to perform personal hygiene functions. Difficult to get in and out of bed or a chair without assistance. Very distracting. With effort, it can be ignored when deeply involved in activities.   Moderately severe pain 4 Impossible to ignore for more than a few minutes. With effort, patients may still be able to manage work or participate in some social activities. Very difficult to concentrate. Signs of autonomic nervous system discharge are evident: dilated pupils (mydriasis); mild sweating (diaphoresis); sleep interference. Heart rate becomes elevated (>115 bpm). Diastolic blood pressure (lower number) rises above 100 mmHg. Patients find relief in laying down and not moving.   Severe pain 5 Intense and extremely unpleasant. Associated with frowning face and frequent crying. Pain overwhelms the senses.  Ability to do any activity or maintain social relationships becomes significantly limited. Conversation becomes difficult. Pacing back and forth is common, as getting into a comfortable position is nearly impossible. Pain wakes you up from deep sleep. Physical signs will be obvious: pupillary dilation; increased sweating; goosebumps; brisk reflexes; cold, clammy hands and feet; nausea, vomiting or dry heaves; loss of appetite; significant sleep disturbance with inability to fall asleep or to remain asleep. When persistent, significant weight loss is observed due to the complete loss of appetite and sleep deprivation.  Blood pressure and heart rate becomes significantly elevated. Caution: If elevated blood pressure triggers a pounding headache associated with blurred vision, then the patient should immediately seek attention at an urgent or emergency care unit, as these may be signs of an impending stroke.    Emergency Department Pain Levels (6-10/10)  Emergency Room Pain 6 Severely limiting. Requires emergency care  and should not be seen or managed at an outpatient pain management facility. Communication becomes difficult and requires great effort. Assistance to reach the emergency department may be required. Facial flushing and profuse sweating along with potentially dangerous increases in heart rate and blood pressure will be evident.   Distressing pain 7 Self-care is very difficult. Assistance is required to transport, or use restroom. Assistance to reach the emergency department will be required. Tasks requiring coordination, such as bathing and getting dressed become very difficult.   Disabling pain 8 Self-care is no longer possible. At this level, pain is disabling. The individual is unable to do even the most "basic" activities such as walking, eating, bathing, dressing, transferring to a bed, or toileting. Fine motor skills are lost. It is difficult to think clearly.   Incapacitating pain 9 Pain becomes incapacitating. Thought processing is no longer possible. Difficult to remember your own name. Control of movement and coordination are lost.   The worst pain imaginable 10 At this level, most patients pass out from pain. When this level is reached, collapse of the autonomic nervous system occurs, leading to a sudden drop in blood pressure and heart rate. This in turn results in a temporary and dramatic drop in blood flow to the brain, leading to a loss of consciousness. Fainting is one of the body's self defense mechanisms. Passing out puts the brain in a calmed state and causes it to shut down for a while, in order to begin the healing process.    Summary: 1. Refer to this scale when providing us with your pain level. 2. Be accurate and careful when reporting your pain level. This will help with your care. 3. Over-reporting your pain level will lead to loss of credibility. 4. Even   a level of 1/10 means that there is pain and will be treated at our facility. 5. High, inaccurate reporting will be  documented as "Symptom Exaggeration", leading to loss of credibility and suspicions of possible secondary gains such as obtaining more narcotics, or wanting to appear disabled, for fraudulent reasons. 6. Only pain levels of 5 or below will be seen at our facility. 7. Pain levels of 6 and above will be sent to the Emergency Department and the appointment cancelled. ____________________________________________________________________________________________   BMI Assessment: Estimated body mass index is 36.02 kg/m as calculated from the following:   Height as of this encounter: 5\' 7"  (1.702 m).   Weight as of this encounter: 230 lb (104.3 kg).  BMI interpretation table: BMI level Category Range association with higher incidence of chronic pain  <18 kg/m2 Underweight   18.5-24.9 kg/m2 Ideal body weight   25-29.9 kg/m2 Overweight Increased incidence by 20%  30-34.9 kg/m2 Obese (Class I) Increased incidence by 68%  35-39.9 kg/m2 Severe obesity (Class II) Increased incidence by 136%  >40 kg/m2 Extreme obesity (Class III) Increased incidence by 254%   Patient's current BMI Ideal Body weight  Body mass index is 36.02 kg/m. Ideal body weight: 61.6 kg (135 lb 12.9 oz) Adjusted ideal body weight: 78.7 kg (173 lb 7.7 oz)   BMI Readings from Last 4 Encounters:  03/17/18 36.02 kg/m  01/23/18 38.74 kg/m  12/16/17 37.59 kg/m  09/30/17 39.16 kg/m   Wt Readings from Last 4 Encounters:  03/17/18 230 lb (104.3 kg)  01/23/18 240 lb (108.9 kg)  12/16/17 240 lb (108.9 kg)  09/30/17 250 lb (113.4 kg)

## 2018-03-17 NOTE — Progress Notes (Signed)
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 total) by mouth every 6 (six) hours as needed for severe pain.    Dispense:  120 tablet    Refill:  0    Do not place this medication, or any other prescription from our practice, on "Automatic Refill". Patient may have prescription filled one day early if pharmacy is closed on scheduled refill date.    Order Specific Question:   Supervising Provider    Answer:   Milinda Pointer [[id number]]   New Prescriptions   No medications on file   Medications administered today: Mechele Claude A. Kope had no medications administered during this visit. Lab-work, procedure(s), and/or referral(s): Orders Placed This Encounter  Procedures  . ToxASSURE Select 13 (MW), Urine   Imaging and/or referral(s): None  Interventional therapies: Planned, scheduled, and/or pending:  Not at this time.  Declined any injections at this time   Considering:  Left L4-5 lumbar epiduralsteroid injection #2 Left L5-S1 transforaminalepidural steroid injection Right  L4-5 transforaminalepidural steroid injection Left diagnostic cervical epiduralsteroid injection Diagnostic bilateral cervical facet block Possible bilateral cervical facet radiofrequencyablation Diagnostic bilateralintra-articular knee injection Diagnostic bilateral genicular nerve block Possible bilateral genicular nerve radiofrequencyablation Diagnostic bilateral lumbar facet block Possible bilateral lumbar facet radiofrequencyablation Diagnostic bilateral sacroiliac joint block Possible bilateral sacroiliac joint radiofrequencyablation Diagnostic bilateralintra-articular hip joint injection Possible bilateral hip radiofrequency ablation Diagnostic bilateralintra-articular shoulder joint injection Diagnostic bilateral suprascapular nerve block Possible bilateral suprascapular nerve radiofrequencyablation   Palliative PRN treatment(s):      Provider-requested follow-up: Return in about 3 months (around 06/17/2018) for MedMgmt.  Future Appointments  Date Time Provider [place]  03/30/2018  2:45 PM Bjorn Loser, MD [facility] None  06/17/2018 10:30 AM Vevelyn Francois, NP [facility] None   Primary Care Physician: Frazier Richards, MD Location: [facility] Note by: Vevelyn Francois NP Date: 03/17/2018; Time: 2:45 PM  Pain Score Disclaimer: We use the NRS-11 scale. This is a self-reported, subjective measurement of pain severity with only modest accuracy. It is used primarily to identify changes within a particular patient. It must be understood that outpatient pain scales are significantly less accurate that those used for research, where they can be applied under ideal controlled circumstances with minimal exposure to variables. In reality, the score is likely to be a combination of pain intensity and pain affect, where pain affect describes the degree of emotional arousal or changes in action readiness caused by the sensory experience of  pain. Factors such as social and work situation, setting, emotional state, anxiety levels, expectation, and prior pain experience may influence pain perception and show large inter-individual differences that may also be affected by time variables.  Patient instructions provided during this  appointment: Patient Instructions   ____________________________________________________________________________________________  Medication Rules  Applies to: All patients receiving prescriptions (written or electronic).  Pharmacy of record: Pharmacy where electronic prescriptions will be sent. If written prescriptions are taken to a different pharmacy, please inform the nursing staff. The pharmacy listed in the electronic medical record should be the one where you would like electronic prescriptions to be sent.  Prescription refills: Only during scheduled appointments. Applies to both, written and electronic prescriptions.  NOTE: The following applies primarily to controlled substances (Opioid* Pain Medications).   Patient's responsibilities: 1. Pain Pills: Bring all pain pills to every appointment (except for procedure appointments). 2. Pill Bottles: Bring pills in original pharmacy bottle. Always bring newest bottle. Bring bottle, even if empty. 3. Medication refills: You are responsible for knowing and keeping track of what medications you need refilled. The day before your appointment, write a list of all prescriptions that need to be refilled. Bring that list to your appointment and give it to the admitting nurse. Prescriptions will be written only during appointments. If you forget a medication, it will not be "Called in", "Faxed", or "electronically sent". You will need to get another appointment to get these prescribed. 4. Prescription Accuracy: You are responsible for carefully inspecting your prescriptions before leaving our office. Have the discharge nurse carefully go over each prescription with  you, before taking them home. Make sure that your name is accurately spelled, that your address is correct. Check the name and dose of your medication to make sure it is accurate. Check the number of pills, and the written instructions to make sure they are clear and accurate. Make sure that you are given enough medication to last until your next medication refill appointment. 5. Taking Medication: Take medication as prescribed. Never take more pills than instructed. Never take medication more frequently than prescribed. Taking less pills or less frequently is permitted and encouraged, when it comes to controlled substances (written prescriptions).  6. Inform other Doctors: Always inform, all of your healthcare providers, of all the medications you take. 7. Pain Medication from other Providers: You are not allowed to accept any additional pain medication from any other Doctor or Healthcare provider. There are two exceptions to this rule. (see below) In the event that you require additional pain medication, you are responsible for notifying us, as stated below. 8. Medication Agreement: You are responsible for carefully reading and following our Medication Agreement. This must be signed before receiving any prescriptions from our practice. Safely store a copy of your signed Agreement. Violations to the Agreement will result in no further prescriptions. (Additional copies of our Medication Agreement are available upon request.) 9. Laws, Rules, & Regulations: All patients are expected to follow all Federal and [employer], Rules, [employer]. Ignorance of the Laws does not constitute a valid excuse. The use of any illegal substances is prohibited. 10. Adopted CDC guidelines & recommendations: Target dosing levels will be at or below 60 MME/day. Use of benzodiazepines** is not recommended.  Exceptions: There are only two exceptions to the rule of not receiving pain medications from other Healthcare  Providers. 1. Exception #1 (Emergencies): In the event of an emergency (i.e.: accident requiring emergency care), you are allowed to receive additional pain medication. However, you are responsible for: As soon as you are able, call our office [phone], at any time of the day or night, and leave a message stating your name, the date and nature of the emergency, and the name  and dose of the medication prescribed. In the event that your call is answered by a member of our staff, make sure to document and save the date, time, and the name of the person that took your information.  2. Exception #2 (Planned Surgery): In the event that you are scheduled by another doctor or dentist to have any type of surgery or procedure, you are allowed (for a period no longer than 30 days), to receive additional pain medication, for the acute post-op pain. However, in this case, you are responsible for picking up a copy of our "Post-op Pain Management for Surgeons" handout, and giving it to your surgeon or dentist. This document is available at our office, and does not require an appointment to obtain it. Simply go to our office during business hours (Monday-Thursday from 8:00 AM to 4:00 PM) (Friday 8:00 AM to 12:00 Noon) or if you have a scheduled appointment with [place], prior to your surgery, and ask for it by name. In addition, you will need to provide [place] with your name, name of your surgeon, type of surgery, and date of procedure or surgery.  *Opioid medications include: morphine, codeine, oxycodone, oxymorphone, hydrocodone, hydromorphone, meperidine, tramadol, tapentadol, buprenorphine, fentanyl, methadone. **Benzodiazepine medications include: diazepam (Valium), alprazolam (Xanax), clonazepam (Klonopine), lorazepam (Ativan), clorazepate (Tranxene), chlordiazepoxide (Librium), estazolam (Prosom), oxazepam (Serax), temazepam (Restoril), triazolam (Halcion) (Last updated:  08/07/2017) ____________________________________________________________________________________________   ____________________________________________________________________________________________  Pain Scale  Introduction: The pain score used by this practice is the Verbal Numerical Rating Scale (VNRS-11). This is an 11-point scale. It is for adults and children 10 years or older. There are significant differences in how the pain score is reported, used, and applied. Forget everything you learned in the past and learn this scoring system.  General Information: The scale should reflect your current level of pain. Unless you are specifically asked for the level of your worst pain, or your average pain. If you are asked for one of these two, then it should be understood that it is over the past 24 hours.  Basic Activities of Daily Living (ADL): Personal hygiene, dressing, eating, transferring, and using restroom.  Instructions: Most patients tend to report their level of pain as a combination of two factors, their physical pain and their psychosocial pain. This last one is also known as "suffering" and it is reflection of how physical pain affects you socially and psychologically. From now on, report them separately. From this point on, when asked to report your pain level, report only your physical pain. Use the following table for reference.  Pain Clinic Pain Levels (0-5/10)  Pain Level Score  Description  No Pain 0   Mild pain 1 Nagging, annoying, but does not interfere with basic activities of daily living (ADL). Patients are able to eat, bathe, get dressed, toileting (being able to get on and off the toilet and perform personal hygiene functions), transfer (move in and out of bed or a chair without assistance), and maintain continence (able to control bladder and bowel functions). Blood pressure and heart rate are unaffected. A normal heart rate for a healthy adult ranges from 60 to 100 bpm  (beats per minute).   Mild to moderate pain 2 Noticeable and distracting. Impossible to hide from other people. More frequent flare-ups. Still possible to adapt and function close to normal. It can be very annoying and may have occasional stronger flare-ups. With discipline, patients may get used to it and adapt.   Moderate pain 3 Interferes significantly with  activities of daily living (ADL). It becomes difficult to feed, bathe, get dressed, get on and off the toilet or to perform personal hygiene functions. Difficult to get in and out of bed or a chair without assistance. Very distracting. With effort, it can be ignored when deeply involved in activities.   Moderately severe pain 4 Impossible to ignore for more than a few minutes. With effort, patients may still be able to manage work or participate in some social activities. Very difficult to concentrate. Signs of autonomic nervous system discharge are evident: dilated pupils (mydriasis); mild sweating (diaphoresis); sleep interference. Heart rate becomes elevated (>115 bpm). Diastolic blood pressure (lower number) rises above 100 mmHg. Patients find relief in laying down and not moving.   Severe pain 5 Intense and extremely unpleasant. Associated with frowning face and frequent crying. Pain overwhelms the senses.  Ability to do any activity or maintain social relationships becomes significantly limited. Conversation becomes difficult. Pacing back and forth is common, as getting into a comfortable position is nearly impossible. Pain wakes you up from deep sleep. Physical signs will be obvious: pupillary dilation; increased sweating; goosebumps; brisk reflexes; cold, clammy hands and feet; nausea, vomiting or dry heaves; loss of appetite; significant sleep disturbance with inability to fall asleep or to remain asleep. When persistent, significant weight loss is observed due to the complete loss of appetite and sleep deprivation.  Blood pressure and heart  rate becomes significantly elevated. Caution: If elevated blood pressure triggers a pounding headache associated with blurred vision, then the patient should immediately seek attention at an urgent or emergency care unit, as these may be signs of an impending stroke.    Emergency Department Pain Levels (6-10/10)  Emergency Room Pain 6 Severely limiting. Requires emergency care and should not be seen or managed at an outpatient pain management facility. Communication becomes difficult and requires great effort. Assistance to reach the emergency department may be required. Facial flushing and profuse sweating along with potentially dangerous increases in heart rate and blood pressure will be evident.   Distressing pain 7 Self-care is very difficult. Assistance is required to transport, or use restroom. Assistance to reach the emergency department will be required. Tasks requiring coordination, such as bathing and getting dressed become very difficult.   Disabling pain 8 Self-care is no longer possible. At this level, pain is disabling. The individual is unable to do even the most "basic" activities such as walking, eating, bathing, dressing, transferring to a bed, or toileting. Fine motor skills are lost. It is difficult to think clearly.   Incapacitating pain 9 Pain becomes incapacitating. Thought processing is no longer possible. Difficult to remember your own name. Control of movement and coordination are lost.   The worst pain imaginable 10 At this level, most patients pass out from pain. When this level is reached, collapse of the autonomic nervous system occurs, leading to a sudden drop in blood pressure and heart rate. This in turn results in a temporary and dramatic drop in blood flow to the brain, leading to a loss of consciousness. Fainting is one of the body's self defense mechanisms. Passing out puts the brain in a calmed state and causes it to shut down for a while, in order to begin the  healing process.    Summary: 1. Refer to this scale when providing [place] with your pain level. 2. Be accurate and careful when reporting your pain level. This will help with your care. 3. Over-reporting your pain level will lead  to loss of credibility. 4. Even a level of 1/10 means that there is pain and will be treated at our facility. 5. High, inaccurate reporting will be documented as "Symptom Exaggeration", leading to loss of credibility and suspicions of possible secondary gains such as obtaining more narcotics, or wanting to appear disabled, for fraudulent reasons. 6. Only pain levels of 5 or below will be seen at our facility. 7. Pain levels of 6 and above will be sent to the Emergency Department and the appointment cancelled. ____________________________________________________________________________________________   BMI Assessment: Estimated body mass index is 36.02 kg/m as calculated from the following:   Height as of this encounter: '5\' 7"'  (1.702 m).   Weight as of this encounter: 230 lb (104.3 kg).  BMI interpretation table: BMI level Category Range association with higher incidence of chronic pain  <18 kg/m2 Underweight   18.5-24.9 kg/m2 Ideal body weight   25-29.9 kg/m2 Overweight Increased incidence by 20%  30-34.9 kg/m2 Obese (Class I) Increased incidence by 68%  35-39.9 kg/m2 Severe obesity (Class II) Increased incidence by 136%  >40 kg/m2 Extreme obesity (Class III) Increased incidence by 254%   Patient's current BMI Ideal Body weight  Body mass index is 36.02 kg/m. Ideal body weight: 61.6 kg (135 lb 12.9 oz) Adjusted ideal body weight: 78.7 kg (173 lb 7.7 oz)   BMI Readings from Last 4 Encounters:  03/17/18 36.02 kg/m  01/23/18 38.74 kg/m  12/16/17 37.59 kg/m  09/30/17 39.16 kg/m   Wt Readings from Last 4 Encounters:  03/17/18 230 lb (104.3 kg)  01/23/18 240 lb (108.9 kg)  12/16/17 240 lb (108.9 kg)  09/30/17 250 lb (113.4 kg)

## 2018-03-17 NOTE — Progress Notes (Signed)
Nursing Pain Medication Assessment:  Safety precautions to be maintained throughout the outpatient stay will include: orient to surroundings, keep bed in low position, maintain call bell within reach at all times, provide assistance with transfer out of bed and ambulation.  Medication Inspection Compliance: Pill count conducted under aseptic conditions, in front of the patient. Neither the pills nor the bottle was removed from the patient's sight at any time. Once count was completed pills were immediately returned to the patient in their original bottle.  Medication: Oxycodone IR Pill/Patch Count: 69 of 120 pills remain Pill/Patch Appearance: Markings consistent with prescribed medication Bottle Appearance: Standard pharmacy container. Clearly labeled. Filled Date: 09 / 24 / 2019 Last Medication intake:  Today

## 2018-03-18 ENCOUNTER — Other Ambulatory Visit: Payer: Self-pay | Admitting: Otolaryngology

## 2018-03-18 DIAGNOSIS — E041 Nontoxic single thyroid nodule: Secondary | ICD-10-CM

## 2018-03-20 ENCOUNTER — Emergency Department: Payer: Medicare Other

## 2018-03-20 ENCOUNTER — Other Ambulatory Visit: Payer: Self-pay

## 2018-03-20 ENCOUNTER — Emergency Department
Admission: EM | Admit: 2018-03-20 | Discharge: 2018-03-20 | Disposition: A | Discharge status: Home / Self Care | Payer: Medicare Other | Attending: Emergency Medicine | Admitting: Emergency Medicine

## 2018-03-20 DIAGNOSIS — R062 Wheezing: Secondary | ICD-10-CM | POA: Diagnosis present

## 2018-03-20 DIAGNOSIS — I10 Essential (primary) hypertension: Secondary | ICD-10-CM | POA: Insufficient documentation

## 2018-03-20 DIAGNOSIS — Z87891 Personal history of nicotine dependence: Secondary | ICD-10-CM | POA: Diagnosis not present

## 2018-03-20 DIAGNOSIS — R52 Pain, unspecified: Secondary | ICD-10-CM

## 2018-03-20 DIAGNOSIS — J45901 Unspecified asthma with (acute) exacerbation: Secondary | ICD-10-CM

## 2018-03-20 DIAGNOSIS — Z79899 Other long term (current) drug therapy: Secondary | ICD-10-CM | POA: Diagnosis not present

## 2018-03-20 MED ORDER — ALBUTEROL SULFATE (2.5 MG/3ML) 0.083% IN NEBU
5.0000 mg | INHALATION_SOLUTION | Freq: Once | RESPIRATORY_TRACT | Status: AC
  Administered 2018-03-20: 5 mg via RESPIRATORY_TRACT
  Filled 2018-03-20: qty 6

## 2018-03-20 MED ORDER — KETOROLAC TROMETHAMINE 30 MG/ML IJ SOLN
INTRAMUSCULAR | Status: AC
  Administered 2018-03-20: 30 mg via INTRAMUSCULAR
  Filled 2018-03-20: qty 1

## 2018-03-20 MED ORDER — KETOROLAC TROMETHAMINE 30 MG/ML IJ SOLN
30.0000 mg | Freq: Once | INTRAMUSCULAR | Status: AC
  Administered 2018-03-20: 30 mg via INTRAMUSCULAR

## 2018-03-20 NOTE — ED Notes (Signed)
No peripheral IV placed this visit.   Discharge instructions reviewed with patient. Questions fielded by this RN. Patient verbalizes understanding of instructions. Patient discharged home in stable condition per mcshane. No acute distress noted at time of discharge.    

## 2018-03-20 NOTE — ED Notes (Signed)
Patient is declining a nebulizer treatment at this time. Explained to patient she can change her mind at any time and to let the first nurse know.

## 2018-03-20 NOTE — ED Notes (Signed)
Patient transported to X-ray 
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
## 2018-03-20 NOTE — ED Triage Notes (Signed)
Patient to ED with symptoms of asthma exacerbation. Saw PMD yesterday and was given antibiotics and steroids. Today has done breathing treatments at home and they have not helped. Patient states her chest is hurting with each breath as well. Able to speak in complete sentences without difficulty.

## 2018-03-20 NOTE — ED Notes (Addendum)
Pt reports asthma exacerbation for 2 weeks taking steroids, antibiotics (Z-pack started yesterday), prescribed by PCP yesterday, pt reports today took a breathing treatment today and chest and head feel "hot to touch" had an asthma attack. Took inhaler and nebulizer not able to cough, wheezing. Pt talks in complete sentences.

## 2018-03-20 NOTE — ED Notes (Signed)
ED Provider at bedside. 

## 2018-03-20 NOTE — Discharge Instructions (Addendum)
Continue taking your home pain medications as prescribed, return to the emergency room for any new or worrisome symptoms including increased pain, worsening shortness of breath, change in her chronic pain, or something else of concern.  Continue taking the prednisone and the albuterol and the antibiotics as prescribed by your doctor.  If you have any other new or worrisome symptoms please return immediately to the emergency department.

## 2018-03-22 LAB — TOXASSURE SELECT 13 (MW), URINE

## 2018-03-30 ENCOUNTER — Ambulatory Visit (INDEPENDENT_AMBULATORY_CARE_PROVIDER_SITE_OTHER): Payer: Medicare Other | Admitting: Urology

## 2018-03-30 ENCOUNTER — Encounter: Payer: Self-pay | Admitting: Urology

## 2018-03-30 VITALS — BP 127/74 | HR 80

## 2018-03-30 DIAGNOSIS — N3946 Mixed incontinence: Secondary | ICD-10-CM | POA: Diagnosis not present

## 2018-03-30 NOTE — Progress Notes (Signed)
03/30/2018 3:11 PM   Kristina Gomez [birth year] [id number]  Referring provider: Center, [facility] [address] [place], [place] [address]  Chief Complaint  Patient presents with  . Urinary Incontinence    HPI: Kristina Gomez: In August 2019 patient given Myrbetriq and was noted to have a cystocele referred to gynecology for pessary but patient deferred  The patient leaks with coughing sneezing bending lifting.  Sometimes she has urge incontinence.  They increase the dosage of Myrbetriq to 50 mg and she thinks he is 80% improved now wearing 1 pad per day  At baseline she was wearing 2 pads a day moderately wet.  One time she had high volume leakage not associated with awareness triggered when she went from a sitting to standing position.  I think she gets urgency in the middle of the night but not true foot on the floor syndrome with urge incontinence.  She voids every 2 hours and gets up once or twice at night but more frequently recently but she is drinking a lot of water  She has been told she has crystals in her urine.  She is never had a stone or bladder surgery.  She does get bladder infections.  She has not had a hysterectomy.  No neurologic issues.  Bowel movements normal.  She has a history of spinal stenosis  Modifying factors: There are no other modifying factors  Associated signs and symptoms: There are no other associated signs and symptoms Aggravating and relieving factors: There are no other aggravating or relieving factors Severity: Moderate Duration: Persistent     PMH: Past Medical History:  Diagnosis Date  . Anxiety   . Arthritis   . Asthma   . Chronic pain   . Chronic pain syndrome 04/10/2015  . CPAP (continuous positive airway pressure) dependence 01/22/2016  . Degenerative arthritis of hip 04/10/2015  . Depression   . Diaphragmatic hernia 04/10/2015  . Food poisoning 09/27/2017   [facility] in [place], [place]  . GERD  (gastroesophageal reflux disease)   . Headache   . Hearing loss   . Hiatal hernia   . Hypertension   . Migraines   . Neuromuscular disorder ([place])    peripheral neuropathy  . Osteoporosis   . Reflux   . Shortness of breath dyspnea   . Sleep apnea    No longer has due to >70 lbs weight loss  . Vision loss     Surgical History: Past Surgical History:  Procedure Laterality Date  . DILATION AND CURETTAGE OF UTERUS    . ESOPHAGOGASTRODUODENOSCOPY (EGD) WITH PROPOFOL N/A 05/08/2017   Procedure: ESOPHAGOGASTRODUODENOSCOPY (EGD) WITH PROPOFOL;  Surgeon: Jonathon Bellows, MD;  Location: [facility] ENDOSCOPY;  Service: Gastroenterology;  Laterality: N/A;  . INNER EAR SURGERY Bilateral     Home Medications:  Allergies as of 03/30/2018      Reactions   Contrast Media [iodinated Diagnostic Agents] Swelling   Iodine    Other Hives, Other (See Comments)   Spider bite (has had two) each time caused blisters/whelps and redness and pt had to go to ER. She now carries Epipen for this reason. Spider bite (has had two) each time caused blisters/whelps and redness and pt had to go to ER. She now carries Epipen for this reason.   Sulfa Antibiotics Rash, Hives   Blisters, hive, itching      Medication List        Accurate as of 03/30/18  3:11 PM. Always use  your most recent med list.          acetaminophen 500 MG tablet Commonly known as:  TYLENOL Take 1,000 mg by mouth every 6 (six) hours as needed.   albuterol-ipratropium 18-103 MCG/ACT inhaler Commonly known as:  COMBIVENT Inhale into the lungs every 6 (six) hours as needed for wheezing or shortness of breath.   cholecalciferol 1000 units tablet Commonly known as:  VITAMIN D Take 1,000 Units by mouth daily.   cyclobenzaprine 10 MG tablet Commonly known as:  FLEXERIL Take 1 tablet (10 mg total) by mouth 3 (three) times daily as needed for muscle spasms. Start taking on:  03/31/2018   furosemide 20 MG tablet Commonly known as:   LASIX Take 1 tablet (20 mg total) by mouth daily.   gabapentin 300 MG capsule Commonly known as:  NEURONTIN Take 1-2 capsules (300-600 mg total) by mouth 3 (three) times daily. Follow written titration schedule. Start taking on:  03/31/2018   KLOR-CON 10 10 MEQ tablet Generic drug:  potassium chloride TAKE 1 BY MOUTH DAILY FOR LOW POTASSIUM   lisinopril 40 MG tablet Commonly known as:  PRINIVIL,ZESTRIL Take 40 mg by mouth daily.   meloxicam 15 MG tablet Commonly known as:  MOBIC Take 1 tablet (15 mg total) by mouth daily.   multivitamin capsule Take 1 capsule by mouth daily.   MYRBETRIQ 25 MG Tb24 tablet Generic drug:  mirabegron ER Take 25 mg by mouth daily.   ofloxacin 0.3 % OTIC solution Commonly known as:  FLOXIN   oxyCODONE 5 MG immediate release tablet Commonly known as:  Oxy IR/ROXICODONE Take 1 tablet (5 mg total) by mouth every 6 (six) hours as needed for severe pain. Start taking on:  03/31/2018   oxyCODONE 5 MG immediate release tablet Commonly known as:  Oxy IR/ROXICODONE Take 1 tablet (5 mg total) by mouth every 6 (six) hours as needed for severe pain. Start taking on:  04/30/2018   oxyCODONE 5 MG immediate release tablet Commonly known as:  Oxy IR/ROXICODONE Take 1 tablet (5 mg total) by mouth every 6 (six) hours as needed for severe pain. Start taking on:  05/30/2018   pantoprazole 40 MG tablet Commonly known as:  PROTONIX Take 40 mg by mouth 2 (two) times daily.   traZODone 100 MG tablet Commonly known as:  DESYREL Take 1 tablet (100 mg total) by mouth at bedtime.   zolpidem 10 MG tablet Commonly known as:  AMBIEN Take 10 mg by mouth at bedtime as needed for sleep.       Allergies:  Allergies  Allergen Reactions  . Contrast Media [Iodinated Diagnostic Agents] Swelling  . Iodine   . Other Hives and Other (See Comments)    Spider bite (has had two) each time caused blisters/whelps and redness and pt had to go to ER. She now carries  Epipen for this reason. Spider bite (has had two) each time caused blisters/whelps and redness and pt had to go to ER. She now carries Epipen for this reason.  . Sulfa Antibiotics Rash and Hives    Blisters, hive, itching    Family History: Family History  Problem Relation Age of Onset  . Diabetes Mother   . Heart disease Mother   . Kidney disease Mother   . COPD Father   . Breast cancer Paternal Aunt 61    Social History:  reports that she quit smoking about 20 years ago. She has never used smokeless tobacco. She reports that she does  not drink alcohol or use drugs.  ROS: UROLOGY Frequent Urination?: Yes Hard to postpone urination?: Yes Burning/pain with urination?: No Get up at night to urinate?: Yes Leakage of urine?: Yes Urine stream starts and stops?: Yes Trouble starting stream?: No Do you have to strain to urinate?: No Blood in urine?: No Urinary tract infection?: No Sexually transmitted disease?: No Injury to kidneys or bladder?: Yes Painful intercourse?: No Weak stream?: No Currently pregnant?: No Vaginal bleeding?: No Last menstrual period?: n  Gastrointestinal Nausea?: Yes Vomiting?: No Indigestion/heartburn?: Yes Diarrhea?: No Constipation?: No  Constitutional Fever: No Night sweats?: No Weight loss?: Yes Fatigue?: Yes  Skin Skin rash/lesions?: No Itching?: No  Eyes Blurred vision?: No Double vision?: No  Ears/Nose/Throat Sore throat?: No Sinus problems?: No  Hematologic/Lymphatic Swollen glands?: No Easy bruising?: Yes  Cardiovascular Leg swelling?: No Chest pain?: Yes  Respiratory Cough?: No Shortness of breath?: Yes  Endocrine Excessive thirst?: No  Musculoskeletal Back pain?: Yes Joint pain?: Yes  Neurological Headaches?: Yes Dizziness?: Yes  Psychologic Depression?: Yes Anxiety?: Yes  Physical Exam: BP 127/74   Pulse 80   LMP 08/23/2016   Constitutional:  Alert and oriented, No acute distress. HEENT: [place]  AT, moist mucus membranes.  Trachea midline, no masses. Cardiovascular: No clubbing, cyanosis, or edema. Respiratory: Normal respiratory effort, no increased work of breathing. GI: Abdomen is soft, nontender, nondistended, no abdominal masses GU: No CVA tenderness.  The patient had a small grade 1 cystocele and distal grade 1 rectocele.  She had a vaginal epithelial tissue elevation at the most caudal aspect of the cystocele near the trigone.  She had no diverticulum.  She had mild hypermobility the bladder neck and negative cough test Skin: No rashes, bruises or suspicious lesions. Lymph: No cervical or inguinal adenopathy. Neurologic: Grossly intact, no focal deficits, moving all 4 extremities. Psychiatric: Normal mood and affect.  Laboratory Data: Lab Results  Component Value Date   WBC 6.1 08/27/2015   HGB 13.6 08/27/2015   HCT 39.4 08/27/2015   MCV 84.6 08/27/2015   PLT 190 08/27/2015    Lab Results  Component Value Date   CREATININE 0.97 07/23/2016    No results found for: PSA  No results found for: TESTOSTERONE  No results found for: HGBA1C  Urinalysis    Component Value Date/Time   COLORURINE Red 03/18/2012 2137   APPEARANCEUR Turbid 03/18/2012 2137   LABSPEC 1.016 03/18/2012 2137   PHURINE 8.0 03/18/2012 2137   GLUCOSEU Negative 03/18/2012 2137   HGBUR 3+ 03/18/2012 2137   BILIRUBINUR Negative 03/18/2012 2137   KETONESUR Negative 03/18/2012 2137   PROTEINUR 100 mg/dL 03/18/2012 2137   NITRITE Negative 03/18/2012 2137   LEUKOCYTESUR 1+ 03/18/2012 2137    Pertinent Imaging:   Assessment & Plan: Patient has mixed incontinence.  She is improved on Myrbetriq.  She does drink a lot of water.  Role of urodynamics discussed.  From time to time she thinks she feels some pressure in the vagina.  I think was more suprapubic.  It is intermittent and mild.  I do not think she is symptomatic prolapse.  If she does have urodynamics we will reassess the size of the  cystocele.  She leaks with walking and wants to have urodynamics.  She will stay on the Myrbetriq.  1. Mixed stress and urge urinary incontinence  - Urinalysis, Complete   Return in about 4 weeks (around 04/27/2018) for UDS [place].  Reece Packer, MD  [facility] [address]  [place], [place] [address] [phone]

## 2018-03-31 ENCOUNTER — Ambulatory Visit: Payer: Medicare Other

## 2018-03-31 ENCOUNTER — Ambulatory Visit
Admission: RE | Admit: 2018-03-31 | Discharge: 2018-03-31 | Disposition: A | Discharge status: Home / Self Care | Payer: Medicare Other | Source: Ambulatory Visit | Attending: Otolaryngology | Admitting: Otolaryngology

## 2018-03-31 DIAGNOSIS — E041 Nontoxic single thyroid nodule: Secondary | ICD-10-CM | POA: Insufficient documentation

## 2018-03-31 LAB — URINALYSIS, COMPLETE
BILIRUBIN UA: NEGATIVE
Glucose, UA: NEGATIVE
Ketones, UA: NEGATIVE
Nitrite, UA: NEGATIVE
PH UA: 6.5 (ref 5.0–7.5)
PROTEIN UA: NEGATIVE
RBC, UA: NEGATIVE
Specific Gravity, UA: 1.01 (ref 1.005–1.030)
Urobilinogen, Ur: 0.2 mg/dL (ref 0.2–1.0)

## 2018-03-31 LAB — MICROSCOPIC EXAMINATION
BACTERIA UA: NONE SEEN
RBC, UA: NONE SEEN /hpf (ref 0–2)

## 2018-03-31 NOTE — Discharge Instructions (Signed)
Thyroid Biopsy, Care After °Refer to this sheet in the next few weeks. These instructions provide you with information on caring for yourself after your procedure. Your health care provider may also give you more specific instructions. Your treatment has been planned according to current medical practices, but problems sometimes occur. Call your health care provider if you have any problems or questions after your procedure. °What can I expect after the procedure? °After your procedure, it is typical to have the following: °· You may have soreness and tenderness at the biopsy site for a few days. °· You may have a sore throat or a hoarse voice if you had an open biopsy. This should go away after a couple days. ° °Follow these instructions at home: °· Take medicines only as directed by your health care provider. °· To ease discomfort at the biopsy site: °? Keep your head raised on a pillow when you are lying down. °? Support the back of your head and neck with both hands as you sit up from a lying position. °· If you have a sore throat, try using throat lozenges or gargling with warm salt water. °· Keep all follow-up visits as directed by your health care provider. This is important. °Contact a health care provider if: °· You have a fever. °Get help right away if: °· You have severe bleeding from the biopsy site. °· You have difficulty swallowing. °· You have drainage, redness, swelling, or pain at the biopsy site. °· You have swollen glands (lymph nodes) in your neck. °This information is not intended to replace advice given to you by your health care provider. Make sure you discuss any questions you have with your health care provider. °Document Released: 12/22/2013 Document Revised: 01/28/2016 Document Reviewed: 08/19/2013 °Elsevier Interactive Patient Education © 2018 [employer] ° °

## 2018-04-02 LAB — CYTOLOGY - NON PAP

## 2018-04-15 ENCOUNTER — Other Ambulatory Visit: Payer: Self-pay | Admitting: Nurse Practitioner

## 2018-04-15 DIAGNOSIS — Z1231 Encounter for screening mammogram for malignant neoplasm of breast: Secondary | ICD-10-CM

## 2018-05-21 ENCOUNTER — Other Ambulatory Visit: Payer: Self-pay | Admitting: Urology

## 2018-05-21 ENCOUNTER — Encounter
Admission: RE | Admit: 2018-05-21 | Discharge: 2018-05-21 | Disposition: A | Discharge status: Home / Self Care | Payer: Medicare Other | Source: Ambulatory Visit | Attending: Otolaryngology | Admitting: Otolaryngology

## 2018-05-21 ENCOUNTER — Other Ambulatory Visit: Payer: Self-pay

## 2018-05-21 DIAGNOSIS — Z01812 Encounter for preprocedural laboratory examination: Secondary | ICD-10-CM | POA: Insufficient documentation

## 2018-05-21 HISTORY — DX: Family history of other specified conditions: Z84.89

## 2018-05-21 LAB — POTASSIUM: POTASSIUM: 3.5 mmol/L (ref 3.5–5.1)

## 2018-05-21 NOTE — Patient Instructions (Signed)
Your procedure is scheduled on: 05/28/18 Report to Day Surgery. [facility] SECOND FLOOR To find out your arrival time please call [phone] between 1PM - 3PM on 05/27/18.  Remember: Instructions that are not followed completely may result in serious medical risk,  up to and including death, or upon the discretion of your surgeon and anesthesiologist your  surgery may need to be rescheduled.     _X__ 1. Do not eat food after midnight the night before your procedure.                 No gum chewing or hard candies. You may drink clear liquids up to 2 hours                 before you are scheduled to arrive for your surgery- DO not drink clear                 liquids within 2 hours of the start of your surgery.                 Clear Liquids include:  water, apple juice without pulp, clear carbohydrate                 drink such as Clearfast of Gatorade, Black Coffee or Tea (Do not add                 anything to coffee or tea).  __X__2.  On the morning of surgery brush your teeth with toothpaste and water, you                may rinse your mouth with mouthwash if you wish.  Do not swallow any toothpaste of mouthwash.     _X__ 3.  No Alcohol for 24 hours before or after surgery.   _X__ 4.  Do Not Smoke or use e-cigarettes For 24 Hours Prior to Your Surgery.                 Do not use any chewable tobacco products for at least 6 hours prior to                 surgery.  ____  5.  Bring all medications with you on the day of surgery if instructed.   ____  6.  Notify your doctor if there is any change in your medical condition      (cold, fever, infections).     Do not wear jewelry, make-up, hairpins, clips or nail polish. Do not wear lotions, powders, or perfumes. You may wear deodorant. Do not shave 48 hours prior to surgery. Men may shave face and neck. Do not bring valuables to the hospital.    [facility] is not responsible for any belongings or  valuables.  Contacts, dentures or bridgework may not be worn into surgery. Leave your suitcase in the car. After surgery it may be brought to your room. For patients admitted to the hospital, discharge time is determined by your treatment team.   Patients discharged the day of surgery will not be allowed to drive home.   Please read over the following fact sheets that you were given:   Surgical Site Infection Prevention          __X__ Take these medicines the morning of surgery with A SIP OF WATER:    1. MIRABYRON  2. GABAPENTIN  3. CYCLOBENZAPRINE  4. PEPCID  5.  6.  ____ Fleet Enema (as directed)  __X__ Use CHG Soap as directed  __X__ Use inhalers on the day of surgery   AND BRING TO HOSPITAL  ____ Stop metformin 2 days prior to surgery    ____ Take 1/2 of usual insulin dose the night before surgery. No insulin the morning          of surgery.   ____ Stop Coumadin/Plavix/aspirin on   ____ Stop Anti-inflammatories on  MELOXICAM TODAY UNTIL AFTER SURGERY   ____ Stop supplements until after surgery.    ____ Bring C-Pap to the hospital.

## 2018-05-25 ENCOUNTER — Ambulatory Visit: Payer: Medicare Other | Admitting: Urology

## 2018-05-27 ENCOUNTER — Encounter: Payer: Self-pay | Admitting: Podiatry

## 2018-05-27 ENCOUNTER — Ambulatory Visit (INDEPENDENT_AMBULATORY_CARE_PROVIDER_SITE_OTHER): Payer: Medicare Other | Admitting: Podiatry

## 2018-05-27 ENCOUNTER — Ambulatory Visit (INDEPENDENT_AMBULATORY_CARE_PROVIDER_SITE_OTHER): Payer: Medicare Other

## 2018-05-27 DIAGNOSIS — S9032XA Contusion of left foot, initial encounter: Secondary | ICD-10-CM | POA: Diagnosis not present

## 2018-05-27 NOTE — Progress Notes (Signed)
She presents today for left foot injury.  States that she is walking down the steps about 10 days ago tried to missed an Lawyer on a step twisted her foot.  Since that time the foot is been swollen and painful across the top with radiating pain.  States that she cannot really bend her toes well states they feel stiff she is tried oxycodone which she takes for her back and is really not touching the pain.  She would like injections today.  She is having thyroid surgery tomorrow.  Objective: Vital signs are stable alert and oriented x3.  Pulses are palpable.  Neurologic sensorium is intact.  Degenerative flexors are intact.  Muscle strength is normal symmetrical.  She has pain without edema to the dorsal aspect of the left foot mild tenderness on the base of the second metatarsal left.  Radiographs taken today demonstrate a possible fracture of the second metatarsal base non-comminuted nondisplaced.  Assessment: Cannot rule out a fracture of the second metatarsal base or contusion or sprain of the foot.  Plan: Placed her in a cam walker we will follow-up with her in 2 months at which time we may need to do injections at that time.

## 2018-05-28 ENCOUNTER — Ambulatory Visit: Payer: Medicare Other | Admitting: Certified Registered"

## 2018-05-28 ENCOUNTER — Inpatient Hospital Stay
Admission: RE | Admit: 2018-05-28 | Discharge: 2018-05-31 | DRG: 627 | Disposition: A | Discharge status: Home / Self Care | Payer: Medicare Other | Attending: Otolaryngology | Admitting: Otolaryngology

## 2018-05-28 ENCOUNTER — Encounter
Admission: RE | Disposition: A | Discharge status: Home / Self Care | Payer: Self-pay | Source: Home / Self Care | Attending: Otolaryngology

## 2018-05-28 ENCOUNTER — Other Ambulatory Visit: Payer: Self-pay | Admitting: Podiatry

## 2018-05-28 ENCOUNTER — Other Ambulatory Visit: Payer: Self-pay

## 2018-05-28 DIAGNOSIS — Z79891 Long term (current) use of opiate analgesic: Secondary | ICD-10-CM

## 2018-05-28 DIAGNOSIS — J45909 Unspecified asthma, uncomplicated: Secondary | ICD-10-CM | POA: Diagnosis present

## 2018-05-28 DIAGNOSIS — Z79899 Other long term (current) drug therapy: Secondary | ICD-10-CM

## 2018-05-28 DIAGNOSIS — Z9009 Acquired absence of other part of head and neck: Secondary | ICD-10-CM

## 2018-05-28 DIAGNOSIS — Z9889 Other specified postprocedural states: Secondary | ICD-10-CM

## 2018-05-28 DIAGNOSIS — E039 Hypothyroidism, unspecified: Secondary | ICD-10-CM | POA: Diagnosis present

## 2018-05-28 DIAGNOSIS — Z6839 Body mass index (BMI) 39.0-39.9, adult: Secondary | ICD-10-CM

## 2018-05-28 DIAGNOSIS — D440 Neoplasm of uncertain behavior of thyroid gland: Principal | ICD-10-CM | POA: Diagnosis present

## 2018-05-28 DIAGNOSIS — G894 Chronic pain syndrome: Secondary | ICD-10-CM | POA: Diagnosis present

## 2018-05-28 DIAGNOSIS — Z9089 Acquired absence of other organs: Secondary | ICD-10-CM

## 2018-05-28 DIAGNOSIS — K449 Diaphragmatic hernia without obstruction or gangrene: Secondary | ICD-10-CM | POA: Diagnosis present

## 2018-05-28 DIAGNOSIS — G4730 Sleep apnea, unspecified: Secondary | ICD-10-CM | POA: Diagnosis present

## 2018-05-28 DIAGNOSIS — Z87891 Personal history of nicotine dependence: Secondary | ICD-10-CM

## 2018-05-28 DIAGNOSIS — M542 Cervicalgia: Secondary | ICD-10-CM | POA: Diagnosis present

## 2018-05-28 DIAGNOSIS — K219 Gastro-esophageal reflux disease without esophagitis: Secondary | ICD-10-CM | POA: Diagnosis present

## 2018-05-28 DIAGNOSIS — E890 Postprocedural hypothyroidism: Secondary | ICD-10-CM

## 2018-05-28 DIAGNOSIS — F418 Other specified anxiety disorders: Secondary | ICD-10-CM | POA: Diagnosis present

## 2018-05-28 HISTORY — PX: THYROIDECTOMY: SHX17

## 2018-05-28 LAB — CALCIUM
Calcium: 9.9 mg/dL (ref 8.9–10.3)
Calcium: 9.3 mg/dL (ref 8.9–10.3)

## 2018-05-28 LAB — ALBUMIN: Albumin: 4 g/dL (ref 3.5–5.0)

## 2018-05-28 LAB — MAGNESIUM: Magnesium: 2 mg/dL (ref 1.7–2.4)

## 2018-05-28 SURGERY — THYROIDECTOMY
Anesthesia: General

## 2018-05-28 MED ORDER — MELOXICAM 7.5 MG PO TABS
15.0000 mg | ORAL_TABLET | Freq: Every day | ORAL | Status: DC
  Administered 2018-05-29 – 2018-05-31 (×3): 15 mg via ORAL
  Filled 2018-05-28 (×4): qty 2

## 2018-05-28 MED ORDER — MIDAZOLAM HCL 2 MG/2ML IJ SOLN
INTRAMUSCULAR | Status: DC | PRN
  Administered 2018-05-28: 2 mg via INTRAVENOUS

## 2018-05-28 MED ORDER — ACETAMINOPHEN 160 MG/5ML PO SOLN
650.0000 mg | ORAL | Status: DC | PRN
  Administered 2018-05-29: 650 mg via ORAL
  Filled 2018-05-28 (×2): qty 20.3

## 2018-05-28 MED ORDER — MIDAZOLAM HCL 2 MG/2ML IJ SOLN
INTRAMUSCULAR | Status: AC
  Filled 2018-05-28: qty 2

## 2018-05-28 MED ORDER — ACETAMINOPHEN 10 MG/ML IV SOLN
INTRAVENOUS | Status: AC
  Administered 2018-05-28: 1000 mg via INTRAVENOUS
  Filled 2018-05-28: qty 100

## 2018-05-28 MED ORDER — BUPIVACAINE-EPINEPHRINE (PF) 0.25% -1:200000 IJ SOLN
INTRAMUSCULAR | Status: AC
  Filled 2018-05-28: qty 30

## 2018-05-28 MED ORDER — HYDROMORPHONE HCL 1 MG/ML IJ SOLN
0.2500 mg | INTRAMUSCULAR | Status: DC | PRN
  Administered 2018-05-28 (×3): 0.25 mg via INTRAVENOUS

## 2018-05-28 MED ORDER — PROPOFOL 10 MG/ML IV BOLUS
INTRAVENOUS | Status: AC
  Filled 2018-05-28: qty 20

## 2018-05-28 MED ORDER — ONDANSETRON HCL 4 MG/2ML IJ SOLN
INTRAMUSCULAR | Status: DC | PRN
  Administered 2018-05-28: 4 mg via INTRAVENOUS

## 2018-05-28 MED ORDER — LISINOPRIL-HYDROCHLOROTHIAZIDE 20-25 MG PO TABS: 1.0000 | ORAL_TABLET | Freq: Every day | ORAL | Status: DC

## 2018-05-28 MED ORDER — DEXAMETHASONE SODIUM PHOSPHATE 10 MG/ML IJ SOLN
INTRAMUSCULAR | Status: DC | PRN
  Administered 2018-05-28 (×2): 5 mg via INTRAVENOUS

## 2018-05-28 MED ORDER — MOMETASONE FURO-FORMOTEROL FUM 200-5 MCG/ACT IN AERO
2.0000 | INHALATION_SPRAY | Freq: Two times a day (BID) | RESPIRATORY_TRACT | Status: DC
  Administered 2018-05-28 – 2018-05-31 (×6): 2 via RESPIRATORY_TRACT
  Filled 2018-05-28: qty 8.8

## 2018-05-28 MED ORDER — MIRABEGRON ER 50 MG PO TB24
50.0000 mg | ORAL_TABLET | Freq: Every day | ORAL | Status: DC
  Administered 2018-05-29 – 2018-05-31 (×3): 50 mg via ORAL
  Filled 2018-05-28 (×3): qty 1

## 2018-05-28 MED ORDER — SODIUM CHLORIDE 0.9 % IV SOLN
INTRAVENOUS | Status: DC | PRN
  Administered 2018-05-28: 50 ug/min via INTRAVENOUS

## 2018-05-28 MED ORDER — VASOPRESSIN 20 UNIT/ML IV SOLN
INTRAVENOUS | Status: DC | PRN
  Administered 2018-05-28: 1 [IU] via INTRAVENOUS
  Administered 2018-05-28: .5 [IU] via INTRAVENOUS

## 2018-05-28 MED ORDER — ACETAMINOPHEN 10 MG/ML IV SOLN
1000.0000 mg | Freq: Once | INTRAVENOUS | Status: AC
  Administered 2018-05-28: 1000 mg via INTRAVENOUS

## 2018-05-28 MED ORDER — LISINOPRIL 20 MG PO TABS
20.0000 mg | ORAL_TABLET | Freq: Every day | ORAL | Status: DC
  Administered 2018-05-28 – 2018-05-31 (×4): 20 mg via ORAL
  Filled 2018-05-28 (×4): qty 1

## 2018-05-28 MED ORDER — OXYCODONE HCL 5 MG/5ML PO SOLN
5.0000 mg | ORAL | Status: DC | PRN
  Administered 2018-05-28 – 2018-05-31 (×10): 5 mg via ORAL
  Filled 2018-05-28 (×10): qty 5

## 2018-05-28 MED ORDER — GABAPENTIN 300 MG PO CAPS
300.0000 mg | ORAL_CAPSULE | Freq: Three times a day (TID) | ORAL | Status: DC
  Administered 2018-05-28 – 2018-05-31 (×8): 300 mg via ORAL
  Filled 2018-05-28 (×8): qty 1

## 2018-05-28 MED ORDER — SUCCINYLCHOLINE CHLORIDE 20 MG/ML IJ SOLN
INTRAMUSCULAR | Status: AC
  Filled 2018-05-28: qty 1

## 2018-05-28 MED ORDER — PROPOFOL 10 MG/ML IV BOLUS
INTRAVENOUS | Status: DC | PRN
  Administered 2018-05-28: 160 mg via INTRAVENOUS

## 2018-05-28 MED ORDER — DEXAMETHASONE SODIUM PHOSPHATE 10 MG/ML IJ SOLN
INTRAMUSCULAR | Status: AC
  Filled 2018-05-28: qty 1

## 2018-05-28 MED ORDER — PROPOFOL 10 MG/ML IV BOLUS
INTRAVENOUS | Status: AC
  Filled 2018-05-28: qty 40

## 2018-05-28 MED ORDER — BUPIVACAINE-EPINEPHRINE (PF) 0.25% -1:200000 IJ SOLN
INTRAMUSCULAR | Status: DC | PRN
  Administered 2018-05-28: 7 mL

## 2018-05-28 MED ORDER — POLYVINYL ALCOHOL 1.4 % OP SOLN
1.0000 [drp] | Freq: Every day | OPHTHALMIC | Status: DC | PRN
  Filled 2018-05-28: qty 15

## 2018-05-28 MED ORDER — KETOROLAC TROMETHAMINE 30 MG/ML IJ SOLN
INTRAMUSCULAR | Status: AC
  Filled 2018-05-28: qty 1

## 2018-05-28 MED ORDER — FAMOTIDINE 20 MG PO TABS
40.0000 mg | ORAL_TABLET | Freq: Every day | ORAL | Status: DC
  Administered 2018-05-29 – 2018-05-31 (×3): 40 mg via ORAL
  Filled 2018-05-28 (×3): qty 2

## 2018-05-28 MED ORDER — CALCIUM CARBONATE 1250 (500 CA) MG PO TABS
1000.0000 mg | ORAL_TABLET | Freq: Three times a day (TID) | ORAL | Status: DC
  Administered 2018-05-28 – 2018-05-31 (×9): 1000 mg via ORAL
  Filled 2018-05-28 (×15): qty 2

## 2018-05-28 MED ORDER — PHENYLEPHRINE HCL 10 MG/ML IJ SOLN
INTRAMUSCULAR | Status: AC
  Filled 2018-05-28: qty 1

## 2018-05-28 MED ORDER — ACETAMINOPHEN 650 MG RE SUPP: 650.0000 mg | RECTAL | Status: DC | PRN

## 2018-05-28 MED ORDER — SUCCINYLCHOLINE CHLORIDE 20 MG/ML IJ SOLN
INTRAMUSCULAR | Status: DC | PRN
  Administered 2018-05-28: 100 mg via INTRAVENOUS

## 2018-05-28 MED ORDER — POTASSIUM CHLORIDE CRYS ER 10 MEQ PO TBCR
10.0000 meq | EXTENDED_RELEASE_TABLET | Freq: Every day | ORAL | Status: DC
  Administered 2018-05-29 – 2018-05-31 (×3): 10 meq via ORAL
  Filled 2018-05-28 (×3): qty 1

## 2018-05-28 MED ORDER — SENNOSIDES-DOCUSATE SODIUM 8.6-50 MG PO TABS: 1.0000 | ORAL_TABLET | Freq: Every evening | ORAL | Status: DC | PRN

## 2018-05-28 MED ORDER — ZOLPIDEM TARTRATE 5 MG PO TABS
5.0000 mg | ORAL_TABLET | Freq: Every evening | ORAL | Status: DC | PRN
  Administered 2018-05-28: 5 mg via ORAL
  Filled 2018-05-28: qty 1

## 2018-05-28 MED ORDER — ONDANSETRON HCL 4 MG/2ML IJ SOLN: 4.0000 mg | INTRAMUSCULAR | Status: DC | PRN

## 2018-05-28 MED ORDER — BACITRACIN ZINC 500 UNIT/GM EX OINT
TOPICAL_OINTMENT | CUTANEOUS | Status: AC
  Filled 2018-05-28: qty 28.35

## 2018-05-28 MED ORDER — HYDROCHLOROTHIAZIDE 25 MG PO TABS
25.0000 mg | ORAL_TABLET | Freq: Every day | ORAL | Status: DC
  Administered 2018-05-28 – 2018-05-31 (×4): 25 mg via ORAL
  Filled 2018-05-28 (×4): qty 1

## 2018-05-28 MED ORDER — FLUTICASONE PROPIONATE 50 MCG/ACT NA SUSP
2.0000 | Freq: Two times a day (BID) | NASAL | Status: DC
  Administered 2018-05-28 – 2018-05-31 (×6): 2 via NASAL
  Filled 2018-05-28: qty 16

## 2018-05-28 MED ORDER — LIDOCAINE HCL (CARDIAC) PF 100 MG/5ML IV SOSY
PREFILLED_SYRINGE | INTRAVENOUS | Status: DC | PRN
  Administered 2018-05-28: 100 mg via INTRAVENOUS

## 2018-05-28 MED ORDER — ONDANSETRON HCL 4 MG/2ML IJ SOLN
INTRAMUSCULAR | Status: AC
  Filled 2018-05-28: qty 2

## 2018-05-28 MED ORDER — PROPOFOL 500 MG/50ML IV EMUL
INTRAVENOUS | Status: DC | PRN
  Administered 2018-05-28: 30 ug/kg/min via INTRAVENOUS

## 2018-05-28 MED ORDER — FENTANYL CITRATE (PF) 100 MCG/2ML IJ SOLN
INTRAMUSCULAR | Status: DC | PRN
  Administered 2018-05-28: 50 ug via INTRAVENOUS
  Administered 2018-05-28: 100 ug via INTRAVENOUS
  Administered 2018-05-28: 50 ug via INTRAVENOUS

## 2018-05-28 MED ORDER — FENTANYL CITRATE (PF) 250 MCG/5ML IJ SOLN
INTRAMUSCULAR | Status: AC
  Filled 2018-05-28: qty 5

## 2018-05-28 MED ORDER — MORPHINE SULFATE (PF) 2 MG/ML IV SOLN
2.0000 mg | INTRAVENOUS | Status: DC | PRN
  Administered 2018-05-28 – 2018-05-29 (×2): 2 mg via INTRAVENOUS
  Filled 2018-05-28 (×2): qty 1

## 2018-05-28 MED ORDER — LIDOCAINE HCL (PF) 2 % IJ SOLN
INTRAMUSCULAR | Status: AC
  Filled 2018-05-28: qty 10

## 2018-05-28 MED ORDER — ONDANSETRON HCL 4 MG PO TABS: 4.0000 mg | ORAL_TABLET | ORAL | Status: DC | PRN

## 2018-05-28 MED ORDER — PHENYLEPHRINE HCL 10 MG/ML IJ SOLN
INTRAMUSCULAR | Status: DC | PRN
  Administered 2018-05-28 (×3): 100 ug via INTRAVENOUS
  Administered 2018-05-28 (×2): 200 ug via INTRAVENOUS
  Administered 2018-05-28: 100 ug via INTRAVENOUS
  Administered 2018-05-28: 200 ug via INTRAVENOUS

## 2018-05-28 MED ORDER — LACTATED RINGERS IV SOLN
INTRAVENOUS | Status: DC
  Administered 2018-05-28: 07:00:00 via INTRAVENOUS

## 2018-05-28 MED ORDER — HYDROMORPHONE HCL 1 MG/ML IJ SOLN
INTRAMUSCULAR | Status: AC
  Administered 2018-05-28: 0.25 mg via INTRAVENOUS
  Filled 2018-05-28: qty 1

## 2018-05-28 MED ORDER — DOXEPIN HCL 10 MG PO CAPS
10.0000 mg | ORAL_CAPSULE | Freq: Every day | ORAL | Status: DC
  Administered 2018-05-28 – 2018-05-30 (×3): 20 mg via ORAL
  Filled 2018-05-28 (×4): qty 2

## 2018-05-28 MED ORDER — DEXTROSE-NACL 5-0.45 % IV SOLN
INTRAVENOUS | Status: DC
  Administered 2018-05-28: 14:00:00 via INTRAVENOUS

## 2018-05-28 MED ORDER — FUROSEMIDE 20 MG PO TABS
20.0000 mg | ORAL_TABLET | Freq: Every day | ORAL | Status: DC
  Administered 2018-05-29 – 2018-05-31 (×3): 20 mg via ORAL
  Filled 2018-05-28 (×2): qty 1

## 2018-05-28 SURGICAL SUPPLY — 40 items
BLADE SURG 15 STRL LF DISP TIS (BLADE) ×1 IMPLANT
BLADE SURG 15 STRL SS (BLADE) ×2
CANISTER SUCT 1200ML W/VALVE (MISCELLANEOUS) ×3 IMPLANT
CLOSURE WOUND 1/4X4 (GAUZE/BANDAGES/DRESSINGS)
CORD BIP STRL DISP 12FT (MISCELLANEOUS) ×3 IMPLANT
COVER WAND RF STERILE (DRAPES) ×1 IMPLANT
DERMABOND ADVANCED (GAUZE/BANDAGES/DRESSINGS)
DERMABOND ADVANCED .7 DNX12 (GAUZE/BANDAGES/DRESSINGS) IMPLANT
DRAIN TLS ROUND 10FR (DRAIN) ×2 IMPLANT
DRAPE MAG INST 16X20 L/F (DRAPES) ×3 IMPLANT
DRSG TEGADERM 2-3/8X2-3/4 SM (GAUZE/BANDAGES/DRESSINGS) IMPLANT
ELECT LARYNGEAL 6/7 (MISCELLANEOUS)
ELECT LARYNGEAL 8/9 (MISCELLANEOUS) ×3
ELECT REM PT RETURN 9FT ADLT (ELECTROSURGICAL) ×3
ELECTRODE LARYNGEAL 6/7 (MISCELLANEOUS) IMPLANT
ELECTRODE LARYNGEAL 8/9 (MISCELLANEOUS) IMPLANT
ELECTRODE REM PT RTRN 9FT ADLT (ELECTROSURGICAL) ×1 IMPLANT
FORCEPS JEWEL BIP 4-3/4 STR (INSTRUMENTS) ×3 IMPLANT
GAUZE 4X4 16PLY RFD (DISPOSABLE) IMPLANT
GLOVE BIO SURGEON STRL SZ7 (GLOVE) ×3 IMPLANT
GLOVE BIO SURGEON STRL SZ7.5 (GLOVE) ×3 IMPLANT
GOWN STRL REUS W/ TWL LRG LVL3 (GOWN DISPOSABLE) ×3 IMPLANT
GOWN STRL REUS W/TWL LRG LVL3 (GOWN DISPOSABLE) ×6
HEMOSTAT SURGICEL 2X3 (HEMOSTASIS) ×3 IMPLANT
HOOK STAY BLUNT/RETRACTOR 5M (MISCELLANEOUS) ×3 IMPLANT
KIT TURNOVER KIT A (KITS) ×3 IMPLANT
LABEL OR SOLS (LABEL) ×3 IMPLANT
NS IRRIG 500ML POUR BTL (IV SOLUTION) ×3 IMPLANT
PACK HEAD/NECK (MISCELLANEOUS) ×3 IMPLANT
PROBE NEUROSIGN BIPOL (MISCELLANEOUS) ×1 IMPLANT
PROBE NEUROSIGN BIPOLAR (MISCELLANEOUS) ×2
SHEARS HARMONIC 9CM CVD (BLADE) ×3 IMPLANT
SPONGE KITTNER 5P (MISCELLANEOUS) ×7 IMPLANT
STRIP CLOSURE SKIN 1/4X4 (GAUZE/BANDAGES/DRESSINGS) IMPLANT
SUT PROLENE 6 0 P 1 18 (SUTURE) ×3 IMPLANT
SUT SILK 2 0 (SUTURE) ×2
SUT SILK 2 0 SH (SUTURE) ×3 IMPLANT
SUT SILK 2-0 18XBRD TIE 12 (SUTURE) ×1 IMPLANT
SUT VIC AB 4-0 RB1 18 (SUTURE) ×3 IMPLANT
SYSTEM CHEST DRAIN TLS 7FR (DRAIN) IMPLANT

## 2018-05-28 NOTE — Progress Notes (Signed)
Pt having severe pain tylenol given

## 2018-05-28 NOTE — Anesthesia Procedure Notes (Signed)
Procedure Name: Intubation Date/Time: 05/28/2018 7:37 AM Performed by: Chanetta Marshall, CRNA Pre-anesthesia Checklist: Patient identified, Emergency Drugs available, Suction available and Patient being monitored Patient Re-evaluated:Patient Re-evaluated prior to induction Oxygen Delivery Method: Circle system utilized Preoxygenation: Pre-oxygenation with 100% oxygen Induction Type: IV induction Ventilation: Mask ventilation without difficulty Laryngoscope Size: McGraph (McGrath used for assistance in placement of laryngeal nerve monitor device) Grade View: Grade I Tube type: Oral Tube size: 7.0 mm Number of attempts: 1 Airway Equipment and Method: Stylet and Video-laryngoscopy Placement Confirmation: ETT inserted through vocal cords under direct vision,  positive ETCO2,  CO2 detector and breath sounds checked- equal and bilateral Secured at: 21 cm Tube secured with: Tape Dental Injury: Teeth and Oropharynx as per pre-operative assessment

## 2018-05-28 NOTE — H&P (Signed)
..  History and Physical paper copy reviewed and updated date of procedure and will be scanned into system.  Patient seen and examined.  

## 2018-05-28 NOTE — Transfer of Care (Signed)
Immediate Anesthesia Transfer of Care Note  Patient: Kristina Gomez  Procedure(s) Performed: TOTAL THYROIDECTOMY WITH LARYNGEAL NERVE MONITORING (N/A )  Patient Location: PACU  Anesthesia Type:General  Level of Consciousness: awake  Airway & Oxygen Therapy: Patient Spontanous Breathing and Patient connected to face mask oxygen  Post-op Assessment: Report given to RN and Post -op Vital signs reviewed and stable  Post vital signs: Reviewed and stable  Last Vitals:  Vitals Value Taken Time  BP    Temp    Pulse    Resp    SpO2      Last Pain:  Vitals:   05/28/18 0708  TempSrc: Oral         Complications: No apparent anesthesia complications

## 2018-05-28 NOTE — Anesthesia Preprocedure Evaluation (Addendum)
Anesthesia Evaluation  Patient identified by MRN, date of birth, ID band Patient awake    Reviewed: Allergy & Precautions, H&P , NPO status , Patient's Chart, lab work & pertinent test results  Airway Mallampati: III       Dental  (+) Poor Dentition, Missing   Pulmonary asthma , sleep apnea , former smoker,           Cardiovascular hypertension, negative cardio ROS       Neuro/Psych  Headaches, PSYCHIATRIC DISORDERS Anxiety Depression    GI/Hepatic Neg liver ROS, hiatal hernia, GERD  ,  Endo/Other  Hypothyroidism Morbid obesity  Renal/GU      Musculoskeletal  (+) Arthritis , Fibromyalgia -  Abdominal   Peds  Hematology negative hematology ROS (+)   Anesthesia Other Findings Past Medical History: No date: Anxiety No date: Arthritis No date: Asthma No date: Chronic pain 04/10/2015: Chronic pain syndrome No date: CPAP (continuous positive airway pressure) dependence     Comment:  no longer wears/ wt loss 04/10/2015: Degenerative arthritis of hip No date: Depression 04/10/2015: Diaphragmatic hernia No date: Family history of adverse reaction to anesthesia     Comment:  siater required medicine to wake up 09/27/2017: Food poisoning     Comment:  [facility] in [place], [place] No date: GERD (gastroesophageal reflux disease) No date: Headache No date: Hearing loss No date: Hiatal hernia No date: Hypertension No date: Migraines No date: Neuromuscular disorder ([place])     Comment:  peripheral neuropathy No date: Osteoporosis No date: Reflux No date: Shortness of breath dyspnea No date: Sleep apnea     Comment:  No longer has due to >70 lbs weight loss No date: Vision loss  Past Surgical History: No date: DILATION AND CURETTAGE OF UTERUS 05/08/2017: ESOPHAGOGASTRODUODENOSCOPY (EGD) WITH PROPOFOL; N/A     Comment:  Procedure: ESOPHAGOGASTRODUODENOSCOPY (EGD) WITH               PROPOFOL;   Surgeon: Jonathon Bellows, MD;  Location: [facility]               ENDOSCOPY;  Service: Gastroenterology;  Laterality: N/A; No date: INNER EAR SURGERY; Bilateral  BMI    Body Mass Index:  39.12 kg/m      Reproductive/Obstetrics negative OB ROS                            Anesthesia Physical Anesthesia Plan  ASA: III  Anesthesia Plan: General ETT   Post-op Pain Management:    Induction:   PONV Risk Score and Plan: Ondansetron, Dexamethasone, Midazolam and Treatment may vary due to age or medical condition  Airway Management Planned:   Additional Equipment:   Intra-op Plan:   Post-operative Plan:   Informed Consent: I have reviewed the patients History and Physical, chart, labs and discussed the procedure including the risks, benefits and alternatives for the proposed anesthesia with the patient or authorized representative who has indicated his/her understanding and acceptance.   Dental Advisory Given  Plan Discussed with: Anesthesiologist, CRNA and Surgeon  Anesthesia Plan Comments:         Anesthesia Quick Evaluation

## 2018-05-28 NOTE — Anesthesia Post-op Follow-up Note (Signed)
Anesthesia QCDR form completed.        

## 2018-05-28 NOTE — Progress Notes (Signed)
Per MD okay for RN to DC iv fluids.  

## 2018-05-28 NOTE — Op Note (Signed)
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  Exophytic right parathyroid mass removed.  Left superior parathyroid gland not identified.  Very firm and scarred thyroid to overlying musculature as well as to trachea bilaterally in areas of Berry's ligament.  Large and friable thyroid tissue.   Description of Procedure: After the patient was identified in hold and the history and physical and consent was reviewed and updated. The patient was marked in an upright position on the anterior neck along a natural occuring skin crease. The patient was next taken to the operating room and placed in a supine position. General endotracheal anesthesia was induced with laryngeal monitor endotracheal tube.  Direct visualization by the surgeon of the tube electrodes in contact with the vocal cords was made.. The patient's anterior marked neck crease was neck injected with 7cc's of 0.25% marcaine with 1:200,000 Epinephrine. The patient was next prepped and draped in a sterile normal fashion.  At this time, a 15 blade scalpel was used to make a skin incision along a previously marked anterior neck crease. Dissection was carefully performed through the subcutaneous tissues with combination of Bovie electrocautery and blunt dissection.  The platysma was incised and anterior neck veins ligated with harmonic scalpel.  The median raphe of the strap muscles was divided in a linear fashion with Bovie electrocautery until the anterior border of the thyroid gland  was identified.     Attention at this time was directed to the patient's left side. The sternohyoid muscle was bluntly dissected away from the large left thyroid gland.  The lateral border of the thyroid and the carotid artery was identified.  Dissection bluntly and with Bovie electrocautery was continued superiorly and inferiorly along the lateral edge of the thyroid.  The superior vessels were identified laterally and then medially with blunt dissection in Joel's space between the larynx and the superior thyroid pole.  Further dissection was continued inferiorly as well.  Once the superior pole was pedicled, the superior thyroid vessels were ligated with Harmonic Scalpel.  The large left thyroid was delivered from the wound.  The large left thyroid nodule once delivered from the wound revealed Berry's ligament and the nodule of Zuckerkandl.  Just beneath this nodule, the recurrent laryngeal nerve was identified and stimulated robustly with movement of the arytenoid joint and activation of the nerve stimulator.  The inferior parathyroid was identified adjacent to the nerve.  The nerve was tracked until its insertion into the larynx.  Next, the remaining attachments of Berry's ligament was divided and the left thyroid gland was completed  Attention at this time was directed to the patient's right side. The sternohyoid muscle was bluntly dissected away from the right hemithyroid.  The lateral border of the thyroid and the carotid artery was identified.  Dissection bluntly and with Bovie electrocautery was continued superiorly and inferiorly along the lateral edge of the thyroid.  This demonstrated a large firm mass encompassing majority of the lateral and superior aspect of the right hemithyroid.  Attempted were made to begin superiorly, but inferior dissection was needed initially due to firm nature of the thyroid and adherence  to the trachea.  The inferior aspect was seperated from the trachea.  An inferior  parathyroid gland was identified and preserved.  An exophytic mass near the area of the superior parathyroid was identified.  The fat cap and tan tissue consistent with parathyroid tissue was separated from the exophytic mass and sent as separate specimen.  A large vein was encountered inferiorly and hemostasis was acquired with suture ligature.  The nodule of Zuckerkandl was identified and mobilized and just beneath this, the right recurrent laryngeal nerve was identified.  This was traced into the insertion into her larynx.  At this time, the superior vessels were identified laterally and then medially with blunt dissection in Joel's space between the larynx and the superior thyroid pole.  Further dissection was continued inferiorly as well.  Once the superior pole was pedicled, the superior thyroid vessels were ligated with Harmonic Scalpel.  The large right hemithyroid was delivered from the wound after further dissection inferior and superiorly.   Once delivered from the wound revealed Berry's ligament and the nodule of Zuckerkandl again and again, just beneath this nodule, the recurrent laryngeal nerve was identified and stimulated robustly with movement of the arytenoid joint.  The previously identified inferior parathyroid was adjacent to the nerve along with the superior parathyroid as well.  These were identified with good vascularization.  The nerve was tracked until its insertion into the larynx.  Next, the remaining attachments of Berry's ligament was divided and the right hemithyroid gland was completed.  There remaining attachments of the total thyroid were separated from the larynx using bipolar and harmonic scalpel.  The gland was marked on the superior pole of the patient's right thyroid gland with a marking stitch and passed off the table for permanent evaluation.  The wound was copiously irrigated with sterile saline. Meticulous hemostasis with bipolar was obtained.  Visualization of an  intact left and right recurrent laryngeal nerves was made and these stimulated robustly.  The parathyroid glands were intact and well perfused except for the left superior parathyroid gland which was not identified.  The straps were closed in a single figure of 8 stitch after placement of TLS drain. The wound was then closed in a multilayered fashion with vicryl for subcutaneous tissues and Dermabond for the skin closure.  This was topped with steri-strip  At this time the patient was extubated and taken to PACU in good condition.  Plan: Admit for observation.  Follow pathology.  Limit activity for 2 weeks. Follow up next week for post-operative evaluation and suture removal.  Follow calcium levels.  Calcium taper.  Dell Hurtubise  05/28/2018 10:46 AM

## 2018-05-29 LAB — CALCIUM
Calcium: 8.8 mg/dL — ABNORMAL LOW (ref 8.9–10.3)
Calcium: 8.3 mg/dL — ABNORMAL LOW (ref 8.9–10.3)

## 2018-05-29 MED ORDER — CALCIUM CARBONATE-VITAMIN D 500-200 MG-UNIT PO TABS: 2.0000 | ORAL_TABLET | Freq: Three times a day (TID) | ORAL | 3 refills | Status: AC

## 2018-05-29 MED ORDER — OXYCODONE HCL 5 MG/5ML PO SOLN: 10.0000 mg | ORAL | 0 refills | Status: DC | PRN

## 2018-05-29 MED ORDER — ONDANSETRON HCL 4 MG PO TABS: 4.0000 mg | ORAL_TABLET | Freq: Three times a day (TID) | ORAL | 0 refills | Status: AC | PRN

## 2018-05-29 MED ORDER — SENNOSIDES-DOCUSATE SODIUM 8.6-50 MG PO TABS: 1.0000 | ORAL_TABLET | Freq: Every evening | ORAL | 0 refills | Status: AC | PRN

## 2018-05-29 NOTE — Progress Notes (Signed)
..   05/29/2018 4:38 PM  Kristina Gomez [id number]  Post-Op Day 1    Temp:  [98.3 F (36.8 C)-98.7 F (37.1 C)] 98.7 F (37.1 C) (12/20 1120) Pulse Rate:  [72-87] 87 (12/20 1120) Resp:  [20] 20 (12/20 0447) BP: (106-135)/(44-93) 106/44 (12/20 1120) SpO2:  [95 %-100 %] 95 % (12/20 1120),     Intake/Output Summary (Last 24 hours) at 05/29/2018 1638 Last data filed at 05/29/2018 1300 Gross per 24 hour  Intake 1239.95 ml  Output 2532 ml  Net -1292.05 ml    Results for orders placed or performed during the hospital encounter of 05/28/18 (from the past 24 hour(s))  Calcium     Status: None   Collection Time: 05/28/18  7:58 PM  Result Value Ref Range   Calcium 9.3 8.9 - 10.3 mg/dL  Calcium     Status: Abnormal   Collection Time: 05/29/18  3:22 AM  Result Value Ref Range   Calcium 8.8 (L) 8.9 - 10.3 mg/dL  Calcium     Status: Abnormal   Collection Time: 05/29/18  1:18 PM  Result Value Ref Range   Calcium 8.3 (L) 8.9 - 10.3 mg/dL    SUBJECTIVE:  Patient feeling poorly.  Some drainage from drain site.  OBJECTIVE:  GEN-  NAD NECK-  Mild edema and bruising, no seroma  IMPRESSION:  S/p total thyroidectomy with mild hypocalcemia  PLAN:  Patient's afternoon calcium continued to drop slightly to 8.3.  Will plan on rechecking albumin, magnesium, and calcium in morning after 1 hour breakfast dose of Oscal-D  Kristina Gomez 05/29/2018, 4:38 PM

## 2018-05-29 NOTE — Anesthesia Postprocedure Evaluation (Signed)
Anesthesia Post Note  Patient: Kristina Gomez  Procedure(s) Performed: TOTAL THYROIDECTOMY WITH LARYNGEAL NERVE MONITORING (N/A )  Patient location during evaluation: PACU Anesthesia Type: General Level of consciousness: awake and alert Pain management: pain level controlled Vital Signs Assessment: post-procedure vital signs reviewed and stable Respiratory status: spontaneous breathing, nonlabored ventilation, respiratory function stable and patient connected to nasal cannula oxygen Cardiovascular status: blood pressure returned to baseline and stable Postop Assessment: no apparent nausea or vomiting Anesthetic complications: no     Last Vitals:  Vitals:   05/29/18 0827 05/29/18 1120  BP: 113/61 (!) 106/44  Pulse: 72 87  Resp:    Temp:  37.1 C  SpO2:  95%    Last Pain:  Vitals:   05/29/18 1825  TempSrc:   PainSc: [name]

## 2018-05-29 NOTE — Progress Notes (Signed)
..   05/29/2018 8:19 AM  Elita Boone [id number]  Post-Op Day 1    Temp:  [97.5 F (36.4 C)-98.3 F (36.8 C)] 98.3 F (36.8 C) (12/19 2109) Pulse Rate:  [75-96] 75 (12/20 0447) Resp:  [0-30] 20 (12/20 0447) BP: (110-147)/(64-93) 110/64 (12/20 0447) SpO2:  [93 %-100 %] 95 % (12/20 0447),     Intake/Output Summary (Last 24 hours) at 05/29/2018 0819 Last data filed at 05/29/2018 0500 Gross per 24 hour  Intake 1764.98 ml  Output 2574 ml  Net -809.02 ml    Results for orders placed or performed during the hospital encounter of 05/28/18 (from the past 24 hour(s))  Magnesium     Status: None   Collection Time: 05/28/18 11:13 AM  Result Value Ref Range   Magnesium 2.0 1.7 - 2.4 mg/dL  Albumin     Status: None   Collection Time: 05/28/18 11:13 AM  Result Value Ref Range   Albumin 4.0 3.5 - 5.0 g/dL  Calcium     Status: None   Collection Time: 05/28/18 11:13 AM  Result Value Ref Range   Calcium 9.9 8.9 - 10.3 mg/dL  Calcium     Status: None   Collection Time: 05/28/18  7:58 PM  Result Value Ref Range   Calcium 9.3 8.9 - 10.3 mg/dL  Calcium     Status: Abnormal   Collection Time: 05/29/18  3:22 AM  Result Value Ref Range   Calcium 8.8 (L) 8.9 - 10.3 mg/dL    SUBJECTIVE:  No acute events.  Some neck pain last night.  Ambulating to bathroom.  Tolerating PO  OBJECTIVE:  GEN-  NAD NECK-  Mild swelling and edema, no hematoma, serosanguinous drain output with drain changes and no significant drainage, incision c/d/i  IMPRESSION:  S/p total thyroidectomy POD#1  PLAN:  Calcium continues to be above 8.0 but did trend down overnight.  Will check again today at 1p.m. to make sure it is stable.  If stable, then ok to discharge this afternoon with follow up on Monday or Tuesday of next week.  Tayte Mcwherter 05/29/2018, 8:19 AM

## 2018-05-29 NOTE — Care Management Obs Status (Signed)
[place] NOTIFICATION   Patient Details  Name: Kristina Gomez MRN: [id number] Date of Birth: 1966/02/22   Medicare Observation Status Notification Given:  No(admitted obs less than 24 hours)    Beverly Sessions, RN 05/29/2018, 9:33 AM

## 2018-05-30 DIAGNOSIS — E042 Nontoxic multinodular goiter: Secondary | ICD-10-CM | POA: Diagnosis present

## 2018-05-30 DIAGNOSIS — G894 Chronic pain syndrome: Secondary | ICD-10-CM | POA: Diagnosis present

## 2018-05-30 DIAGNOSIS — E039 Hypothyroidism, unspecified: Secondary | ICD-10-CM | POA: Diagnosis present

## 2018-05-30 DIAGNOSIS — G4730 Sleep apnea, unspecified: Secondary | ICD-10-CM | POA: Diagnosis present

## 2018-05-30 DIAGNOSIS — Z87891 Personal history of nicotine dependence: Secondary | ICD-10-CM | POA: Diagnosis not present

## 2018-05-30 DIAGNOSIS — Z79899 Other long term (current) drug therapy: Secondary | ICD-10-CM | POA: Diagnosis not present

## 2018-05-30 DIAGNOSIS — E890 Postprocedural hypothyroidism: Secondary | ICD-10-CM

## 2018-05-30 DIAGNOSIS — Z9009 Acquired absence of other part of head and neck: Secondary | ICD-10-CM

## 2018-05-30 DIAGNOSIS — Z9089 Acquired absence of other organs: Secondary | ICD-10-CM

## 2018-05-30 DIAGNOSIS — K219 Gastro-esophageal reflux disease without esophagitis: Secondary | ICD-10-CM | POA: Diagnosis present

## 2018-05-30 DIAGNOSIS — K449 Diaphragmatic hernia without obstruction or gangrene: Secondary | ICD-10-CM | POA: Diagnosis present

## 2018-05-30 DIAGNOSIS — M542 Cervicalgia: Secondary | ICD-10-CM | POA: Diagnosis present

## 2018-05-30 DIAGNOSIS — D440 Neoplasm of uncertain behavior of thyroid gland: Secondary | ICD-10-CM | POA: Diagnosis present

## 2018-05-30 DIAGNOSIS — F418 Other specified anxiety disorders: Secondary | ICD-10-CM | POA: Diagnosis present

## 2018-05-30 DIAGNOSIS — J45909 Unspecified asthma, uncomplicated: Secondary | ICD-10-CM | POA: Diagnosis present

## 2018-05-30 DIAGNOSIS — Z79891 Long term (current) use of opiate analgesic: Secondary | ICD-10-CM | POA: Diagnosis not present

## 2018-05-30 DIAGNOSIS — Z6839 Body mass index (BMI) 39.0-39.9, adult: Secondary | ICD-10-CM | POA: Diagnosis not present

## 2018-05-30 LAB — CALCIUM
Calcium: 7.5 mg/dL — ABNORMAL LOW (ref 8.9–10.3)
Calcium: 7.9 mg/dL — ABNORMAL LOW (ref 8.9–10.3)
Calcium: 8 mg/dL — ABNORMAL LOW (ref 8.9–10.3)

## 2018-05-30 LAB — MAGNESIUM: Magnesium: 2.1 mg/dL (ref 1.7–2.4)

## 2018-05-30 LAB — ALBUMIN: Albumin: 3.1 g/dL — ABNORMAL LOW (ref 3.5–5.0)

## 2018-05-30 MED ORDER — CALCIUM GLUCONATE-NACL 1-0.675 GM/50ML-% IV SOLN
1.0000 g | Freq: Once | INTRAVENOUS | Status: AC
  Administered 2018-05-30: 1000 mg via INTRAVENOUS
  Filled 2018-05-30: qty 50

## 2018-05-30 MED ORDER — HYDROXYZINE HCL 25 MG PO TABS
50.0000 mg | ORAL_TABLET | Freq: Four times a day (QID) | ORAL | Status: DC | PRN
  Administered 2018-05-30 (×2): 50 mg via ORAL
  Filled 2018-05-30 (×2): qty 2

## 2018-05-30 MED ORDER — CALCITRIOL 0.25 MCG PO CAPS
0.2500 ug | ORAL_CAPSULE | Freq: Every day | ORAL | Status: DC
  Administered 2018-05-30 – 2018-05-31 (×2): 0.25 ug via ORAL
  Filled 2018-05-30 (×2): qty 1

## 2018-05-30 NOTE — Progress Notes (Signed)
..   05/30/2018 10:04 AM  Kristina Gomez [id number]  Post-Op Day 2    Temp:  [98.1 F (36.7 C)-98.7 F (37.1 C)] 98.1 F (36.7 C) (12/21 0546) Pulse Rate:  [76-87] 76 (12/21 0546) Resp:  [16] 16 (12/21 0546) BP: (92-115)/(44-65) 92/52 (12/21 0546) SpO2:  [95 %-97 %] 96 % (12/21 0546),     Intake/Output Summary (Last 24 hours) at 05/30/2018 1004 Last data filed at 05/30/2018 0135 Gross per 24 hour  Intake 360 ml  Output 3000 ml  Net -2640 ml    Results for orders placed or performed during the hospital encounter of 05/28/18 (from the past 24 hour(s))  Calcium     Status: Abnormal   Collection Time: 05/29/18  1:18 PM  Result Value Ref Range   Calcium 8.3 (L) 8.9 - 10.3 mg/dL  Calcium     Status: Abnormal   Collection Time: 05/30/18  4:32 AM  Result Value Ref Range   Calcium 7.5 (L) 8.9 - 10.3 mg/dL  Albumin     Status: Abnormal   Collection Time: 05/30/18  4:32 AM  Result Value Ref Range   Albumin 3.1 (L) 3.5 - 5.0 g/dL  Magnesium     Status: None   Collection Time: 05/30/18  4:32 AM  Result Value Ref Range   Magnesium 2.1 1.7 - 2.4 mg/dL    SUBJECTIVE:  Patient reports feeling numbness and muscle spasms.  Calcium lower this morning.  Patient reports not sleeping well and requesting medication.  OBJECTIVE:  GEN- NAD NECK-  Incision c/d/i with expected post-operative swelling NEURO-  Negative chovstek's  IMPRESSION:  S/p Total thyroidectomy POD#2 with some post-operative hypocalcemia    PLAN:  Will replace with IV calcium given possible symptommatic hypocalcemia.  Begin rocaltrol as well.  Recheck at 1 p.m. and then again at 9 p.m. and tomorrow morning at 9a.m.  Possible discharge tomorrow depending on results.  [name] 05/30/2018, 10:04 AM

## 2018-05-31 LAB — CALCIUM
CALCIUM: 7.7 mg/dL — AB (ref 8.9–10.3)
Calcium: 7.7 mg/dL — ABNORMAL LOW (ref 8.9–10.3)

## 2018-05-31 LAB — ALBUMIN: Albumin: 3.4 g/dL — ABNORMAL LOW (ref 3.5–5.0)

## 2018-05-31 MED ORDER — CALCITRIOL 0.25 MCG PO CAPS: 0.2500 ug | ORAL_CAPSULE | Freq: Every day | ORAL | 6 refills | Status: AC

## 2018-05-31 MED ORDER — HYDROXYZINE HCL 50 MG PO TABS: 50.0000 mg | ORAL_TABLET | Freq: Four times a day (QID) | ORAL | 0 refills | Status: AC | PRN

## 2018-05-31 NOTE — Progress Notes (Signed)
..   05/31/2018 2:42 PM  Kristina Gomez [id number]  Post-Op Day 3    Temp:  [97.8 F (36.6 C)-98 F (36.7 C)] 97.8 F (36.6 C) (12/22 1300) Pulse Rate:  [69-75] 72 (12/22 1300) Resp:  [19-20] 19 (12/22 1300) BP: (98-125)/(57-74) 125/71 (12/22 1300) SpO2:  [95 %-98 %] 96 % (12/22 1300),     Intake/Output Summary (Last 24 hours) at 05/31/2018 1442 Last data filed at 05/31/2018 1410 Gross per 24 hour  Intake 240 ml  Output 3500 ml  Net -3260 ml    Results for orders placed or performed during the hospital encounter of 05/28/18 (from the past 24 hour(s))  Calcium     Status: Abnormal   Collection Time: 05/30/18  8:32 PM  Result Value Ref Range   Calcium 8.0 (L) 8.9 - 10.3 mg/dL  Calcium     Status: Abnormal   Collection Time: 05/31/18  9:05 AM  Result Value Ref Range   Calcium 7.7 (L) 8.9 - 10.3 mg/dL  Albumin     Status: Abnormal   Collection Time: 05/31/18  9:05 AM  Result Value Ref Range   Albumin 3.4 (L) 3.5 - 5.0 g/dL    SUBJECTIVE:  No acute events.  Slept better last night.  Tolerating diet and tolerating medications.  Went for a walk this afternoon.  OBJECTIVE:  GEN-  NAD NECK-  Incision c/d/i with no significant hematoma or seroma  IMPRESSION:  S/p total thyroidectomy POD#3 with post-operative hypocalcemia  PLAN:  Calcium 7.7 can corrects to 8.2 which is similar to yesterday morning with 7.5 correcting to 8.2.  Will see what this afternoon's lab is like.  If it continues to be low but stable, ok to discharge on current dosage of 1000mg  elemental calcium TID with Rocaltrol 0.72mcg.  If continues to decrease, will increase rocaltrol to BID and watch overnight.  Kristina Gomez 05/31/2018, 2:42 PM

## 2018-06-02 ENCOUNTER — Other Ambulatory Visit
Admission: RE | Admit: 2018-06-02 | Discharge: 2018-06-02 | Disposition: A | Discharge status: Home / Self Care | Payer: Medicare Other | Source: Ambulatory Visit | Attending: Otolaryngology | Admitting: Otolaryngology

## 2018-06-02 ENCOUNTER — Other Ambulatory Visit: Payer: Self-pay | Admitting: Pathology

## 2018-06-02 DIAGNOSIS — E041 Nontoxic single thyroid nodule: Secondary | ICD-10-CM | POA: Insufficient documentation

## 2018-06-02 DIAGNOSIS — D440 Neoplasm of uncertain behavior of thyroid gland: Secondary | ICD-10-CM | POA: Diagnosis not present

## 2018-06-02 LAB — SURGICAL PATHOLOGY

## 2018-06-02 LAB — CALCIUM: Calcium: 7.2 mg/dL — ABNORMAL LOW (ref 8.9–10.3)

## 2018-06-02 LAB — ALBUMIN: Albumin: 3.7 g/dL (ref 3.5–5.0)

## 2018-06-08 ENCOUNTER — Other Ambulatory Visit
Admission: RE | Admit: 2018-06-08 | Discharge: 2018-06-08 | Disposition: A | Discharge status: Home / Self Care | Payer: Medicare Other | Source: Ambulatory Visit | Attending: Otolaryngology | Admitting: Otolaryngology

## 2018-06-08 DIAGNOSIS — Z483 Aftercare following surgery for neoplasm: Secondary | ICD-10-CM | POA: Insufficient documentation

## 2018-06-08 LAB — CALCIUM: Calcium: 8.6 mg/dL — ABNORMAL LOW (ref 8.9–10.3)

## 2018-06-08 LAB — TSH: TSH: 3.309 u[IU]/mL (ref 0.350–4.500)

## 2018-06-11 ENCOUNTER — Other Ambulatory Visit: Payer: Medicare Other

## 2018-06-11 NOTE — Discharge Summary (Signed)
Date of Admission:  05/28/2018  Date of Discharge:  05/31/2018  Admitting diagnosis:  S/P Total thryoidectomy  Hospital Course:  Patient underwent total thyroidectomy on 06/07/2018 that was uneventful.  Following the procedure, the patient as admitted and observed on 2C.  She underwent serial calcium checks and was noted to become hypocalcemic with symptoms and numbness and tingling and cramping.  Patient tolerated soft diet and ambulated without difficulty.  Patient's drain was removed on POD#1.  Calcium levels stabilized after rocaltrol as begun and the patient was discharged on 05/31/2018 with follow up on 12/07/14/2017 at [facility].  Discharge diagnosis:  S/p total thyroidectomy, post-operative hypocalcemia  Discharge medications:  .Marland Kitchen Allergies as of 05/31/2018      Reactions   Contrast Media [iodinated Diagnostic Agents] Swelling   Iodine    Other Hives, Other (See Comments)   Spider bite (has had two) each time caused blisters/whelps and redness and pt had to go to ER. She now carries Epipen for this reason.   Sulfa Antibiotics Rash, Hives   Blisters, hive, itching      Medication List    STOP taking these medications   DEXILANT 60 MG capsule Generic drug:  dexlansoprazole   meloxicam 15 MG tablet Commonly known as:  MOBIC   oxyCODONE 5 MG immediate release tablet Commonly known as:  Oxy IR/ROXICODONE Replaced by:  oxyCODONE 5 MG/5ML solution   traZODone 100 MG tablet Commonly known as:  DESYREL     TAKE these medications   acetaminophen 500 MG tablet Commonly known as:  TYLENOL Take 1,000 mg by mouth every 6 (six) hours as needed.   calcitRIOL 0.25 MCG capsule Commonly known as:  ROCALTROL Take 1 capsule (0.25 mcg total) by mouth daily with breakfast.   calcium-vitamin D 500-200 MG-UNIT tablet Commonly known as:  OSCAL 500/200 D-3 Take 2 tablets by mouth 3 (three) times daily.   cyclobenzaprine 10 MG tablet Commonly known as:  FLEXERIL Take 1 tablet (10 mg  total) by mouth 3 (three) times daily as needed for muscle spasms.   doxepin 10 MG capsule Commonly known as:  SINEQUAN Take 10-20 mg by mouth at bedtime.   famotidine 40 MG tablet Commonly known as:  PEPCID Take 40 mg by mouth daily.   fluticasone 50 MCG/ACT nasal spray Commonly known as:  FLONASE Place 2 sprays into both nostrils 2 (two) times daily.   furosemide 20 MG tablet Commonly known as:  LASIX Take 1 tablet (20 mg total) by mouth daily.   gabapentin 300 MG capsule Commonly known as:  NEURONTIN Take 1-2 capsules (300-600 mg total) by mouth 3 (three) times daily. Follow written titration schedule. What changed:  how much to take   hydroxypropyl methylcellulose / hypromellose 2.5 % ophthalmic solution Commonly known as:  ISOPTO TEARS / GONIOVISC Place 1 drop into both eyes daily as needed for dry eyes.   hydrOXYzine 50 MG tablet Commonly known as:  ATARAX/VISTARIL Take 1 tablet (50 mg total) by mouth every 6 (six) hours as needed (insomina).   KLOR-CON 10 10 MEQ tablet Generic drug:  potassium chloride Take 10 mEq by mouth daily.   lisinopril-hydrochlorothiazide 20-25 MG tablet Commonly known as:  PRINZIDE,ZESTORETIC Take 1 tablet by mouth daily.   multivitamin capsule Take 1 capsule by mouth daily.   MYRBETRIQ 50 MG Tb24 tablet Generic drug:  mirabegron ER Take 50 mg by mouth daily.   ondansetron 4 MG tablet Commonly known as:  ZOFRAN Take 1 tablet (4 mg total)  by mouth every 8 (eight) hours as needed for up to 10 doses for nausea or vomiting.   oxyCODONE 5 MG/5ML solution Commonly known as:  ROXICODONE Take 10 mLs (10 mg total) by mouth every 4 (four) hours as needed for severe pain. Replaces:  oxyCODONE 5 MG immediate release tablet   prazosin 2 MG capsule Commonly known as:  MINIPRESS Take 2-4 mg by mouth at bedtime as needed (anxiety).   senna-docusate 8.6-50 MG tablet Commonly known as:  Senokot-S Take 1 tablet by mouth at bedtime as needed  for mild constipation.   SYMBICORT 160-4.5 MCG/ACT inhaler Generic drug:  budesonide-formoterol Inhale 2 puffs into the lungs 2 (two) times daily.      .. Discharge Instructions    Call MD for:  difficulty breathing, headache or visual disturbances   Complete by:  As directed    Call MD for:  difficulty breathing, headache or visual disturbances   Complete by:  As directed    Call MD for:  persistant nausea and vomiting   Complete by:  As directed    Call MD for:  persistant nausea and vomiting   Complete by:  As directed    Call MD for:  redness, tenderness, or signs of infection (pain, swelling, redness, odor or green/yellow discharge around incision site)   Complete by:  As directed    Call MD for:  redness, tenderness, or signs of infection (pain, swelling, redness, odor or green/yellow discharge around incision site)   Complete by:  As directed    Call MD for:  severe uncontrolled pain   Complete by:  As directed    Call MD for:  severe uncontrolled pain   Complete by:  As directed    Call MD for:  temperature >100.4   Complete by:  As directed    Call MD for:  temperature >100.4   Complete by:  As directed    Diet general   Complete by:  As directed    Diet general   Complete by:  As directed    Discharge instructions   Complete by:  As directed    No heavy lifting or strenuous activity for 2 weeks.  Limit bending over and straining.  Take Oscal D 2 tablets three times a day until follow up.  Follow up on Monday or Tuesday of next week.  Drink plenty of fluids.   Discharge instructions   Complete by:  As directed    Take Calcium Carbonate-Vitamin D 2 pills 3 times per day until follow up.  Follow up tomorrow afternoon at [facility].  Take Rocaltrol once a day in morning.  Take pain medication and anti-nausea medication as needed.   Increase activity slowly   Complete by:  As directed    Increase activity slowly   Complete by:  As directed    Lifting  restrictions   Complete by:  As directed    No lifting over 5 pounds for 2 weeks.   Lifting restrictions   Complete by:  As directed    No lifting over 5 pounds for 2 weeks.     Discharge plan:  Follow up with [facility] on 06/02/2018 for post-operative blood draw for calcium evaluation.

## 2018-06-11 NOTE — Progress Notes (Addendum)
Tumor Board Documentation  ISABEL ARDILA was presented by Dr Pryor Ochoa at our Tumor Board on 06/11/2018, which included representatives from radiation oncology, surgical oncology, surgical, radiology, pathology, research, pulmonology.  Eldena currently presents for discussion, for new positive pathology with history of the following treatments: surgical intervention(s).  Additionally, we reviewed previous medical and familial history, history of present illness, and recent lab results along with all available histopathologic and imaging studies. The tumor board considered available treatment options and made the following recommendations:   See Dr Ladell Pier in Endocrinology, may need CT The following procedures/referrals were also placed: No orders of the defined types were placed in this encounter.   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   National site-specific guidelines   were discussed with respect to the case.  Tumor board is a meeting of clinicians from various specialty areas who evaluate and discuss patients for whom a multidisciplinary approach is being considered. Final determinations in the plan of care are those of the provider(s). The responsibility for follow up of recommendations given during tumor board is that of the provider.   Today's extended care, comprehensive team conference, Darlene was not present for the discussion and was not examined.   Multidisciplinary Tumor Board is a multidisciplinary case peer review process.  Decisions discussed in the Multidisciplinary Tumor Board reflect the opinions of the specialists present at the conference without having examined the patient.  Ultimately, treatment and diagnostic decisions rest with the primary provider(s) and the patient.

## 2018-06-17 ENCOUNTER — Encounter: Payer: Self-pay | Admitting: Nurse Practitioner

## 2018-06-17 ENCOUNTER — Other Ambulatory Visit
Admission: RE | Admit: 2018-06-17 | Discharge: 2018-06-17 | Disposition: A | Discharge status: Home / Self Care | Payer: Medicare Other | Source: Ambulatory Visit | Attending: Otolaryngology | Admitting: Otolaryngology

## 2018-06-17 ENCOUNTER — Ambulatory Visit (HOSPITAL_BASED_OUTPATIENT_CLINIC_OR_DEPARTMENT_OTHER): Payer: Medicare Other | Admitting: Nurse Practitioner

## 2018-06-17 ENCOUNTER — Other Ambulatory Visit: Payer: Self-pay

## 2018-06-17 VITALS — BP 144/74 | HR 79 | Temp 98.3°F | Resp 16 | Ht 67.0 in | Wt 244.0 lb

## 2018-06-17 DIAGNOSIS — C73 Malignant neoplasm of thyroid gland: Secondary | ICD-10-CM | POA: Insufficient documentation

## 2018-06-17 DIAGNOSIS — M47816 Spondylosis without myelopathy or radiculopathy, lumbar region: Secondary | ICD-10-CM

## 2018-06-17 DIAGNOSIS — Z79891 Long term (current) use of opiate analgesic: Secondary | ICD-10-CM | POA: Insufficient documentation

## 2018-06-17 DIAGNOSIS — M7918 Myalgia, other site: Secondary | ICD-10-CM

## 2018-06-17 DIAGNOSIS — M47812 Spondylosis without myelopathy or radiculopathy, cervical region: Secondary | ICD-10-CM

## 2018-06-17 DIAGNOSIS — R252 Cramp and spasm: Secondary | ICD-10-CM | POA: Diagnosis not present

## 2018-06-17 DIAGNOSIS — G894 Chronic pain syndrome: Secondary | ICD-10-CM | POA: Insufficient documentation

## 2018-06-17 DIAGNOSIS — M792 Neuralgia and neuritis, unspecified: Secondary | ICD-10-CM

## 2018-06-17 LAB — TSH: TSH: 4.379 u[IU]/mL (ref 0.350–4.500)

## 2018-06-17 LAB — CALCIUM: Calcium: 9.1 mg/dL (ref 8.9–10.3)

## 2018-06-17 MED ORDER — GABAPENTIN 300 MG PO CAPS: 300.0000 mg | ORAL_CAPSULE | Freq: Four times a day (QID) | ORAL | 2 refills | Status: DC

## 2018-06-17 MED ORDER — OXYCODONE HCL 5 MG PO TABS: 5.0000 mg | ORAL_TABLET | Freq: Four times a day (QID) | ORAL | 0 refills | Status: DC | PRN

## 2018-06-17 MED ORDER — CYCLOBENZAPRINE HCL 10 MG PO TABS: 10.0000 mg | ORAL_TABLET | Freq: Three times a day (TID) | ORAL | 2 refills | Status: DC | PRN

## 2018-06-17 NOTE — Patient Instructions (Addendum)
____________________________________________________________________________________________  Medication Rules  Purpose: To inform patients, and their family members, of our rules and regulations.  Applies to: All patients receiving prescriptions (written or electronic).  Pharmacy of record: Pharmacy where electronic prescriptions will be sent. If written prescriptions are taken to a different pharmacy, please inform the nursing staff. The pharmacy listed in the electronic medical record should be the one where you would like electronic prescriptions to be sent.  Electronic prescriptions: In compliance with the [place] Strengthen Opioid Misuse Prevention (STOP) Act of 2017 (Session Law 2017-74/H243), effective June 10, 2018, all controlled substances must be electronically prescribed. Calling prescriptions to the pharmacy will cease to exist.  Prescription refills: Only during scheduled appointments. Applies to all prescriptions.  NOTE: The following applies primarily to controlled substances (Opioid* Pain Medications).   Patient's responsibilities: 1. Pain Pills: Bring all pain pills to every appointment (except for procedure appointments). 2. Pill Bottles: Bring pills in original pharmacy bottle. Always bring the newest bottle. Bring bottle, even if empty. 3. Medication refills: You are responsible for knowing and keeping track of what medications you take and those you need refilled. The day before your appointment: write a list of all prescriptions that need to be refilled. The day of the appointment: give the list to the admitting nurse. Prescriptions will be written only during appointments. If you forget a medication: it will not be "Called in", "Faxed", or "electronically sent". You will need to get another appointment to get these prescribed. No early refills. Do not call asking to have your prescription filled early. 4. Prescription Accuracy: You are responsible for  carefully inspecting your prescriptions before leaving our office. Have the discharge nurse carefully go over each prescription with you, before taking them home. Make sure that your name is accurately spelled, that your address is correct. Check the name and dose of your medication to make sure it is accurate. Check the number of pills, and the written instructions to make sure they are clear and accurate. Make sure that you are given enough medication to last until your next medication refill appointment. 5. Taking Medication: Take medication as prescribed. When it comes to controlled substances, taking less pills or less frequently than prescribed is permitted and encouraged. Never take more pills than instructed. Never take medication more frequently than prescribed.  6. Inform other Doctors: Always inform, all of your healthcare providers, of all the medications you take. 7. Pain Medication from other Providers: You are not allowed to accept any additional pain medication from any other Doctor or Healthcare provider. There are two exceptions to this rule. (see below) In the event that you require additional pain medication, you are responsible for notifying us, as stated below. 8. Medication Agreement: You are responsible for carefully reading and following our Medication Agreement. This must be signed before receiving any prescriptions from our practice. Safely store a copy of your signed Agreement. Violations to the Agreement will result in no further prescriptions. (Additional copies of our Medication Agreement are available upon request.) 9. Laws, Rules, & Regulations: All patients are expected to follow all Federal and State Laws, Statutes, Rules, & Regulations. Ignorance of the Laws does not constitute a valid excuse. The use of any illegal substances is prohibited. 10. Adopted CDC guidelines & recommendations: Target dosing levels will be at or below 60 MME/day. Use of benzodiazepines** is not  recommended.  Exceptions: There are only two exceptions to the rule of not receiving pain medications from other Healthcare Providers. 1.   Exception #1 (Emergencies): In the event of an emergency (i.e.: accident requiring emergency care), you are allowed to receive additional pain medication. However, you are responsible for: As soon as you are able, call our office [phone], at any time of the day or night, and leave a message stating your name, the date and nature of the emergency, and the name and dose of the medication prescribed. In the event that your call is answered by a member of our staff, make sure to document and save the date, time, and the name of the person that took your information.  2. Exception #2 (Planned Surgery): In the event that you are scheduled by another doctor or dentist to have any type of surgery or procedure, you are allowed (for a period no longer than 30 days), to receive additional pain medication, for the acute post-op pain. However, in this case, you are responsible for picking up a copy of our "Post-op Pain Management for Surgeons" handout, and giving it to your surgeon or dentist. This document is available at our office, and does not require an appointment to obtain it. Simply go to our office during business hours (Monday-Thursday from 8:00 AM to 4:00 PM) (Friday 8:00 AM to 12:00 Noon) or if you have a scheduled appointment with [place], prior to your surgery, and ask for it by name. In addition, you will need to provide [place] with your name, name of your surgeon, type of surgery, and date of procedure or surgery.  *Opioid medications include: morphine, codeine, oxycodone, oxymorphone, hydrocodone, hydromorphone, meperidine, tramadol, tapentadol, buprenorphine, fentanyl, methadone. **Benzodiazepine medications include: diazepam (Valium), alprazolam (Xanax), clonazepam (Klonopine), lorazepam (Ativan), clorazepate (Tranxene), chlordiazepoxide (Librium), estazolam (Prosom),  oxazepam (Serax), temazepam (Restoril), triazolam (Halcion) (Last updated: 08/07/2017) ____________________________________________________________________________________________    BMI Assessment: Estimated body mass index is 38.22 kg/m as calculated from the following:   Height as of this encounter: 5\' 7"  (1.702 m).   Weight as of this encounter: 244 lb (110.7 kg).  BMI interpretation table: BMI level Category Range association with higher incidence of chronic pain  <18 kg/m2 Underweight   18.5-24.9 kg/m2 Ideal body weight   25-29.9 kg/m2 Overweight Increased incidence by 20%  30-34.9 kg/m2 Obese (Class I) Increased incidence by 68%  35-39.9 kg/m2 Severe obesity (Class II) Increased incidence by 136%  >40 kg/m2 Extreme obesity (Class III) Increased incidence by 254%   Patient's current BMI Ideal Body weight  Body mass index is 38.22 kg/m. Ideal body weight: 61.6 kg (135 lb 12.9 oz) Adjusted ideal body weight: 81.2 kg (179 lb 1.3 oz)   BMI Readings from Last 4 Encounters:  06/17/18 38.22 kg/m  05/28/18 39.12 kg/m  05/21/18 38.55 kg/m  03/20/18 37.28 kg/m   Wt Readings from Last 4 Encounters:  06/17/18 244 lb (110.7 kg)  05/28/18 249 lb 12.5 oz (113.3 kg)  05/21/18 249 lb 12.8 oz (113.3 kg)  03/20/18 238 lb (108 kg)   Oxycodone 5 mg x 3 months escribed to pharmacy.  Fill dates are:  07/08/18, 08/07/18, 09/06/18  Flexeril and gabapentin also escribed.

## 2018-06-17 NOTE — Progress Notes (Signed)
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 radiofrequencyablation Diagnostic bilateralintra-articular knee injection Diagnostic bilateral genicular nerve block Possible bilateral genicular nerve radiofrequencyablation Diagnostic bilateral lumbar facet block Possible bilateral lumbar facet radiofrequencyablation Diagnostic bilateral sacroiliac joint block Possible bilateral sacroiliac joint radiofrequencyablation Diagnostic bilateralintra-articular hip joint injection Possible bilateral hip radiofrequency ablation Diagnostic bilateralintra-articular shoulder joint injection Diagnostic bilateral suprascapular nerve block Possible bilateral suprascapular nerve radiofrequencyablation   Palliative PRN treatment(s):     Provider-requested follow-up: Return in about 3 months (around 09/16/2018) for MedMgmt.  Future Appointments  Date Time Provider [place]  06/22/2018  2:30 PM Bjorn Loser, MD [facility] None  07/29/2018  9:15 AM Garrel Ridgel, DPM [facility]  09/14/2018   9:15 AM Vevelyn Francois, NP [facility] None   Primary Care Physician: Frazier Richards, MD Location: [facility] Note by: Vevelyn Francois NP Date: 06/17/2018; Time: 3:29 PM  Pain Score Disclaimer: We use the NRS-11 scale. This is a self-reported, subjective measurement of pain severity with only modest accuracy. It is used primarily to identify changes within a particular patient. It must be understood that outpatient pain scales are significantly less accurate that those used for research, where they can be applied under ideal controlled circumstances with minimal exposure to variables. In reality, the score is likely to be a combination of pain intensity and pain affect, where pain affect describes the degree of emotional arousal or changes in action readiness caused by the sensory experience of pain. Factors such as social and work situation, setting, emotional state, anxiety levels, expectation, and prior pain experience may influence pain perception and show large inter-individual differences that may also be affected by time variables.  Patient instructions provided during this appointment: Patient Instructions   ____________________________________________________________________________________________  Medication Rules  Purpose: To inform patients, and their family members, of our rules and regulations.  Applies to: All patients receiving prescriptions (written or electronic).  Pharmacy of record: Pharmacy where electronic prescriptions will be sent. If written prescriptions are taken to a different pharmacy, please inform the nursing staff. The pharmacy listed in the electronic medical record should be the one where you would like electronic prescriptions to be sent.  Electronic prescriptions: In compliance with the [place] (STOP)  Act of 2017 (Session Lanny Cramp 2423-53/I144), effective June 10, 2018, all controlled  substances must be electronically prescribed. Calling prescriptions to the pharmacy will cease to exist.  Prescription refills: Only during scheduled appointments. Applies to all prescriptions.  NOTE: The following applies primarily to controlled substances (Opioid* Pain Medications).   Patient's responsibilities: 1. Pain Pills: Bring all pain pills to every appointment (except for procedure appointments). 2. Pill Bottles: Bring pills in original pharmacy bottle. Always bring the newest bottle. Bring bottle, even if empty. 3. Medication refills: You are responsible for knowing and keeping track of what medications you take and those you need refilled. The day before your appointment: write a list of all prescriptions that need to be refilled. The day of the appointment: give the list to the admitting nurse. Prescriptions will be written only during appointments. If you forget a medication: it will not be "Called in", "Faxed", or "electronically sent". You will need to get another appointment to get these prescribed. No early refills. Do not call asking to have your prescription filled early. 4. Prescription Accuracy: You are responsible for carefully inspecting your prescriptions before leaving our office. Have the discharge nurse carefully go over each prescription with you, before taking them home. Make sure that your name is accurately spelled, that your address is correct. Check the name and dose of your medication to make sure it is accurate. Check the number of pills, and the written instructions to make sure they are clear and accurate. Make sure that you are given enough medication to last until your next medication refill appointment. 5. Taking Medication: Take medication as prescribed. When it comes to controlled substances, taking less pills or less frequently than prescribed is permitted and encouraged. Never take more pills than instructed. Never take medication more frequently than  prescribed.  6. Inform other Doctors: Always inform, all of your healthcare providers, of all the medications you take. 7. Pain Medication from other Providers: You are not allowed to accept any additional pain medication from any other Doctor or Healthcare provider. There are two exceptions to this rule. (see below) In the event that you require additional pain medication, you are responsible for notifying us, as stated below. 8. Medication Agreement: You are responsible for carefully reading and following our Medication Agreement. This must be signed before receiving any prescriptions from our practice. Safely store a copy of your signed Agreement. Violations to the Agreement will result in no further prescriptions. (Additional copies of our Medication Agreement are available upon request.) 9. Laws, Rules, & Regulations: All patients are expected to follow all Federal and [employer], [employer], Rules, [employer]. Ignorance of the Laws does not constitute a valid excuse. The use of any illegal substances is prohibited. 10. Adopted CDC guidelines & recommendations: Target dosing levels will be at or below 60 MME/day. Use of benzodiazepines** is not recommended.  Exceptions: There are only two exceptions to the rule of not receiving pain medications from other Healthcare Providers. 1. Exception #1 (Emergencies): In the event of an emergency (i.e.: accident requiring emergency care), you are allowed to receive additional pain medication. However, you are responsible for: As soon as you are able, call our office [phone], at any time of the day or night, and leave a message stating your name, the date and nature of the emergency, and the name and dose of the medication prescribed. In the event that your call is answered by a member of our staff, make sure to document and  save the date, time, and the name of the person that took your information.  2. Exception #2 (Planned Surgery): In the event that you  are scheduled by another doctor or dentist to have any type of surgery or procedure, you are allowed (for a period no longer than 30 days), to receive additional pain medication, for the acute post-op pain. However, in this case, you are responsible for picking up a copy of our "Post-op Pain Management for Surgeons" handout, and giving it to your surgeon or dentist. This document is available at our office, and does not require an appointment to obtain it. Simply go to our office during business hours (Monday-Thursday from 8:00 AM to 4:00 PM) (Friday 8:00 AM to 12:00 Noon) or if you have a scheduled appointment with [place], prior to your surgery, and Gomez for it by name. In addition, you will need to provide [place] with your name, name of your surgeon, type of surgery, and date of procedure or surgery.  *Opioid medications include: morphine, codeine, oxycodone, oxymorphone, hydrocodone, hydromorphone, meperidine, tramadol, tapentadol, buprenorphine, fentanyl, methadone. **Benzodiazepine medications include: diazepam (Valium), alprazolam (Xanax), clonazepam (Klonopine), lorazepam (Ativan), clorazepate (Tranxene), chlordiazepoxide (Librium), estazolam (Prosom), oxazepam (Serax), temazepam (Restoril), triazolam (Halcion) (Last updated: 08/07/2017) ____________________________________________________________________________________________    BMI Assessment: Estimated body mass index is 38.22 kg/m as calculated from the following:   Height as of this encounter: '5\' 7"'  (1.702 m).   Weight as of this encounter: 244 lb (110.7 kg).  BMI interpretation table: BMI level Category Range association with higher incidence of chronic pain  <18 kg/m2 Underweight   18.5-24.9 kg/m2 Ideal body weight   25-29.9 kg/m2 Overweight Increased incidence by 20%  30-34.9 kg/m2 Obese (Class I) Increased incidence by 68%  35-39.9 kg/m2 Severe obesity (Class II) Increased incidence by 136%  >40 kg/m2 Extreme obesity (Class III)  Increased incidence by 254%   Patient's current BMI Ideal Body weight  Body mass index is 38.22 kg/m. Ideal body weight: 61.6 kg (135 lb 12.9 oz) Adjusted ideal body weight: 81.2 kg (179 lb 1.3 oz)   BMI Readings from Last 4 Encounters:  06/17/18 38.22 kg/m  05/28/18 39.12 kg/m  05/21/18 38.55 kg/m  03/20/18 37.28 kg/m   Wt Readings from Last 4 Encounters:  06/17/18 244 lb (110.7 kg)  05/28/18 249 lb 12.5 oz (113.3 kg)  05/21/18 249 lb 12.8 oz (113.3 kg)  03/20/18 238 lb (108 kg)   Oxycodone 5 mg x 3 months escribed to pharmacy.  Fill dates are:  07/08/18, 08/07/18, 09/06/18  Flexeril and gabapentin also escribed.

## 2018-06-17 NOTE — Progress Notes (Signed)
Nursing Pain Medication Assessment:  Safety precautions to be maintained throughout the outpatient stay will include: orient to surroundings, keep bed in low position, maintain call bell within reach at all times, provide assistance with transfer out of bed and ambulation.  Medication Inspection Compliance: Pill count conducted under aseptic conditions, in front of the patient. Neither the pills nor the bottle was removed from the patient's sight at any time. Once count was completed pills were immediately returned to the patient in their original bottle.  Medication: Oxycodone IR Pill/Patch Count: 72 of 120 pills remain Pill/Patch Appearance: Markings consistent with prescribed medication Bottle Appearance: Standard pharmacy container. Clearly labeled. Filled Date:12/30/ 2019 Last Medication intake:  Today   Also has Oxycodone 5mg /5 ml for post thyroidectomy. None left in the bottle.

## 2018-06-19 LAB — CALCIUM, IONIZED: Calcium, Ionized, Serum: 4.9 mg/dL (ref 4.5–5.6)

## 2018-06-19 LAB — PARATHYROID HORMONE, INTACT (NO CA): PTH: 25 pg/mL (ref 15–65)

## 2018-06-21 ENCOUNTER — Other Ambulatory Visit: Payer: Self-pay | Admitting: Podiatry

## 2018-06-22 ENCOUNTER — Ambulatory Visit (INDEPENDENT_AMBULATORY_CARE_PROVIDER_SITE_OTHER): Payer: Medicare Other | Admitting: Urology

## 2018-06-22 ENCOUNTER — Encounter: Payer: Self-pay | Admitting: Urology

## 2018-06-22 ENCOUNTER — Ambulatory Visit: Payer: Medicare Other | Admitting: Podiatry

## 2018-06-22 VITALS — BP 128/81 | HR 87 | Ht 68.0 in | Wt 244.0 lb

## 2018-06-22 DIAGNOSIS — N3946 Mixed incontinence: Secondary | ICD-10-CM

## 2018-06-22 MED ORDER — OXYBUTYNIN CHLORIDE ER 10 MG PO TB24: 10.0000 mg | ORAL_TABLET | Freq: Every day | ORAL | 11 refills | Status: AC

## 2018-06-22 NOTE — Progress Notes (Signed)
06/22/2018 2:27 PM   Kristina Gomez April 03, 1966 [id number]  Referring provider: Frazier Richards, [place] [address], [place] [address]  Chief Complaint  Patient presents with  . UDS results    HPI: [name]: In August 2019 patient given Myrbetriq and was noted to have a cystocele referred to gynecology for pessary but patient deferred  The patient leaks with coughing sneezing bending lifting.  Sometimes she has urge incontinence.  They increase the dosage of Myrbetriq to 50 mg and she thinks he is 80% improved now wearing 1 pad per day  At baseline she was wearing 2 pads a day moderately wet.  One time she had high volume leakage not associated with awareness triggered when she went from a sitting to standing position.  I think she gets urgency in the middle of the night but not true foot on the floor syndrome with urge incontinence.  She voids every 2 hours and gets up once or twice at night but more frequently recently but she is drinking a lot of water  She has not had a hysterectomy.  She has a history of spinal stenosis   The patient had a small grade 1 cystocele and distal grade 1 rectocele.  She had a vaginal epithelial tissue elevation at the most caudal aspect of the cystocele near the trigone.  She had no diverticulum.  She had mild hypermobility the bladder neck and negative cough test  Patient has mixed incontinence.  She is improved on Myrbetriq.  She does drink a lot of water.  Role of urodynamics discussed.  From time to time she thinks she feels some pressure in the vagina.  I think was more suprapubic.  It is intermittent and mild.  I do not think she is symptomatic prolapse.  If she does have urodynamics we will reassess the size of the cystocele.  She leaks with walking and wants to have urodynamics.  She will stay on the Myrbetriq.  Today Frequency and incontinence are stable On urodynamics the patient emptied efficiently voiding 747 mL with an excellent  pattern.  Maximum bladder capacity was 726 mL.  Bladder was unstable reaching a pressure 3 cm of water.  She felt an urge and leaked a moderate amount at 300 mL she leaked a mild amount at 159 cm of water.  At 700 mL she leaked a mild amount of 158 cm of water.  During voluntary voiding she took time before she could initiate urination.  She voided 277 mL with a maximum flow 13 mils per second.  Maximum voiding pressure was 21 cm of water.  Residual was 726 mL.  Based on this bladder capacity is as high as 1 L.  Her bladder is hyposensitive.  No x-rays given due to allergy.   PMH: Past Medical History:  Diagnosis Date  . Anxiety   . Arthritis   . Asthma   . Chronic pain   . Chronic pain syndrome 04/10/2015  . CPAP (continuous positive airway pressure) dependence    no longer wears/ wt loss  . Degenerative arthritis of hip 04/10/2015  . Depression   . Diaphragmatic hernia 04/10/2015  . Family history of adverse reaction to anesthesia    siater required medicine to wake up  . Food poisoning 09/27/2017   [facility] in [place], [place]  . GERD (gastroesophageal reflux disease)   . Headache   . Hearing loss   . Hiatal hernia   . Hypertension   .  Migraines   . Neuromuscular disorder ([place])    peripheral neuropathy  . Osteoporosis   . Reflux   . Shortness of breath dyspnea   . Sleep apnea    No longer has due to >70 lbs weight loss  . Vision loss     Surgical History: Past Surgical History:  Procedure Laterality Date  . DILATION AND CURETTAGE OF UTERUS    . ESOPHAGOGASTRODUODENOSCOPY (EGD) WITH PROPOFOL N/A 05/08/2017   Procedure: ESOPHAGOGASTRODUODENOSCOPY (EGD) WITH PROPOFOL;  Surgeon: Jonathon Bellows, MD;  Location[facility] ENDOSCOPY;  Service: Gastroenterology;  Laterality: N/A;  . INNER EAR SURGERY Bilateral   . THYROIDECTOMY N/A 05/28/2018   Procedure: TOTAL THYROIDECTOMY WITH LARYNGEAL NERVE MONITORING;  Surgeon: Carloyn Manner, MD;  Location: [facility] ORS;  Service:  ENT;  Laterality: N/A;    Home Medications:  Allergies as of 06/22/2018      Reactions   Contrast Media [iodinated Diagnostic Agents] Swelling   Iodine    Other Hives, Other (See Comments)   Spider bite (has had two) each time caused blisters/whelps and redness and pt had to go to ER. She now carries Epipen for this reason.   Sulfa Antibiotics Rash, Hives   Blisters, hive, itching      Medication List       Accurate as of June 22, 2018  2:27 PM. Always use your most recent med list.        acetaminophen 500 MG tablet Commonly known as:  TYLENOL Take 1,000 mg by mouth every 6 (six) hours as needed.   calcitRIOL 0.25 MCG capsule Commonly known as:  ROCALTROL Take 1 capsule (0.25 mcg total) by mouth daily with breakfast.   calcium-vitamin D 500-200 MG-UNIT tablet Commonly known as:  OSCAL 500/200 D-3 Take 2 tablets by mouth 3 (three) times daily.   cyclobenzaprine 10 MG tablet Commonly known as:  FLEXERIL Take 1 tablet (10 mg total) by mouth 3 (three) times daily as needed for muscle spasms. Start taking on:  July 08, 2018   doxepin 10 MG capsule Commonly known as:  SINEQUAN Take 10-20 mg by mouth at bedtime.   famotidine 40 MG tablet Commonly known as:  PEPCID Take 40 mg by mouth daily.   fluticasone 50 MCG/ACT nasal spray Commonly known as:  FLONASE Place 2 sprays into both nostrils 2 (two) times daily.   furosemide 20 MG tablet Commonly known as:  LASIX Take 1 tablet (20 mg total) by mouth daily.   gabapentin 300 MG capsule Commonly known as:  NEURONTIN Take 1 capsule (300 mg total) by mouth 4 (four) times daily. Follow written titration schedule. Start taking on:  July 08, 2018   hydroxypropyl methylcellulose / hypromellose 2.5 % ophthalmic solution Commonly known as:  ISOPTO TEARS / GONIOVISC Place 1 drop into both eyes daily as needed for dry eyes.   hydrOXYzine 50 MG tablet Commonly known as:  ATARAX/VISTARIL Take 1 tablet (50 mg total) by  mouth every 6 (six) hours as needed (insomina).   KLOR-CON 10 10 MEQ tablet Generic drug:  potassium chloride Take 10 mEq by mouth daily.   lisinopril-hydrochlorothiazide 20-25 MG tablet Commonly known as:  PRINZIDE,ZESTORETIC Take 1 tablet by mouth daily.   multivitamin capsule Take 1 capsule by mouth daily.   MYRBETRIQ 50 MG Tb24 tablet Generic drug:  mirabegron ER Take 50 mg by mouth daily.   ondansetron 4 MG tablet Commonly known as:  ZOFRAN Take 1 tablet (4 mg total) by mouth every 8 (eight) hours as  needed for up to 10 doses for nausea or vomiting.   oxyCODONE 5 MG immediate release tablet Commonly known as:  Oxy IR/ROXICODONE Take 1 tablet (5 mg total) by mouth every 6 (six) hours as needed for severe pain. Start taking on:  July 08, 2018   oxyCODONE 5 MG immediate release tablet Commonly known as:  Oxy IR/ROXICODONE Take 1 tablet (5 mg total) by mouth every 6 (six) hours as needed for severe pain. Start taking on:  August 07, 2018   oxyCODONE 5 MG immediate release tablet Commonly known as:  Oxy IR/ROXICODONE Take 1 tablet (5 mg total) by mouth every 6 (six) hours as needed for severe pain. Start taking on:  September 06, 2018   prazosin 2 MG capsule Commonly known as:  MINIPRESS Take 2-4 mg by mouth at bedtime as needed (anxiety).   senna-docusate 8.6-50 MG tablet Commonly known as:  Senokot-S Take 1 tablet by mouth at bedtime as needed for mild constipation.   SYMBICORT 160-4.5 MCG/ACT inhaler Generic drug:  budesonide-formoterol Inhale 2 puffs into the lungs 2 (two) times daily.       Allergies:  Allergies  Allergen Reactions  . Contrast Media [Iodinated Diagnostic Agents] Swelling  . Iodine   . Other Hives and Other (See Comments)    Spider bite (has had two) each time caused blisters/whelps and redness and pt had to go to ER. She now carries Epipen for this reason.   . Sulfa Antibiotics Rash and Hives    Blisters, hive, itching    Family  History: Family History  Problem Relation Age of Onset  . Diabetes Mother   . Heart disease Mother   . Kidney disease Mother   . COPD Father   . Breast cancer Paternal Aunt 75    Social History:  reports that she quit smoking about 20 years ago. She has never used smokeless tobacco. She reports that she does not drink alcohol or use drugs.  ROS: UROLOGY Frequent Urination?: No Hard to postpone urination?: Yes Burning/pain with urination?: No Get up at night to urinate?: Yes Leakage of urine?: Yes Urine stream starts and stops?: No Trouble starting stream?: No Do you have to strain to urinate?: No Blood in urine?: No Urinary tract infection?: No Sexually transmitted disease?: No Injury to kidneys or bladder?: No Painful intercourse?: No Weak stream?: No Currently pregnant?: No Vaginal bleeding?: No Last menstrual period?: n  Gastrointestinal Nausea?: No Vomiting?: No Indigestion/heartburn?: No Diarrhea?: No Constipation?: Yes  Constitutional Fever: No Night sweats?: Yes Weight loss?: No Fatigue?: Yes  Skin Skin rash/lesions?: No Itching?: No  Eyes Blurred vision?: Yes Double vision?: Yes  Ears/Nose/Throat Sore throat?: No Sinus problems?: Yes  Hematologic/Lymphatic Swollen glands?: No Easy bruising?: No  Cardiovascular Leg swelling?: Yes Chest pain?: No  Respiratory Cough?: No Shortness of breath?: No  Endocrine Excessive thirst?: No  Musculoskeletal Back pain?: Yes Joint pain?: Yes  Neurological Headaches?: No Dizziness?: Yes  Psychologic Depression?: Yes Anxiety?: Yes  Physical Exam: BP 128/81 (BP Location: Left Arm, Patient Position: Sitting)   Pulse 87   Ht 5\' 8"  (1.727 m)   Wt 110.7 kg   LMP 08/23/2016   BMI 37.10 kg/m   Constitutional:  Alert and oriented, No acute distress.   Laboratory Data: Lab Results  Component Value Date   WBC 6.1 08/27/2015   HGB 13.6 08/27/2015   HCT 39.4 08/27/2015   MCV 84.6 08/27/2015    PLT 190 08/27/2015    Lab Results  Component Value Date   CREATININE 0.97 07/23/2016    No results found for: PSA  No results found for: TESTOSTERONE  No results found for: HGBA1C  Urinalysis    Component Value Date/Time   COLORURINE Red 03/18/2012 2137   APPEARANCEUR Clear 03/30/2018 1412   LABSPEC 1.016 03/18/2012 2137   PHURINE 8.0 03/18/2012 2137   GLUCOSEU Negative 03/30/2018 1412   GLUCOSEU Negative 03/18/2012 2137   HGBUR 3+ 03/18/2012 2137   BILIRUBINUR Negative 03/30/2018 1412   BILIRUBINUR Negative 03/18/2012 2137   KETONESUR Negative 03/18/2012 2137   PROTEINUR Negative 03/30/2018 1412   PROTEINUR 100 mg/dL 03/18/2012 2137   NITRITE Negative 03/30/2018 1412   NITRITE Negative 03/18/2012 2137   LEUKOCYTESUR 1+ (A) 03/30/2018 1412   LEUKOCYTESUR 1+ 03/18/2012 2137    Pertinent Imaging:   Assessment & Plan: Patient has mixed incontinence with mild stress incontinence.  The urodynamics did not match well with her leakage with walking.  Based upon urodynamics she may be at higher risk of retention with a sling.  The role of physical therapy behavioral therapy and antimuscarinics discussed.  Leaks a small amount with coughing sneezing jogging and walking.  She hopes to avoid surgery as well.  She will try oxybutynin ER with the Myrbetriq and I will reevaluate in 6 weeks.  She is open then if needed to see physical therapy especially if I try another combination.  She understands she has a large capacity bladder  There are no diagnoses linked to this encounter.  No follow-ups on file.  Reece Packer, MD  [facility] [address] [place], [place] [address] [phone]

## 2018-06-25 DIAGNOSIS — C73 Malignant neoplasm of thyroid gland: Secondary | ICD-10-CM | POA: Insufficient documentation

## 2018-06-30 DIAGNOSIS — J45909 Unspecified asthma, uncomplicated: Secondary | ICD-10-CM | POA: Insufficient documentation

## 2018-07-06 ENCOUNTER — Other Ambulatory Visit: Payer: Self-pay | Admitting: "Endocrinology

## 2018-07-06 DIAGNOSIS — C73 Malignant neoplasm of thyroid gland: Secondary | ICD-10-CM

## 2018-07-10 ENCOUNTER — Other Ambulatory Visit: Payer: Self-pay | Admitting: Nurse Practitioner

## 2018-07-10 DIAGNOSIS — M792 Neuralgia and neuritis, unspecified: Secondary | ICD-10-CM

## 2018-07-15 ENCOUNTER — Encounter: Admission: RE | Admit: 2018-07-15 | Payer: Medicare Other | Source: Ambulatory Visit

## 2018-07-15 ENCOUNTER — Other Ambulatory Visit: Payer: Medicare Other

## 2018-07-15 ENCOUNTER — Other Ambulatory Visit
Admission: RE | Admit: 2018-07-15 | Discharge: 2018-07-15 | Disposition: A | Discharge status: Home / Self Care | Payer: Medicare Other | Source: Ambulatory Visit | Attending: Otolaryngology | Admitting: Otolaryngology

## 2018-07-15 LAB — CALCIUM: Calcium: 8.2 mg/dL — ABNORMAL LOW (ref 8.9–10.3)

## 2018-07-16 ENCOUNTER — Other Ambulatory Visit: Payer: Medicare Other

## 2018-07-29 ENCOUNTER — Encounter: Payer: Self-pay | Admitting: Podiatry

## 2018-07-29 ENCOUNTER — Ambulatory Visit (INDEPENDENT_AMBULATORY_CARE_PROVIDER_SITE_OTHER): Payer: Medicare Other | Admitting: Podiatry

## 2018-07-29 DIAGNOSIS — S9032XD Contusion of left foot, subsequent encounter: Secondary | ICD-10-CM

## 2018-07-29 NOTE — Progress Notes (Signed)
Presents today for follow-up of a strain or fracture to the second metatarsal of the left foot.  She states that is definitely improved and the boot really does help she says I cannot get any injections for pain because of the thyroid cancer and the treatments that I am taking.  Objective: Vital signs are stable alert and oriented x3.  Pulses are palpable.  Neurologic sensorium is intact.  Degenerative flexors are intact.  Muscle strength is normal symmetrical.  She has tenderness on palpation of the second metatarsal base of the left foot.  Particularly on frontal plane range of motion there is some tenderness.  Assessment: Probable sprain of the second metatarsal base of the left foot.  Plan: At this point I will follow-up with her in about a month and another set of x-rays to be performed at that time.

## 2018-08-03 ENCOUNTER — Encounter
Admission: RE | Admit: 2018-08-03 | Discharge: 2018-08-03 | Disposition: A | Discharge status: Home / Self Care | Payer: Medicare Other | Source: Ambulatory Visit | Attending: "Endocrinology | Admitting: "Endocrinology

## 2018-08-03 ENCOUNTER — Ambulatory Visit: Payer: Medicare Other | Admitting: Urology

## 2018-08-03 DIAGNOSIS — C73 Malignant neoplasm of thyroid gland: Secondary | ICD-10-CM | POA: Diagnosis not present

## 2018-08-03 MED ORDER — SODIUM IODIDE I 131 CAPSULE
71.5200 | Freq: Once | INTRAVENOUS | Status: AC | PRN
  Administered 2018-08-03: 71.52 via ORAL

## 2018-08-10 ENCOUNTER — Encounter
Admission: RE | Admit: 2018-08-10 | Discharge: 2018-08-10 | Disposition: A | Discharge status: Home / Self Care | Payer: Medicare Other | Source: Ambulatory Visit | Attending: "Endocrinology | Admitting: "Endocrinology

## 2018-08-10 ENCOUNTER — Other Ambulatory Visit: Payer: Self-pay

## 2018-08-10 DIAGNOSIS — C73 Malignant neoplasm of thyroid gland: Secondary | ICD-10-CM | POA: Diagnosis present

## 2018-08-21 ENCOUNTER — Other Ambulatory Visit: Payer: Self-pay | Admitting: Family Medicine

## 2018-08-26 ENCOUNTER — Other Ambulatory Visit: Payer: Self-pay | Admitting: Family Medicine

## 2018-08-26 DIAGNOSIS — Z1231 Encounter for screening mammogram for malignant neoplasm of breast: Secondary | ICD-10-CM

## 2018-08-31 ENCOUNTER — Ambulatory Visit: Payer: Medicare Other | Admitting: Urology

## 2018-09-03 ENCOUNTER — Ambulatory Visit: Payer: Medicare Other | Admitting: Podiatry

## 2018-09-09 ENCOUNTER — Ambulatory Visit: Payer: Medicare Other | Admitting: Podiatry

## 2018-09-09 ENCOUNTER — Ambulatory Visit: Payer: Medicare Other

## 2018-09-09 ENCOUNTER — Encounter: Payer: Medicare Other | Admitting: Podiatry

## 2018-09-10 NOTE — Progress Notes (Signed)
Erroneous encounter, please disregard

## 2018-09-14 ENCOUNTER — Ambulatory Visit: Payer: Medicare Other | Attending: Nurse Practitioner | Admitting: Nurse Practitioner

## 2018-09-14 ENCOUNTER — Other Ambulatory Visit: Payer: Self-pay

## 2018-09-14 DIAGNOSIS — M792 Neuralgia and neuritis, unspecified: Secondary | ICD-10-CM

## 2018-09-14 DIAGNOSIS — M7918 Myalgia, other site: Secondary | ICD-10-CM | POA: Diagnosis not present

## 2018-09-14 DIAGNOSIS — G894 Chronic pain syndrome: Secondary | ICD-10-CM

## 2018-09-14 DIAGNOSIS — M47812 Spondylosis without myelopathy or radiculopathy, cervical region: Secondary | ICD-10-CM | POA: Diagnosis not present

## 2018-09-14 DIAGNOSIS — M47816 Spondylosis without myelopathy or radiculopathy, lumbar region: Secondary | ICD-10-CM | POA: Diagnosis not present

## 2018-09-14 DIAGNOSIS — R252 Cramp and spasm: Secondary | ICD-10-CM

## 2018-09-14 MED ORDER — OXYCODONE HCL 5 MG PO TABS: 5.0000 mg | ORAL_TABLET | Freq: Four times a day (QID) | ORAL | 0 refills | Status: DC | PRN

## 2018-09-14 MED ORDER — CYCLOBENZAPRINE HCL 10 MG PO TABS: 10.0000 mg | ORAL_TABLET | Freq: Three times a day (TID) | ORAL | 2 refills | Status: DC | PRN

## 2018-09-14 MED ORDER — GABAPENTIN 300 MG PO CAPS: 300.0000 mg | ORAL_CAPSULE | Freq: Four times a day (QID) | ORAL | 2 refills | Status: DC

## 2018-09-14 NOTE — Progress Notes (Signed)
Pain Management Encounter Note - Virtual Visit via Telephone Telehealth (real-time audio visits between healthcare provider and patient).  Patient's Phone No. & Preferred Pharmacy:  [phone] (home); [phone] (mobile); (Preferred) [phone]  [id number] - [place],  - [address] [place] [address] Phone: [phone] Fax: [phone]   Pre-screening note:  Our staff contacted Kristina Gomez and offered her an "in person", "face-to-face" appointment versus a telephone encounter. She indicated preferring the telephone encounter, at this time.  Reason for Virtual Visit: COVID-19*  Social distancing based on CDC and AMA recommendations.   I contacted Kristina Gomez on 09/14/2018 at 9:24 AM by telephone and clearly identified myself as Dionisio David, NP. I verified that I was speaking with the correct person using two identifiers (Name and date of birth: Oct 17, 1965).  Advanced Informed Consent I sought verbal advanced consent from Kristina Gomez for telemedicine interactions and virtual visit. I informed Kristina Gomez of the security and privacy concerns, risks, and limitations associated with performing an evaluation and management service by telephone. I also informed Kristina Gomez of the availability of "in person" appointments and I informed her of the possibility of a patient responsible charge related to this service. Kristina Gomez expressed understanding and agreed to proceed.   Historic Elements   Kristina Gomez is a 53 y.o. year old, female patient evaluated today after her last encounter by our practice on 07/10/2018. Ms. Alexa  has a past medical history of Anxiety, Arthritis, Asthma, Chronic pain, Chronic pain syndrome (04/10/2015), CPAP (continuous positive airway pressure) dependence, Degenerative arthritis of hip (04/10/2015), Depression, Diaphragmatic hernia (04/10/2015), Family history of adverse reaction to anesthesia, Food poisoning (09/27/2017), GERD  (gastroesophageal reflux disease), Headache, Hearing loss, Hiatal hernia, Hypertension, Migraines, Neuromuscular disorder ([place]), Osteoporosis, Reflux, Shortness of breath dyspnea, Sleep apnea, and Vision loss. She also  has a past surgical history that includes Inner ear surgery (Bilateral); Dilation and curettage of uterus; Esophagogastroduodenoscopy (egd) with propofol (N/A, 05/08/2017); and Thyroidectomy (N/A, 05/28/2018). Kristina Gomez has a current medication list which includes the following prescription(s): acetaminophen, buspirone, calcitriol, calcitriol, calcium-vitamin d, combivent respimat, cyclobenzaprine, dexilant, diazepam, doxepin, famotidine, famotidine, fluticasone, furosemide, gabapentin, hydroxypropyl methylcellulose / hypromellose, hydroxyzine, ipratropium, levothyroxine, liothyronine, lisinopril-hydrochlorothiazide, mirabegron er, mirtazapine, multivitamin, ondansetron, os-cal calcium + d3, oxybutynin, oxycodone, oxycodone, oxycodone, potassium chloride, prazosin, senna-docusate, and symbicort. She  reports that she quit smoking about 20 years ago. She has never used smokeless tobacco. She reports that she does not drink alcohol or use drugs. Kristina Gomez is allergic to contrast media [iodinated diagnostic agents]; iodine; other; and sulfa antibiotics.   HPI  I last saw her on 07/10/2018. She is being evaluated for medication management. She rates her pain a 7/10. She has back pain that goes down her legs. 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 She admits that extra she does work like bending. If she works on her home for 4 hours then she has to sit and cry in pain. She feels like that her pain has gotten worse since her cancer. She is also having neck and back pain. The pain goes down both legs into her feet. She will see Dr Milinda Pointer for injections in his feet. She admits that she has flare ups she make take a few extra Oxycodone but she is aware that this has to last. She admits that on those days that she  10/10 pain. She is interested in possibly trying injections again in the future. She states that  her biggest concern is weight gain.   Pharmacotherapy Assessment  Analgesic:Oxycodone IR 5 mg every 6 hours (20 mg/day) MME/day:30 mg/day   Monitoring: Pharmacotherapy: No side-effects or adverse reactions reported. [place] PMP: PDMP not reviewed this encounter.       Compliance: No problems identified. Plan: Refer to "POC".  Review of recent tests  NM Whole Body I131 Scan S/P Ca Rx CLINICAL DATA:  Thyroid cancer post thyroidectomy  EXAM: NUCLEAR MEDICINE I-131 POST THERAPY WHOLE BODY SCAN  TECHNIQUE: The patient received 71.52 mCi I-131 sodium iodide for the treatment of thyroid cancer within the past 10 days. The patient returns today, and whole body scanning was performed in the anterior and posterior projections.  COMPARISON:  None  FINDINGS: Abnormal radio iodine accumulation is seen in the cervical region bilaterally, more inferiorly on the LEFT likely thyroid bed and more superiorly bilaterally greater on RIGHT question lymph node.  Subtle uptake on the LEFT more superiorly could represent lymph node submandibular gland.  No additional sites of abnormal radio iodine accumulation are seen to suggest distant metastatic disease.  Expected excretion of tracer in the colon and urinary bladder.  IMPRESSION: Uptake in the cervical region on LEFT likely thyroid bed and on RIGHT greater than LEFT, favor RIGHT lymph node, question LEFT lymph node versus submandibular salivary uptake of tracer.  No distant iodine avid metastatic foci are identified.  Electronically Signed   By: Lavonia Dana M.D.   On: 08/10/2018 [facility]   Hospital Outpatient Visit on 07/15/2018  Component Date Value Ref Range Status  . Calcium 07/15/2018 8.2* 8.9 - 10.3 mg/dL Final   Performed at [facility], [address], [place], [place] [address]   Assessment  The primary encounter diagnosis  was Lumbar spondylosis. Diagnoses of Cervical spondylosis (Degenerative changes most prominent C4-5 and C5-6), Musculoskeletal pain, Chronic pain syndrome, Muscle cramps at night (Left leg), and Neurogenic pain were also pertinent to this visit.  Plan of Care  I have changed Mechele Claude A. Wyss's oxyCODONE, oxyCODONE, and oxyCODONE. I am also having her maintain her furosemide, acetaminophen, mirabegron ER, potassium chloride, multivitamin, lisinopril-hydrochlorothiazide, famotidine, fluticasone, Symbicort, prazosin, hydroxypropyl methylcellulose / hypromellose, doxepin, ondansetron, calcium-vitamin D, senna-docusate, hydrOXYzine, calcitRIOL, oxybutynin, busPIRone, calcitRIOL, Os-Cal Calcium + D3, Dexilant, diazepam, ipratropium, famotidine, Combivent Respimat, levothyroxine, liothyronine, mirtazapine, cyclobenzaprine, and gabapentin.  Pharmacotherapy (Medications Ordered): Meds ordered this encounter  Medications  . cyclobenzaprine (FLEXERIL) 10 MG tablet    Sig: Take 1 tablet (10 mg total) by mouth 3 (three) times daily as needed for muscle spasms.    Dispense:  90 tablet    Refill:  2    Do not add this medication to the electronic "Automatic Refill" notification system. Patient may have prescription filled one day early if pharmacy is closed on scheduled refill date.    Order Specific Question:   Supervising Provider    Answer:   Milinda Pointer [phone]  . gabapentin (NEURONTIN) 300 MG capsule    Sig: Take 1 capsule (300 mg total) by mouth 4 (four) times daily. Follow written titration schedule.    Dispense:  120 capsule    Refill:  2    Do not place this medication, or any other prescription from our practice, on "Automatic Refill". Patient may have prescription filled one day early if pharmacy is closed on scheduled refill date.    Order Specific Question:   Supervising Provider    Answer:   Milinda Pointer [phone]  . oxyCODONE (OXY IR/ROXICODONE) 5 MG immediate  release tablet    Sig:  Take 1 tablet (5 mg total) by mouth every 6 (six) hours as needed for up to 30 days for severe pain.    Dispense:  120 tablet    Refill:  0    Do not place this medication, or any other prescription from our practice, on "Automatic Refill". Patient may have prescription filled one day early if pharmacy is closed on scheduled refill date.    Order Specific Question:   Supervising Provider    Answer:   Milinda Pointer [phone]  . oxyCODONE (OXY IR/ROXICODONE) 5 MG immediate release tablet    Sig: Take 1 tablet (5 mg total) by mouth every 6 (six) hours as needed for up to 30 days for severe pain.    Dispense:  120 tablet    Refill:  0    Do not place this medication, or any other prescription from our practice, on "Automatic Refill". Patient may have prescription filled one day early if pharmacy is closed on scheduled refill date.    Order Specific Question:   Supervising Provider    Answer:   Milinda Pointer [phone]  . oxyCODONE (OXY IR/ROXICODONE) 5 MG immediate release tablet    Sig: Take 1 tablet (5 mg total) by mouth every 6 (six) hours as needed for up to 30 days for severe pain.    Dispense:  120 tablet    Refill:  0    Do not place this medication, or any other prescription from our practice, on "Automatic Refill". Patient may have prescription filled one day early if pharmacy is closed on scheduled refill date.    Order Specific Question:   Supervising Provider    Answer:   Milinda Pointer [phone]   Orders:  No orders of the defined types were placed in this encounter.  Follow-up plan:   Return in about 3 months (around 12/14/2018) for MedMgmt.   I discussed the assessment and treatment plan with the patient. The patient was provided an opportunity to ask questions and all were answered. The patient agreed with the plan and demonstrated an understanding of the instructions.  Patient advised to call back or seek an in-person evaluation if the symptoms or condition  worsens.  Total duration of non-face-to-face encounter: 15 minutes.  Note by: Dionisio David, NP Date: 09/14/2018; Time: 11:24 AM  Disclaimer:  * Given the special circumstances of the COVID-19 pandemic, the federal government has announced that the Office for Civil Rights (OCR) will exercise its enforcement discretion and will not impose penalties on physicians using telehealth in the event of noncompliance with regulatory requirements under the [place] (HIPAA) in connection with the good faith provision of telehealth during the OIZTI-45 national public health emergency. ([place])

## 2018-09-16 ENCOUNTER — Ambulatory Visit: Payer: Medicare Other | Attending: Pain Medicine | Admitting: Pain Medicine

## 2018-09-16 ENCOUNTER — Ambulatory Visit: Payer: Medicare Other | Admitting: Podiatry

## 2018-09-16 ENCOUNTER — Other Ambulatory Visit: Payer: Self-pay

## 2018-09-16 DIAGNOSIS — G894 Chronic pain syndrome: Secondary | ICD-10-CM

## 2018-09-16 NOTE — Progress Notes (Signed)
Pain Management Encounter Note - Virtual Visit via Telephone Telehealth (real-time audio visits between healthcare provider and patient).  Patient's Phone No. & Preferred Pharmacy:  [phone] (home); [phone] (mobile); (Preferred) [phone]  [id number] - [place], [place] - [address] [place] [address] Phone: [phone] Fax: [phone]   Pre-screening note:  Our staff contacted Ms. Kasal and offered her an "in person", "face-to-face" appointment versus a telephone encounter. She indicated preferring the telephone encounter, at this time.  Reason for Virtual Visit: COVID-19*  Social distancing based on CDC and AMA recommendations.   I initiated contact with Nada Boozer on 09/16/2018 at 8:23 AM by telephone, but I get no answer.  I then attempted to contact the second number, but nobody answered and the recording stated that this phone belonged to somebody else other than the patient.  Note by: Gaspar Cola, MD Date: 09/16/2018; Time: 8:40 AM

## 2018-09-17 ENCOUNTER — Other Ambulatory Visit: Payer: Self-pay

## 2018-09-17 ENCOUNTER — Encounter: Payer: Self-pay | Admitting: Podiatry

## 2018-09-17 ENCOUNTER — Ambulatory Visit (INDEPENDENT_AMBULATORY_CARE_PROVIDER_SITE_OTHER): Payer: Medicare Other

## 2018-09-17 ENCOUNTER — Ambulatory Visit (INDEPENDENT_AMBULATORY_CARE_PROVIDER_SITE_OTHER): Payer: Medicare Other | Admitting: Podiatry

## 2018-09-17 VITALS — Temp 97.9°F

## 2018-09-17 DIAGNOSIS — S93602D Unspecified sprain of left foot, subsequent encounter: Secondary | ICD-10-CM

## 2018-09-17 DIAGNOSIS — S92325A Nondisplaced fracture of second metatarsal bone, left foot, initial encounter for closed fracture: Secondary | ICD-10-CM | POA: Diagnosis not present

## 2018-09-17 NOTE — Progress Notes (Signed)
She presents today stating that she is here for follow-up of a possible sprain or fracture to her left foot.  States that she tripped over her exercise machine just the other day and she has been wearing her boot since.  Objective: Vital signs stable alert oriented x3 there is no erythema ecchymosis swelling drainage or odor no open lesions or wounds no sign of occult injury.  She has pain on palpation near the base of the second metatarsal.  Radiographs taken today do demonstrate some area of periosteal fluffing consistent with a possible cortical interruption stress fracture.  Assessment: Stress fracture base second metatarsal.  Plan: Continue use of the cam walker follow-up with me in 1 month to 6 weeks.

## 2018-09-23 ENCOUNTER — Ambulatory Visit: Payer: Medicare Other | Admitting: Podiatry

## 2018-10-26 ENCOUNTER — Ambulatory Visit: Payer: Medicare Other | Admitting: Urology

## 2018-11-12 IMAGING — MG MM DIGITAL DIAGNOSTIC BILAT W/ TOMO W/ CAD
8 of 15 series · 8 of 31 positions shown · non-contrast
Comparison: Previous exam(s).

ACR Breast Density Category a: The breast tissue is almost entirely
fatty.

CLINICAL DATA: 51-year-old patient due for annual examination. She
palpates an asymmetric area of firmness in the superior left axilla
and has intermittent pain in the left axilla.

EXAM:
2D DIGITAL DIAGNOSTIC BILATERAL MAMMOGRAM WITH CAD AND ADJUNCT TOMO
ULTRASOUND LEFT BREAST

[L XCCM]
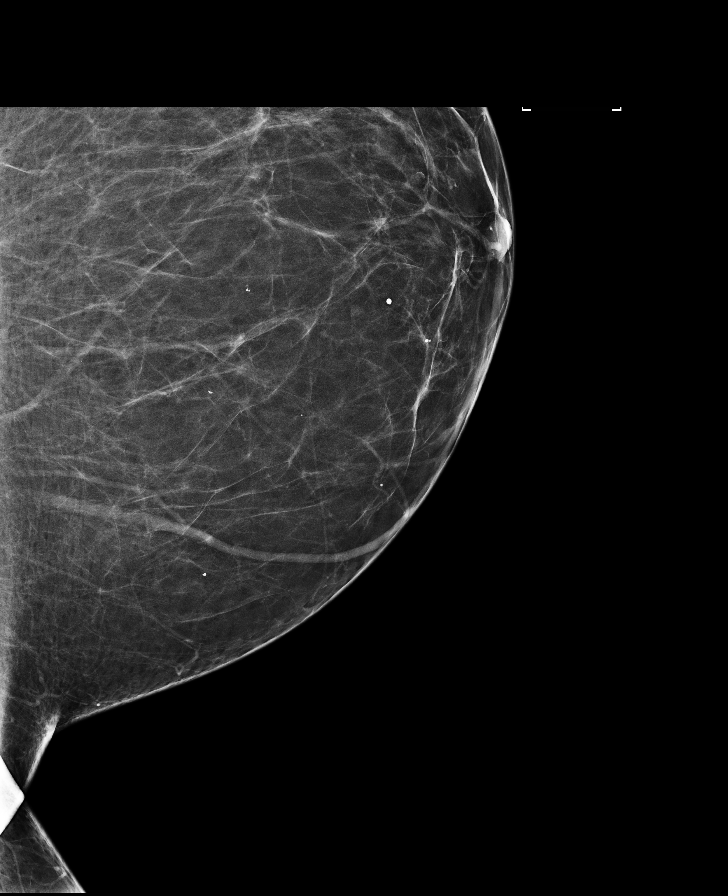

[R CC (1 of 2)]
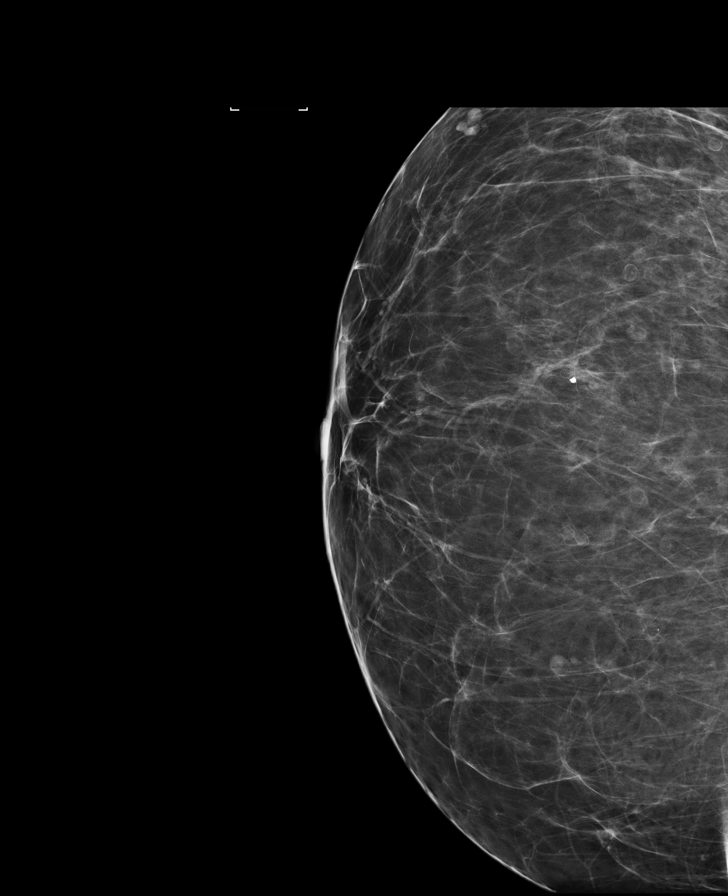

[L MLO (1 of 2)]
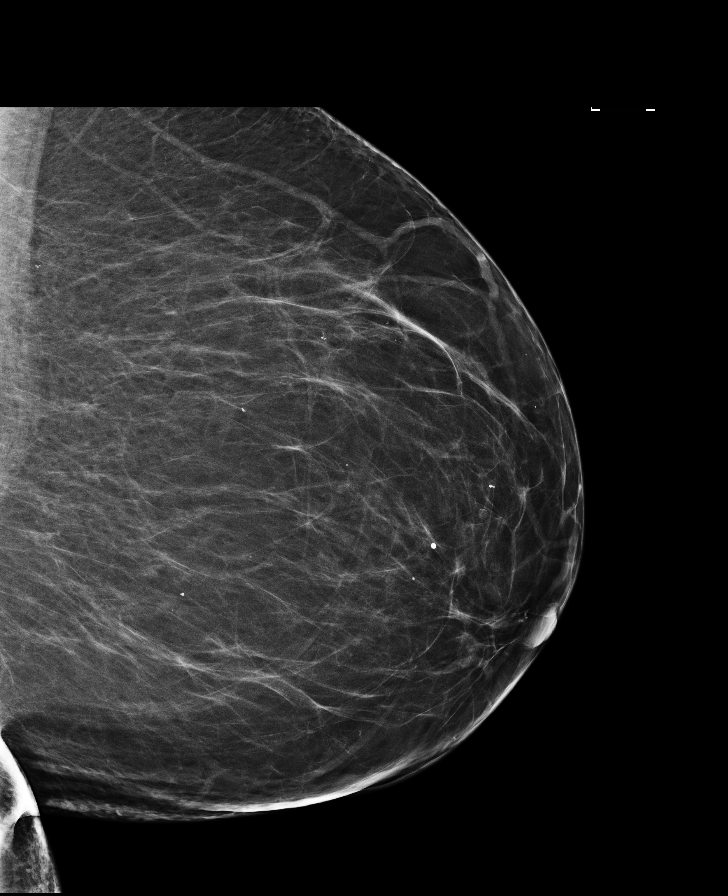

[R CC (2 of 2)]
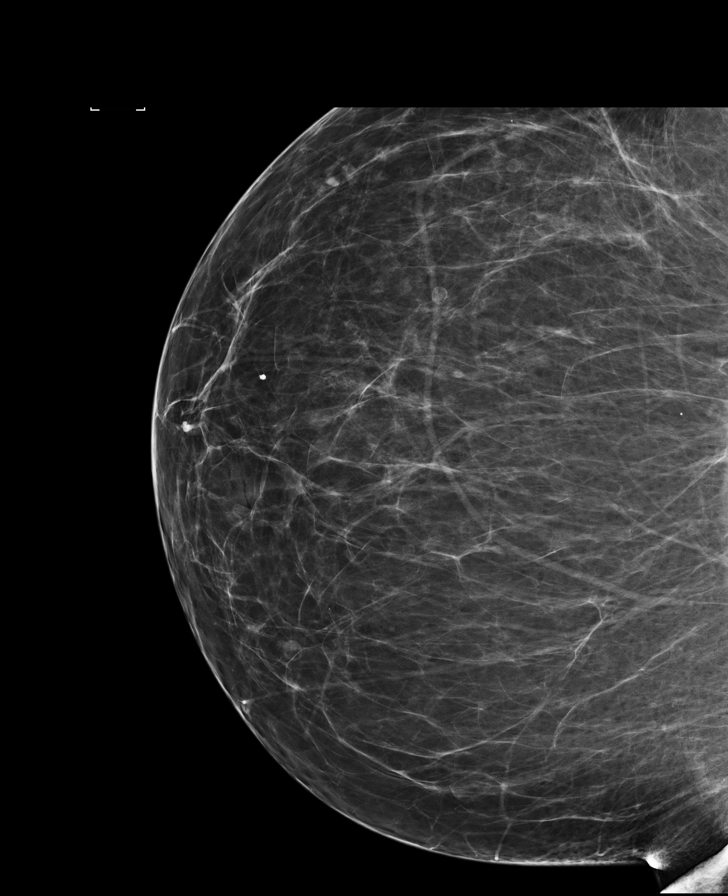

[L MLO (2 of 2)]
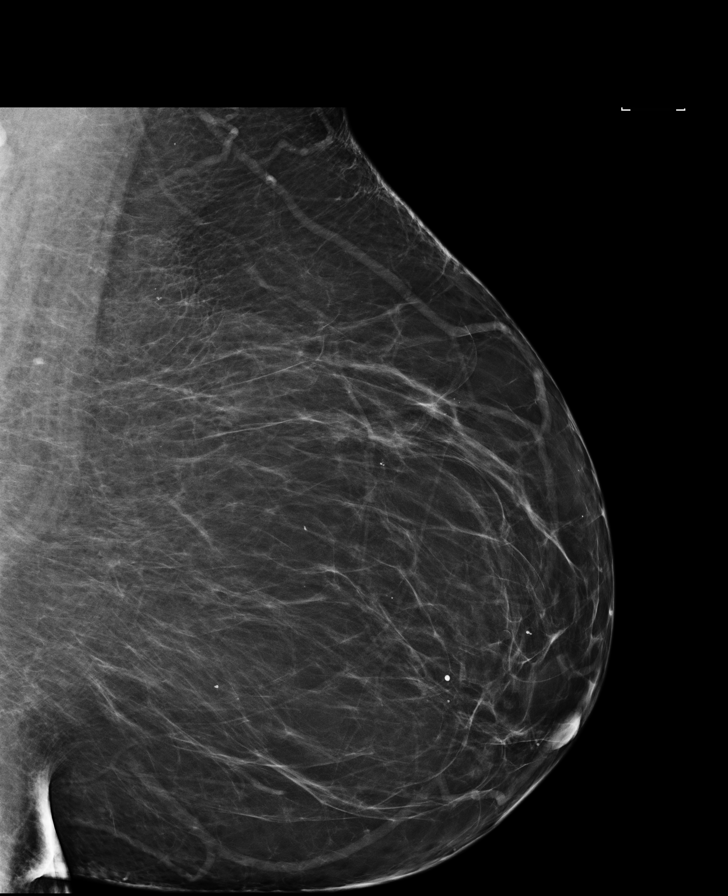

[R MLO]
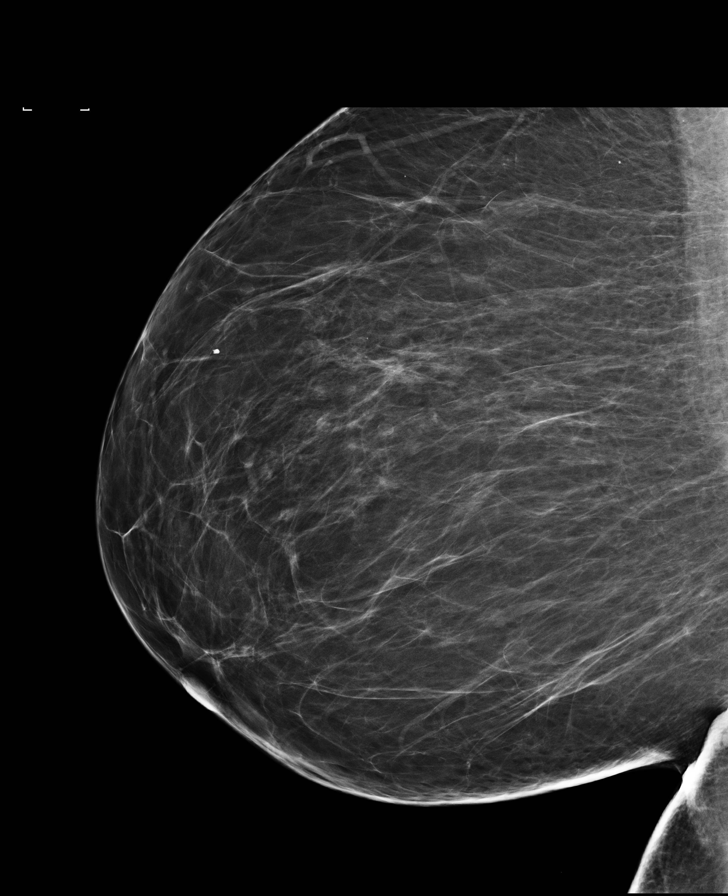

[R MLO synth-2D]
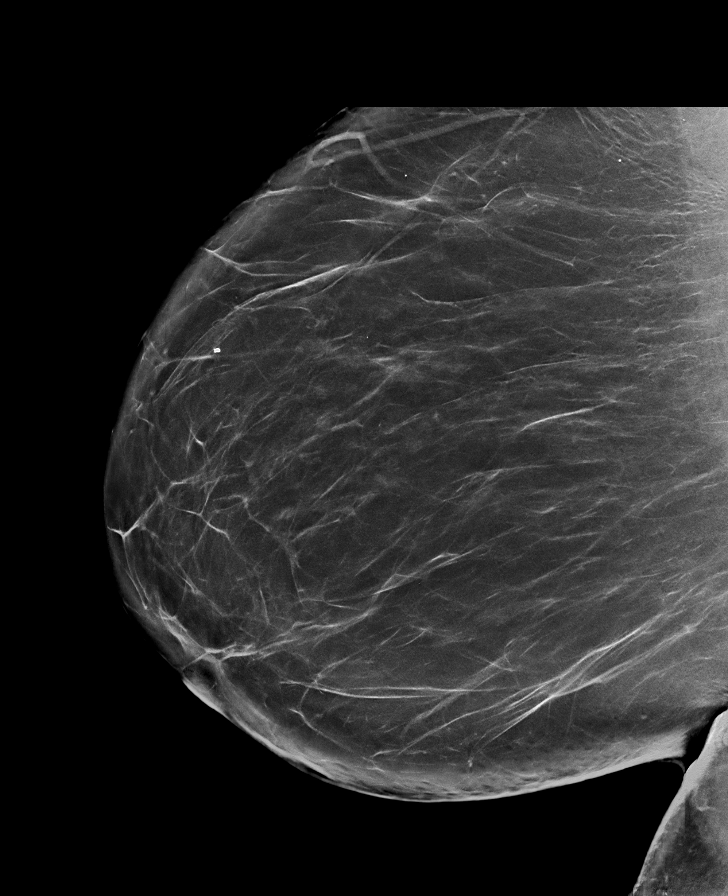

[R CC synth-2D]
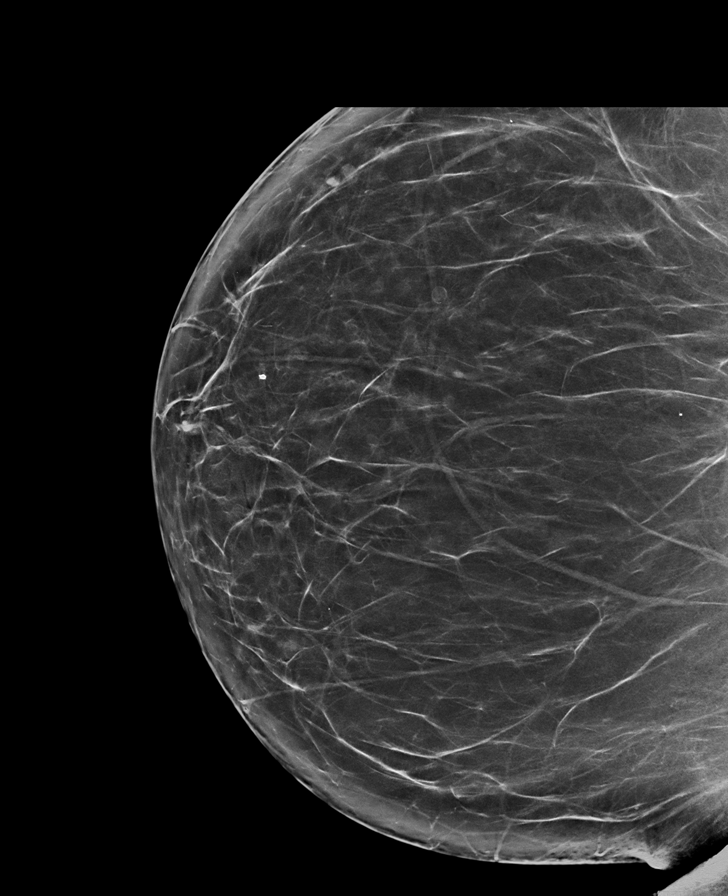

[8 of 31 positions shown; findings below may reference images not displayed]

FINDINGS: No suspicious mass, distortion, or suspicious microcalcification is
identified in either breast to suggest malignancy.

Mammographic images were processed with CAD.

On physical exam, I do not palpate a mass or enlarged lymph node in
the left axilla.

Targeted ultrasound is performed, showing normal muscle in the
superior left axilla in the region of patient concern, likely
accounting for the area of firmness reported by the patient. Normal
left axillary lymph nodes are imaged. There is no lymphadenopathy,
mass, or fluid collection in the left axilla.
IMPRESSION: No evidence of malignancy in either breast. Normal sonographic
appearance of the left axilla.

RECOMMENDATION:
Screening mammogram in one year.(Code:QG-6-3XA)

I have discussed the findings and recommendations with the patient.
Results were also provided in writing at the conclusion of the
visit. If applicable, a reminder letter will be sent to the patient
regarding the next appointment.

BI-RADS CATEGORY  1: Negative.

## 2018-12-07 ENCOUNTER — Ambulatory Visit: Payer: Medicare Other | Admitting: Urology

## 2018-12-22 ENCOUNTER — Telehealth: Payer: Self-pay

## 2018-12-22 ENCOUNTER — Encounter: Payer: Self-pay | Admitting: Pain Medicine

## 2018-12-22 DIAGNOSIS — M899 Disorder of bone, unspecified: Secondary | ICD-10-CM | POA: Insufficient documentation

## 2018-12-22 DIAGNOSIS — M7918 Myalgia, other site: Secondary | ICD-10-CM | POA: Insufficient documentation

## 2018-12-22 DIAGNOSIS — G8929 Other chronic pain: Secondary | ICD-10-CM | POA: Insufficient documentation

## 2018-12-22 DIAGNOSIS — Z79899 Other long term (current) drug therapy: Secondary | ICD-10-CM | POA: Insufficient documentation

## 2018-12-22 DIAGNOSIS — Z789 Other specified health status: Secondary | ICD-10-CM | POA: Insufficient documentation

## 2018-12-22 NOTE — Telephone Encounter (Signed)
Attempted to call patient for the second time.  Tried home and cell number again.  Mailbox was full and I was unable to leave message.

## 2018-12-22 NOTE — Progress Notes (Signed)
Attempted Pain Management Virtual Visit via Telephone   Patient's Phone No. & Preferred Pharmacy:  [phone] (home); [phone] (mobile); (Preferred) [phone] [email or web address]   I attempted to contact Kristina Gomez on 12/23/2018 via telephone. I was unable to complete the visit due to no one answering the phone, or unable to get a proper contact number. I was also unable to leave a message   Pharmacotherapy Assessment  Analgesic: Oxycodone IR 5 mg every 6 hours (20 mg/day).  The patient obtained some hydrocodone on 11/24/2018 from Damita Lack, NP, according to the PMP. MME/day:30 mg/day.    Follow-up plan:   Return in about 14 weeks (around 03/31/2019) for (VV), E/M (MM).     Considering:   Left L5-S1 transforaminalepidural steroid injection Right L4-5 transforaminalepidural steroid injection Left diagnostic cervical epiduralsteroid injection Diagnostic bilateral cervical facet block Possible bilateral cervical facet radiofrequencyablation Diagnostic bilateralintra-articular knee injection Diagnostic bilateral genicular nerve block Possible bilateral genicular nerve radiofrequencyablation Diagnostic bilateral lumbar facet block Possible bilateral lumbar facet radiofrequencyablation Diagnostic bilateral sacroiliac joint block Possible bilateral sacroiliac joint radiofrequencyablation Diagnostic bilateralintra-articular hip joint injection Possible bilateral hip radiofrequency ablation Diagnostic bilateralintra-articular shoulder joint injection Diagnostic bilateral suprascapular nerve block Possible bilateral suprascapular nerve radiofrequencyablation   Palliative PRN treatment(s):   Palliative left L4-5 LESI #3  Palliative left L5-S1 interlaminar LESI #2     Note by: Gaspar Cola, MD Date: 12/23/2018; Time: 5:19 AM

## 2018-12-23 ENCOUNTER — Ambulatory Visit: Payer: Medicare Other | Attending: Pain Medicine | Admitting: Pain Medicine

## 2018-12-23 ENCOUNTER — Other Ambulatory Visit: Payer: Self-pay

## 2018-12-23 ENCOUNTER — Telehealth: Payer: Self-pay | Admitting: *Deleted

## 2018-12-23 DIAGNOSIS — Z79899 Other long term (current) drug therapy: Secondary | ICD-10-CM

## 2018-12-23 DIAGNOSIS — M5442 Lumbago with sciatica, left side: Secondary | ICD-10-CM

## 2018-12-23 DIAGNOSIS — M5441 Lumbago with sciatica, right side: Secondary | ICD-10-CM

## 2018-12-23 DIAGNOSIS — G894 Chronic pain syndrome: Secondary | ICD-10-CM

## 2018-12-23 DIAGNOSIS — M7918 Myalgia, other site: Secondary | ICD-10-CM

## 2018-12-23 DIAGNOSIS — Z789 Other specified health status: Secondary | ICD-10-CM

## 2018-12-23 DIAGNOSIS — R252 Cramp and spasm: Secondary | ICD-10-CM

## 2018-12-23 DIAGNOSIS — E559 Vitamin D deficiency, unspecified: Secondary | ICD-10-CM

## 2018-12-23 DIAGNOSIS — M792 Neuralgia and neuritis, unspecified: Secondary | ICD-10-CM

## 2018-12-23 DIAGNOSIS — M47816 Spondylosis without myelopathy or radiculopathy, lumbar region: Secondary | ICD-10-CM

## 2018-12-23 DIAGNOSIS — M797 Fibromyalgia: Secondary | ICD-10-CM

## 2018-12-23 DIAGNOSIS — M79605 Pain in left leg: Secondary | ICD-10-CM

## 2018-12-23 DIAGNOSIS — G8929 Other chronic pain: Secondary | ICD-10-CM

## 2018-12-23 DIAGNOSIS — M899 Disorder of bone, unspecified: Secondary | ICD-10-CM

## 2018-12-23 DIAGNOSIS — M79604 Pain in right leg: Secondary | ICD-10-CM

## 2018-12-28 ENCOUNTER — Other Ambulatory Visit: Payer: Self-pay

## 2018-12-28 ENCOUNTER — Ambulatory Visit: Payer: Medicare Other | Attending: Pain Medicine | Admitting: Pain Medicine

## 2018-12-28 ENCOUNTER — Telehealth: Payer: Self-pay | Admitting: *Deleted

## 2018-12-28 DIAGNOSIS — M5442 Lumbago with sciatica, left side: Secondary | ICD-10-CM

## 2018-12-28 DIAGNOSIS — M792 Neuralgia and neuritis, unspecified: Secondary | ICD-10-CM

## 2018-12-28 DIAGNOSIS — M79605 Pain in left leg: Secondary | ICD-10-CM

## 2018-12-28 DIAGNOSIS — M5441 Lumbago with sciatica, right side: Secondary | ICD-10-CM

## 2018-12-28 DIAGNOSIS — R252 Cramp and spasm: Secondary | ICD-10-CM

## 2018-12-28 DIAGNOSIS — M25552 Pain in left hip: Secondary | ICD-10-CM

## 2018-12-28 DIAGNOSIS — G894 Chronic pain syndrome: Secondary | ICD-10-CM

## 2018-12-28 DIAGNOSIS — M79604 Pain in right leg: Secondary | ICD-10-CM

## 2018-12-28 DIAGNOSIS — G8929 Other chronic pain: Secondary | ICD-10-CM

## 2018-12-28 DIAGNOSIS — M7918 Myalgia, other site: Secondary | ICD-10-CM

## 2018-12-28 NOTE — Telephone Encounter (Signed)
Attempted to call for pre appointment assessment. Home phone rang several times, no voicemail. Mobile phone was that of someone else not the patient, no message left.

## 2018-12-28 NOTE — Telephone Encounter (Signed)
Attempted again to call for pre appointment assessment. No answer after several rings. No voicemail.

## 2018-12-28 NOTE — Progress Notes (Signed)
I attempted to contact Kristina Gomez on 12/28/2018 via telephone. I was unable to complete the visit due to no one answering the phone, or unable to get a proper contact number. I was also unable to leave a message   Pharmacotherapy Assessment  Analgesic: Oxycodone IR 5 mg every 6 hours (20 mg/day).  The patient obtained some hydrocodone on 11/24/2018 from Damita Lack, NP, according to the PMP. MME/day:30 mg/day.     Considering:   Left L5-S1 transforaminalepidural steroid injection Right L4-5 transforaminalepidural steroid injection Left diagnostic cervical epiduralsteroid injection Diagnostic bilateral cervical facet block Possible bilateral cervical facet radiofrequencyablation Diagnostic bilateralintra-articular knee injection Diagnostic bilateral genicular nerve block Possible bilateral genicular nerve radiofrequencyablation Diagnostic bilateral lumbar facet block Possible bilateral lumbar facet radiofrequencyablation Diagnostic bilateral sacroiliac joint block Possible bilateral sacroiliac joint radiofrequencyablation Diagnostic bilateralintra-articular hip joint injection Possible bilateral hip radiofrequency ablation Diagnostic bilateralintra-articular shoulder joint injection Diagnostic bilateral suprascapular nerve block Possible bilateral suprascapular nerve radiofrequencyablation   Palliative PRN treatment(s):   Palliative left L4-5 LESI #3  Palliative left L5-S1 interlaminar LESI #2      Note by: Gaspar Cola, MD Date: 12/28/2018; Time: 1:55 PM

## 2019-01-04 ENCOUNTER — Ambulatory Visit: Payer: Medicare Other | Admitting: Urology

## 2019-01-12 NOTE — Progress Notes (Signed)
Pain Management Virtual Encounter Note - Virtual Visit via Telephone Telehealth (real-time audio visits between healthcare provider and patient).   Patient's Phone No. & Preferred Pharmacy:  [phone] (home); [phone] (mobile); (Preferred) [phone] [email or web address]  [id number] - [place], [place] - [address] [address] Phone: [phone] Fax: [phone]    Pre-screening note:  Our staff contacted Kristina Gomez and offered her an "in person", "face-to-face" appointment versus a telephone encounter. She indicated preferring the telephone encounter, at this time.   Reason for Virtual Visit: COVID-19*  Social distancing based on CDC and AMA recommendations.   I contacted Kristina Gomez on 01/13/2019 via telephone.      I clearly identified myself as Gaspar Cola, MD. I verified that I was speaking with the correct person using two identifiers (Name: Kristina Gomez, and date of birth: September 19, 1965).  Advanced Informed Consent I sought verbal advanced consent from Kristina Gomez for virtual visit interactions. I informed Kristina Gomez of possible security and privacy concerns, risks, and limitations associated with providing "not-in-person" medical evaluation and management services. I also informed Kristina Gomez of the availability of "in-person" appointments. Finally, I informed her that there would be a charge for the virtual visit and that she could be  personally, fully or partially, financially responsible for it. Kristina Gomez expressed understanding and agreed to proceed.   Historic Elements   Kristina Gomez is a 53 y.o. year old, female patient evaluated today after her last encounter by our practice on 12/28/2018. Kristina Gomez  has a past medical history of Anxiety, Arthritis, Asthma, Chronic pain, Chronic pain syndrome (04/10/2015), CPAP (continuous positive airway pressure) dependence, Degenerative arthritis of hip (04/10/2015), Depression,  Diaphragmatic hernia (04/10/2015), Family history of adverse reaction to anesthesia, Food poisoning (09/27/2017), GERD (gastroesophageal reflux disease), Headache, Hearing loss, Hiatal hernia, Hypertension, Migraines, Neuromuscular disorder ([place]), Osteoporosis, Reflux, Shortness of breath dyspnea, Sleep apnea, and Vision loss. She also  has a past surgical history that includes Inner ear surgery (Bilateral); Dilation and curettage of uterus; Esophagogastroduodenoscopy (egd) with propofol (N/A, 05/08/2017); and Thyroidectomy (N/A, 05/28/2018). Kristina Gomez has a current medication list which includes the following prescription(s): acetaminophen, buspirone, calcitriol, calcitriol, calcium-vitamin d, citalopram, combivent respimat, cyclobenzaprine, dexilant, diazepam, doxepin, famotidine, famotidine, fluticasone, furosemide, gabapentin, hydroxypropyl methylcellulose / hypromellose, hydroxyzine, ipratropium, levothyroxine, levothyroxine, liothyronine, lisinopril-hydrochlorothiazide, mirabegron er, mirtazapine, multivitamin, ondansetron, os-cal calcium + d3, oxybutynin, oxycodone, oxycodone, oxycodone, pantoprazole, potassium chloride, prazosin, senna-docusate, and symbicort. She  reports that she quit smoking about 21 years ago. She has never used smokeless tobacco. She reports that she does not drink alcohol or use drugs. Kristina Gomez is allergic to contrast media [iodinated diagnostic agents]; iodine; other; and sulfa antibiotics.   HPI  Today, she is being contacted for medication management.  The patient indicates doing well on her current medication regimen.  Apparently her insurance company is not paying for the gabapentin 300 mg pills but they will further 400 mg pill.  Today we will be switching her from the 300 to 400 mg pill.  In addition, the patient reports an increase in her right shoulder pain for which she had to go to the urgent care and she did tell me that she received some additional pain medicine there.   She has a history of a prior fracture of the right shoulder and she went to a physician that gave her a shot in the right shoulder, which did not help.  In reviewing  her chart, I see that she has an MRI of the cervical spine with evidence of cervical spinal stenosis.  This is happening around the area of C4-5 and C5-6.  It is very likely that this may be the etiology of her upper extremity pain.  She is complaining that the pain is worse in the shoulder blade goes down her right arm all the way to the fingers suggesting a radiculitis.  Because of this, I will be scheduling the patient to come in for a right-sided cervical epidural steroid injection under fluoroscopic guidance and IV sedation.  Should she continue to have problems with this, then we will go ahead and order an MRI of the cervical spine to check on the possibility of her cervical pathology progressing.  If it turns out that her spinal stenosis has gotten worse and she does not respond to the cervical epidural steroid injection, pain we may need to get a neurosurgical referral for surgical decompression evaluation.  Pharmacotherapy Assessment  Analgesic: Oxycodone IR 5, 1 tab PO q 6 hrs (20 mg/day of oxycodone) (has enough to last until 04/13/2019) MME/day:30 mg/day.   Monitoring: Pharmacotherapy: No side-effects or adverse reactions reported. [place] PMP: PDMP not reviewed this encounter.       Compliance: No problems identified. Effectiveness: Clinically acceptable. Plan: Refer to "POC".  UDS:  Summary  Date Value Ref Range Status  03/17/2018 FINAL  Final    Comment:    ==================================================================== TOXASSURE SELECT 13 (MW) ==================================================================== Test                             Result       Flag       Units Drug Present and Declared for Prescription Verification   Oxycodone                      350          EXPECTED   ng/mg creat   Oxymorphone                     1203         EXPECTED   ng/mg creat   Noroxycodone                   1320         EXPECTED   ng/mg creat   Noroxymorphone                 580          EXPECTED   ng/mg creat    Sources of oxycodone are scheduled prescription medications.    Oxymorphone, noroxycodone, and noroxymorphone are expected    metabolites of oxycodone. Oxymorphone is also available as a    scheduled prescription medication. ==================================================================== Test                      Result    Flag   Units      Ref Range   Creatinine              30               mg/dL      >=20 ==================================================================== Declared Medications:  The flagging and interpretation on this report are based on the  following declared medications.  Unexpected results may arise from  inaccuracies in the declared medications.  **Note: The testing scope  of this panel includes these medications:  Oxycodone  **Note: The testing scope of this panel does not include following  reported medications:  Acetaminophen (Tylenol)  Albuterol (Ipratropium-Albuterol)  Cholecalciferol  Cyclobenzaprine  Furosemide  Gabapentin  Ipratropium (Ipratropium-Albuterol)  Lisinopril  Meloxicam  Mirabegron  Multivitamin  Ofloxaxin  Pantoprazole (Protonix)  Potassium  Trazodone  Zolpidem ==================================================================== For clinical consultation, please call [phone]. ====================================================================    Laboratory Chemistry Profile (12 mo)  Renal: No results found for requested labs within last 8760 hours.  Hepatic: 06/02/2018: Albumin 3.7 Other: No results found for requested labs within last 8760 hours. Note: Above Lab results reviewed.  Imaging  Last 90 days:  No results found. Last Hospital Admission:  Nm Whole Body I131 Scan S/p Ca Rx  Result Date: 08/10/2018 CLINICAL DATA:   Thyroid cancer post thyroidectomy EXAM: NUCLEAR MEDICINE I-131 POST THERAPY WHOLE BODY SCAN TECHNIQUE: The patient received 71.52 mCi I-131 sodium iodide for the treatment of thyroid cancer within the past 10 days. The patient returns today, and whole body scanning was performed in the anterior and posterior projections. COMPARISON:  None FINDINGS: Abnormal radio iodine accumulation is seen in the cervical region bilaterally, more inferiorly on the LEFT likely thyroid bed and more superiorly bilaterally greater on RIGHT question lymph node. Subtle uptake on the LEFT more superiorly could represent lymph node submandibular gland. No additional sites of abnormal radio iodine accumulation are seen to suggest distant metastatic disease. Expected excretion of tracer in the colon and urinary bladder. IMPRESSION: Uptake in the cervical region on LEFT likely thyroid bed and on RIGHT greater than LEFT, favor RIGHT lymph node, question LEFT lymph node versus submandibular salivary uptake of tracer. No distant iodine avid metastatic foci are identified. Electronically Signed   By: Lavonia Dana M.D.   On: 08/10/2018 09:22   Assessment  The primary encounter diagnosis was Chronic pain syndrome. Diagnoses of Chronic lower extremity pain (Primary Area of Pain) (Bilateral) (L>R), Chronic low back pain (Secondary area of Pain) (Bilateral) (L>R) w/ sciatica (Bilateral) (L>R), Chronic hip pain (Third area of Pain) (Bilateral) (L>R), Musculoskeletal pain, Muscle cramps at night (Left leg), Neurogenic pain, DDD (degenerative disc disease), cervical, Cervicalgia (Bilateral) (R>L), Chronic upper extremity pain (Bilateral) (R>L), Radicular pain of shoulder (Bilateral) (R>L), Cervical radiculitis (Right), Pharmacologic therapy, Disorder of skeletal system, Problems influencing health status, and Vitamin D insufficiency were also pertinent to this visit.  Plan of Care  I have changed Kristina Gomez's oxyCODONE and gabapentin. I am also  having her start on oxyCODONE and oxyCODONE. Additionally, I am having her maintain her furosemide, acetaminophen, mirabegron ER, potassium chloride, multivitamin, lisinopril-hydrochlorothiazide, famotidine, fluticasone, Symbicort, prazosin, hydroxypropyl methylcellulose / hypromellose, doxepin, ondansetron, calcium-vitamin D, senna-docusate, hydrOXYzine, calcitRIOL, oxybutynin, busPIRone, calcitRIOL, Os-Cal Calcium + D3, Dexilant, diazepam, ipratropium, famotidine, Combivent Respimat, levothyroxine, liothyronine, mirtazapine, pantoprazole, levothyroxine, citalopram, and cyclobenzaprine.  Pharmacotherapy (Medications Ordered): Meds ordered this encounter  Medications  . oxyCODONE (OXY IR/ROXICODONE) 5 MG immediate release tablet    Sig: Take 1 tablet (5 mg total) by mouth every 6 (six) hours as needed for severe pain. Must last 30 days    Dispense:  120 tablet    Refill:  0    Chronic Pain: STOP Act (Not applicable) Fill 1 day early if closed on refill date. Do not fill until: 01/13/2019. To last until: 02/12/2019. Avoid benzodiazepines within 8 hours of opioids  . cyclobenzaprine (FLEXERIL) 10 MG tablet    Sig: Take 1 tablet (10 mg total) by mouth 3 (  three) times daily as needed for muscle spasms.    Dispense:  90 tablet    Refill:  2    Fill one day early if pharmacy is closed on scheduled refill date. May substitute for generic if available.  . gabapentin (NEURONTIN) 400 MG capsule    Sig: Take 1 capsule (400 mg total) by mouth 4 (four) times daily. Follow written titration schedule.    Dispense:  120 capsule    Refill:  2    Fill one day early if pharmacy is closed on scheduled refill date. May substitute for generic if available.  Marland Kitchen oxyCODONE (OXY IR/ROXICODONE) 5 MG immediate release tablet    Sig: Take 1 tablet (5 mg total) by mouth every 6 (six) hours as needed for severe pain. Must last 30 days    Dispense:  120 tablet    Refill:  0    Chronic Pain: STOP Act (Not applicable) Fill 1 day  early if closed on refill date. Do not fill until: 02/12/2019. To last until: 03/14/2019. Avoid benzodiazepines within 8 hours of opioids  . oxyCODONE (OXY IR/ROXICODONE) 5 MG immediate release tablet    Sig: Take 1 tablet (5 mg total) by mouth every 6 (six) hours as needed for severe pain. Must last 30 days    Dispense:  120 tablet    Refill:  0    Chronic Pain: STOP Act (Not applicable) Fill 1 day early if closed on refill date. Do not fill until: 03/14/2019. To last until: 04/13/2019. Avoid benzodiazepines within 8 hours of opioids   Orders:  Orders Placed This Encounter  Procedures  . Cervical Epidural Injection    Level(s): C7-T1 Laterality: Right-sided Purpose: Diagnostic Indication(s): Radiculitis and cervicalgia associater with cervical degenerative disc disease.    Standing Status:   Future    Standing Expiration Date:   02/13/2019    Scheduling Instructions:     Procedure: Cervical Epidural Steroid Injection/Block     Sedation: With Sedation.     Timeframe: As soon as schedule allows    Order Specific Question:   Where will this procedure be performed?    Answer:   [facility]    Comments:   by Dr. Dossie Arbour  . ToxASSURE Select 13 (MW), Urine    Volume: 30 ml(s). Minimum 3 ml of urine is needed. Document temperature of fresh sample. Indications: Long term (current) use of opiate analgesic (Z79.891)  . Comp. Metabolic Panel (12)    With GFR. Indications: Chronic Pain Syndrome (G89.4) & Pharmacotherapy (Z66.063)    Order Specific Question:   Has the patient fasted?    Answer:   No    Order Specific Question:   CC Results    Answer:   PCP-NURSE [[id number]]  . Magnesium    Indication: Pharmacologic therapy (X32.355)    Order Specific Question:   CC Results    Answer:   PCP-NURSE [[id number]]  . Vitamin B12    Indication: Pharmacologic therapy (R42.706).    Order Specific Question:   CC Results    Answer:   PCP-NURSE [[id number]]  . Sedimentation rate    Indication: Disorder of  skeletal system (M89.9)    Order Specific Question:   CC Results    Answer:   PCP-NURSE [[id number]]  . 25-Hydroxyvitamin D Lcms D2+D3    Indication: Disorder of skeletal system (M89.9).    Order Specific Question:   CC Results    Answer:   PCP-NURSE [[id number]]  . C-reactive protein  Indication: Problems influencing health status (Z78.9)    Order Specific Question:   CC Results    Answer:   PCP-NURSE [[id number]]   Follow-up plan:   Return in about 3 months (around 04/12/2019) for (VV), E/M (MM), in addition, Procedure (w/ sedation): (R) CESI #1, (ASAP).      Considering:   Left L5-S1 transforaminalepidural steroid injection Right L4-5 transforaminalepidural steroid injection Left diagnostic cervical epiduralsteroid injection Diagnostic bilateral cervical facet block Possible bilateral cervical facet radiofrequencyablation Diagnostic bilateralintra-articular knee injection Diagnostic bilateral genicular nerve block Possible bilateral genicular nerve radiofrequencyablation Diagnostic bilateral lumbar facet block Possible bilateral lumbar facet radiofrequencyablation Diagnostic bilateral sacroiliac joint block Possible bilateral sacroiliac joint radiofrequencyablation Diagnostic bilateralintra-articular hip joint injection Possible bilateral hip radiofrequency ablation Diagnostic bilateralintra-articular shoulder joint injection Diagnostic bilateral suprascapular nerve block Possible bilateral suprascapular nerve radiofrequencyablation   Palliative PRN treatment(s):   Palliative left L4-5 LESI #3  Palliative left L5-S1 interlaminar LESI #2       Recent Visits Date Type Provider Dept  12/23/18 Office Visit Milinda Pointer, MD [facility]  Showing recent visits within past 90 days and meeting all other requirements   Today's Visits Date Type Provider Dept  01/13/19 Office Visit Milinda Pointer, MD [facility]  Showing today's visits and meeting  all other requirements   Future Appointments Date Type Provider Dept  03/17/19 Appointment Milinda Pointer, MD [facility]  Showing future appointments within next 90 days and meeting all other requirements   I discussed the assessment and treatment plan with the patient. The patient was provided an opportunity to ask questions and all were answered. The patient agreed with the plan and demonstrated an understanding of the instructions.  Patient advised to call back or seek an in-person evaluation if the symptoms or condition worsens.  Total duration of non-face-to-face encounter: 22 minutes.  Note by: Gaspar Cola, MD Date: 01/13/2019; Time: 11:59 AM  Note: This dictation was prepared with Dragon dictation. Any transcriptional errors that may result from this process are unintentional.  Disclaimer:  * Given the special circumstances of the COVID-19 pandemic, the federal government has announced that the Office for Civil Rights (OCR) will exercise its enforcement discretion and will not impose penalties on physicians using telehealth in the event of noncompliance with regulatory requirements under the [place] (HIPAA) in connection with the good faith provision of telehealth during the CBJSE-83 national public health emergency. ([place])

## 2019-01-13 ENCOUNTER — Other Ambulatory Visit: Payer: Self-pay

## 2019-01-13 ENCOUNTER — Ambulatory Visit: Payer: Medicare Other | Attending: Pain Medicine | Admitting: Pain Medicine

## 2019-01-13 DIAGNOSIS — M5441 Lumbago with sciatica, right side: Secondary | ICD-10-CM

## 2019-01-13 DIAGNOSIS — M25552 Pain in left hip: Secondary | ICD-10-CM

## 2019-01-13 DIAGNOSIS — G894 Chronic pain syndrome: Secondary | ICD-10-CM | POA: Diagnosis not present

## 2019-01-13 DIAGNOSIS — M5442 Lumbago with sciatica, left side: Secondary | ICD-10-CM | POA: Diagnosis not present

## 2019-01-13 DIAGNOSIS — M79604 Pain in right leg: Secondary | ICD-10-CM | POA: Diagnosis not present

## 2019-01-13 DIAGNOSIS — M79605 Pain in left leg: Secondary | ICD-10-CM

## 2019-01-13 DIAGNOSIS — R252 Cramp and spasm: Secondary | ICD-10-CM

## 2019-01-13 DIAGNOSIS — M79601 Pain in right arm: Secondary | ICD-10-CM

## 2019-01-13 DIAGNOSIS — M542 Cervicalgia: Secondary | ICD-10-CM

## 2019-01-13 DIAGNOSIS — M792 Neuralgia and neuritis, unspecified: Secondary | ICD-10-CM

## 2019-01-13 DIAGNOSIS — M7918 Myalgia, other site: Secondary | ICD-10-CM

## 2019-01-13 DIAGNOSIS — M5412 Radiculopathy, cervical region: Secondary | ICD-10-CM

## 2019-01-13 DIAGNOSIS — M79602 Pain in left arm: Secondary | ICD-10-CM

## 2019-01-13 DIAGNOSIS — Z789 Other specified health status: Secondary | ICD-10-CM

## 2019-01-13 DIAGNOSIS — M899 Disorder of bone, unspecified: Secondary | ICD-10-CM

## 2019-01-13 DIAGNOSIS — E559 Vitamin D deficiency, unspecified: Secondary | ICD-10-CM

## 2019-01-13 DIAGNOSIS — M5030 Other cervical disc degeneration, unspecified cervical region: Secondary | ICD-10-CM

## 2019-01-13 DIAGNOSIS — G8929 Other chronic pain: Secondary | ICD-10-CM

## 2019-01-13 DIAGNOSIS — Z79899 Other long term (current) drug therapy: Secondary | ICD-10-CM

## 2019-01-13 MED ORDER — GABAPENTIN 400 MG PO CAPS: 400.0000 mg | ORAL_CAPSULE | Freq: Four times a day (QID) | ORAL | 2 refills | Status: DC

## 2019-01-13 MED ORDER — OXYCODONE HCL 5 MG PO TABS: 5.0000 mg | ORAL_TABLET | Freq: Four times a day (QID) | ORAL | 0 refills | Status: DC | PRN

## 2019-01-13 MED ORDER — CYCLOBENZAPRINE HCL 10 MG PO TABS: 10.0000 mg | ORAL_TABLET | Freq: Three times a day (TID) | ORAL | 2 refills | Status: DC | PRN

## 2019-01-13 NOTE — Patient Instructions (Signed)

## 2019-01-13 NOTE — Telephone Encounter (Signed)
Attempted to call patient for pre appointment questionairi.  Unable to leava a message.

## 2019-02-05 ENCOUNTER — Other Ambulatory Visit: Payer: Self-pay | Admitting: Sports Medicine

## 2019-02-05 DIAGNOSIS — M19011 Primary osteoarthritis, right shoulder: Secondary | ICD-10-CM

## 2019-02-05 DIAGNOSIS — M7541 Impingement syndrome of right shoulder: Secondary | ICD-10-CM

## 2019-02-05 DIAGNOSIS — G8929 Other chronic pain: Secondary | ICD-10-CM

## 2019-02-05 DIAGNOSIS — M25511 Pain in right shoulder: Secondary | ICD-10-CM

## 2019-02-09 ENCOUNTER — Other Ambulatory Visit: Payer: Self-pay

## 2019-02-09 ENCOUNTER — Ambulatory Visit
Admission: RE | Admit: 2019-02-09 | Discharge: 2019-02-09 | Disposition: A | Discharge status: Home / Self Care | Payer: Medicare Other | Source: Ambulatory Visit | Attending: Pain Medicine | Admitting: Pain Medicine

## 2019-02-09 ENCOUNTER — Encounter: Payer: Self-pay | Admitting: Pain Medicine

## 2019-02-09 ENCOUNTER — Ambulatory Visit (HOSPITAL_BASED_OUTPATIENT_CLINIC_OR_DEPARTMENT_OTHER): Payer: Medicare Other | Admitting: Pain Medicine

## 2019-02-09 VITALS — BP 107/76 | HR 105 | Temp 98.3°F | Resp 18 | Ht 67.0 in | Wt 265.0 lb

## 2019-02-09 DIAGNOSIS — M5030 Other cervical disc degeneration, unspecified cervical region: Secondary | ICD-10-CM | POA: Insufficient documentation

## 2019-02-09 DIAGNOSIS — M542 Cervicalgia: Secondary | ICD-10-CM | POA: Diagnosis present

## 2019-02-09 DIAGNOSIS — M47812 Spondylosis without myelopathy or radiculopathy, cervical region: Secondary | ICD-10-CM

## 2019-02-09 DIAGNOSIS — M5412 Radiculopathy, cervical region: Secondary | ICD-10-CM | POA: Insufficient documentation

## 2019-02-09 DIAGNOSIS — Z91041 Radiographic dye allergy status: Secondary | ICD-10-CM | POA: Diagnosis present

## 2019-02-09 MED ORDER — FENTANYL CITRATE (PF) 100 MCG/2ML IJ SOLN: 25.0000 ug | INTRAMUSCULAR | Status: DC | PRN

## 2019-02-09 MED ORDER — DEXAMETHASONE SODIUM PHOSPHATE 10 MG/ML IJ SOLN
10.0000 mg | Freq: Once | INTRAMUSCULAR | Status: AC
  Administered 2019-02-09: 10 mg
  Filled 2019-02-09: qty 1

## 2019-02-09 MED ORDER — LIDOCAINE HCL 2 % IJ SOLN
20.0000 mL | Freq: Once | INTRAMUSCULAR | Status: AC
  Administered 2019-02-09: 400 mg
  Filled 2019-02-09: qty 40

## 2019-02-09 MED ORDER — MIDAZOLAM HCL 5 MG/5ML IJ SOLN: 1.0000 mg | INTRAMUSCULAR | Status: DC | PRN

## 2019-02-09 MED ORDER — SODIUM CHLORIDE 0.9% FLUSH
1.0000 mL | Freq: Once | INTRAVENOUS | Status: AC
  Administered 2019-02-09: 1 mL

## 2019-02-09 MED ORDER — ROPIVACAINE HCL 2 MG/ML IJ SOLN
1.0000 mL | Freq: Once | INTRAMUSCULAR | Status: AC
  Administered 2019-02-09: 09:00:00 1 mL via EPIDURAL
  Filled 2019-02-09: qty 10

## 2019-02-09 MED ORDER — LACTATED RINGERS IV SOLN: 1000.0000 mL | Freq: Once | INTRAVENOUS | Status: DC

## 2019-02-09 NOTE — Patient Instructions (Signed)

## 2019-02-09 NOTE — Progress Notes (Signed)
Patient's Name: Kristina Gomez  MRN: [id number]  Referring Provider: Frazier Richards, MD  DOB: 08/28/1965  PCP: Frazier Richards, MD  DOS: 02/09/2019  Note by: Gaspar Cola, MD  Service setting: Ambulatory outpatient  Specialty: Interventional Pain Management  Patient type: Established  Location: [facility] (AMB) Pain Management Facility  Visit type: Interventional Procedure   Primary Reason for Visit: Interventional Pain Management Treatment. CC: Neck Pain  Procedure:          Anesthesia, Analgesia, Anxiolysis:  Type: Diagnostic, Inter-Laminar, Cervical Epidural Steroid Injection  #1  Region: Posterior Cervico-thoracic Region Level: C7-T1 Laterality: Right-Sided Paramedial  Type: Local Anesthesia Indication(s): Analgesia         Route: Infiltration (/IM) IV Access: Declined Sedation: Declined  Local Anesthetic: Lidocaine 1-2%  Position: Prone with head of the table was raised to facilitate breathing.   Indications: 1. Cervical radiculitis (Right)   2. Cervical spondylosis (Degenerative changes most prominent C4-5 and C5-6)   3. Cervicalgia (Bilateral) (R>L)   4. DDD (degenerative disc disease), cervical   5. Allergy to radiographic dye    Pain Score: Pre-procedure: 10-Worst pain ever/10 Post-procedure: 7 /10   Pre-op Assessment:  Kristina Gomez is a 53 y.o. (year old), female patient, seen today for interventional treatment. She  has a past surgical history that includes Inner ear surgery (Bilateral); Dilation and curettage of uterus; Esophagogastroduodenoscopy (egd) with propofol (N/A, 05/08/2017); and Thyroidectomy (N/A, 05/28/2018). Kristina Gomez has a current medication list which includes the following prescription(s): acetaminophen, buspirone, calcitriol, calcitriol, citalopram, combivent respimat, cyclobenzaprine, dexilant, diazepam, doxepin, famotidine, famotidine, fluticasone, gabapentin, hydroxypropyl methylcellulose / hypromellose, hydroxyzine, ipratropium, levothyroxine, levothyroxine,  liothyronine, lisinopril-hydrochlorothiazide, mirabegron er, mirtazapine, multivitamin, ondansetron, os-cal calcium + d3, oxybutynin, oxycodone, oxycodone, oxycodone, pantoprazole, potassium chloride, prazosin, senna-docusate, symbicort, calcium-vitamin d, and furosemide. Her primarily concern today is the Neck Pain  Initial Vital Signs:  Pulse/HCG Rate: (!) 105ECG Heart Rate: 85 Temp: 98.3 F (36.8 C) Resp: 16 BP: 108/70 SpO2: 100 %  BMI: Estimated body mass index is 41.5 kg/m as calculated from the following:   Height as of this encounter: 5\' 7"  (1.702 m).   Weight as of this encounter: 265 lb (120.2 kg).  Risk Assessment: Allergies: Reviewed. She is allergic to contrast media [iodinated diagnostic agents]; iodine; other; and sulfa antibiotics.  Allergy Precautions: None required Coagulopathies: Reviewed. None identified.  Blood-thinner therapy: None at this time Active Infection(s): Reviewed. None identified. Kristina Gomez is afebrile  Site Confirmation: Kristina Gomez was asked to confirm the procedure and laterality before marking the site Procedure checklist: Completed Consent: Before the procedure and under the influence of no sedative(s), amnesic(s), or anxiolytics, the patient was informed of the treatment options, risks and possible complications. To fulfill our ethical and legal obligations, as recommended by the American Medical Association's Code of Ethics, I have informed the patient of my clinical impression; the nature and purpose of the treatment or procedure; the risks, benefits, and possible complications of the intervention; the alternatives, including doing nothing; the risk(s) and benefit(s) of the alternative treatment(s) or procedure(s); and the risk(s) and benefit(s) of doing nothing. The patient was provided information about the general risks and possible complications associated with the procedure. These may include, but are not limited to: failure to achieve desired goals,  infection, bleeding, organ or nerve damage, allergic reactions, paralysis, and death. In addition, the patient was informed of those risks and complications associated to Spine-related procedures, such as failure to decrease pain; infection (i.e.: Meningitis, epidural or  intraspinal abscess); bleeding (i.e.: epidural hematoma, subarachnoid hemorrhage, or any other type of intraspinal or peri-dural bleeding); organ or nerve damage (i.e.: Any type of peripheral nerve, nerve root, or spinal cord injury) with subsequent damage to sensory, motor, and/or autonomic systems, resulting in permanent pain, numbness, and/or weakness of one or several areas of the body; allergic reactions; (i.e.: anaphylactic reaction); and/or death. Furthermore, the patient was informed of those risks and complications associated with the medications. These include, but are not limited to: allergic reactions (i.e.: anaphylactic or anaphylactoid reaction(s)); adrenal axis suppression; blood sugar elevation that in diabetics may result in ketoacidosis or comma; water retention that in patients with history of congestive heart failure may result in shortness of breath, pulmonary edema, and decompensation with resultant heart failure; weight gain; swelling or edema; medication-induced neural toxicity; particulate matter embolism and blood vessel occlusion with resultant organ, and/or nervous system infarction; and/or aseptic necrosis of one or more joints. Finally, the patient was informed that Medicine is not an exact science; therefore, there is also the possibility of unforeseen or unpredictable risks and/or possible complications that may result in a catastrophic outcome. The patient indicated having understood very clearly. We have given the patient no guarantees and we have made no promises. Enough time was given to the patient to ask questions, all of which were answered to the patient's satisfaction. Kristina Gomez has indicated that she  wanted to continue with the procedure. Attestation: I, the ordering provider, attest that I have discussed with the patient the benefits, risks, side-effects, alternatives, likelihood of achieving goals, and potential problems during recovery for the procedure that I have provided informed consent. Date  Time: 02/09/2019  8:27 AM  Pre-Procedure Preparation:  Monitoring: As per clinic protocol. Respiration, ETCO2, SpO2, BP, heart rate and rhythm monitor placed and checked for adequate function Safety Precautions: Patient was assessed for positional comfort and pressure points before starting the procedure. Time-out: I initiated and conducted the "Time-out" before starting the procedure, as per protocol. The patient was asked to participate by confirming the accuracy of the "Time Out" information. Verification of the correct person, site, and procedure were performed and confirmed by me, the nursing staff, and the patient. "Time-out" conducted as per Joint Commission's Universal Protocol (UP.01.01.01). Time: 0907  Description of Procedure:          Target Area: For Epidural Steroid injections the target is the interlaminar space, initially targeting the lower border of the superior vertebral body lamina. Approach: Paramedial approach. Area Prepped: Entire PosteriorCervical Region Prepping solution: DuraPrep (Iodine Povacrylex [0.7% available iodine] and Isopropyl Alcohol, 74% w/w) Safety Precautions: Aspiration looking for blood return was conducted prior to all injections. At no point did we inject any substances, as a needle was being advanced. No attempts were made at seeking any paresthesias. Safe injection practices and needle disposal techniques used. Medications properly checked for expiration dates. SDV (single dose vial) medications used. Description of the Procedure: Protocol guidelines were followed. The procedure needle was introduced through the skin, ipsilateral to the reported pain, and  advanced to the target area. Bone was contacted and the needle walked caudad, until the lamina was cleared. The epidural space was identified using "loss-of-resistance technique" with 2-3 ml of PF-NaCl (0.9% NSS), in a 5cc LOR glass syringe. Vitals:   02/09/19 0826 02/09/19 0904 02/09/19 0911 02/09/19 0918  BP: 108/70 111/71 (!) 118/58 107/76  Pulse: (!) 105     Resp: 16 20 19 18   Temp: 98.3 F (36.8 C)  SpO2: 100% 97% 97% 98%  Weight: 265 lb (120.2 kg)     Height: 5\' 7"  (1.702 m)       Start Time: 0907 hrs. End Time: 0913 hrs. Materials:  Needle(s) Type: Epidural needle Gauge: 17G Length: 3.5-in Medication(s): Please see orders for medications and dosing details.  Imaging Guidance (Spinal):          Type of Imaging Technique: Fluoroscopy Guidance (Spinal) Indication(s): Assistance in needle guidance and placement for procedures requiring needle placement in or near specific anatomical locations not easily accessible without such assistance. Exposure Time: Please see nurses notes. Contrast: Before injecting any contrast, we confirmed that the patient did not have an allergy to iodine, shellfish, or radiological contrast. Once satisfactory needle placement was completed at the desired level, radiological contrast was injected. Contrast injected under live fluoroscopy. No contrast complications. See chart for type and volume of contrast used. Fluoroscopic Guidance: I was personally present during the use of fluoroscopy. "Tunnel Vision Technique" used to obtain the best possible view of the target area. Parallax error corrected before commencing the procedure. "Direction-depth-direction" technique used to introduce the needle under continuous pulsed fluoroscopy. Once target was reached, antero-posterior, oblique, and lateral fluoroscopic projection used confirm needle placement in all planes. Images permanently stored in EMR. Interpretation: I personally interpreted the imaging  intraoperatively. Adequate needle placement confirmed in multiple planes. Appropriate spread of contrast into desired area was observed. No evidence of afferent or efferent intravascular uptake. No intrathecal or subarachnoid spread observed. Permanent images saved into the patient's record.  Antibiotic Prophylaxis:   Anti-infectives (From admission, onward)   None     Indication(s): None identified  Post-operative Assessment:  Post-procedure Vital Signs:  Pulse/HCG Rate: (!) 10580 Temp: 98.3 F (36.8 C) Resp: 18 BP: 107/76 SpO2: 98 %  EBL: None  Complications: No immediate post-treatment complications observed by team, or reported by patient.  Note: The patient tolerated the entire procedure well. A repeat set of vitals were taken after the procedure and the patient was kept under observation following institutional policy, for this type of procedure. Post-procedural neurological assessment was performed, showing return to baseline, prior to discharge. The patient was provided with post-procedure discharge instructions, including a section on how to identify potential problems. Should any problems arise concerning this procedure, the patient was given instructions to immediately contact us, at any time, without hesitation. In any case, we plan to contact the patient by telephone for a follow-up status report regarding this interventional procedure.  Comments:  No additional relevant information.  Plan of Care  Orders:  Orders Placed This Encounter  Procedures  . Cervical Epidural Injection    Procedure: Cervical Epidural Steroid Injection/Block Purpose: Diagnostic Indication(s): Radiculitis and cervicalgia associater with cervical degenerative disc disease.    Scheduling Instructions:     Level(s): C7-T1     Laterality: Right-sided     Sedation: Patient's choice.     Timeframe: Today    Order Specific Question:   Where will this procedure be performed?    Answer:   [facility]    Comments:   by Dr. Dossie Arbour  . [facility] C-ARM 1-60 MIN NO REPORT    Intraoperative interpretation by procedural physician at [place].    Standing Status:   Standing    Number of Occurrences:   1    Order Specific Question:   Reason for exam:    Answer:   Assistance in needle guidance and placement for procedures  requiring needle placement in or near specific anatomical locations not easily accessible without such assistance.  . Provider attestation of informed consent for procedure/surgical case    I, the ordering provider, attest that I have discussed with the patient the benefits, risks, side effects, alternatives, likelihood of achieving goals and potential problems during recovery for the procedure that I have provided informed consent.    Standing Status:   Standing    Number of Occurrences:   1  . Informed Consent Details: Transcribe to consent form and obtain patient signature    Standing Status:   Standing    Number of Occurrences:   1    Order Specific Question:   Procedure    Answer:   Cervical epidural steroid injection under fluoroscopic guidance. (See notes for level and laterality.)    Order Specific Question:   Surgeon    Answer:   Elliona Doddridge A. Dossie Arbour, MD    Order Specific Question:   Indication/Reason    Answer:   Neck pain and/or upper extremity pain secondary to cervical radiculitis/radiculopathy.  . Miscellanous precautions    Standing Status:   Standing    Number of Occurrences:   1   Chronic Opioid Analgesic:  Oxycodone IR 5, 1 tab PO q 6 hrs (20 mg/day of oxycodone) (has enough to last until 04/13/2019) MME/day:30 mg/day.   Medications ordered for procedure: Meds ordered this encounter  Medications  . lidocaine (XYLOCAINE) 2 % (with pres) injection 400 mg  . DISCONTD: lactated ringers infusion 1,000 mL  . DISCONTD: midazolam (VERSED) 5 MG/5ML injection 1-2 mg    Make sure Flumazenil is available in the pyxis when using this  medication. If oversedation occurs, administer 0.2 mg IV over 15 sec. If after 45 sec no response, administer 0.2 mg again over 1 min; may repeat at 1 min intervals; not to exceed 4 doses (1 mg)  . DISCONTD: fentaNYL (SUBLIMAZE) injection 25-50 mcg    Make sure Narcan is available in the pyxis when using this medication. In the event of respiratory depression (RR< 8/min): Titrate NARCAN (naloxone) in increments of 0.1 to 0.2 mg IV at 2-3 minute intervals, until desired degree of reversal.  . sodium chloride flush (NS) 0.9 % injection 1 mL  . ropivacaine (PF) 2 mg/mL (0.2%) (NAROPIN) injection 1 mL  . dexamethasone (DECADRON) injection 10 mg   Medications administered: We administered lidocaine, sodium chloride flush, ropivacaine (PF) 2 mg/mL (0.2%), and dexamethasone.  See the medical record for exact dosing, route, and time of administration.  Follow-up plan:   Return in about 2 weeks (around 02/23/2019) for (VV), (PP).       Considering:   Left L5-S1 transforaminalepidural steroid injection Right L4-5 transforaminalepidural steroid injection Left diagnostic cervical epiduralsteroid injection Diagnostic bilateral cervical facet block Possible bilateral cervical facet radiofrequencyablation Diagnostic bilateralintra-articular knee injection Diagnostic bilateral genicular nerve block Possible bilateral genicular nerve radiofrequencyablation Diagnostic bilateral lumbar facet block Possible bilateral lumbar facet radiofrequencyablation Diagnostic bilateral sacroiliac joint block Possible bilateral sacroiliac joint radiofrequencyablation Diagnostic bilateralintra-articular hip joint injection Possible bilateral hip radiofrequency ablation Diagnostic bilateralintra-articular shoulder joint injection Diagnostic bilateral suprascapular nerve block Possible bilateral suprascapular nerve radiofrequencyablation   Palliative PRN treatment(s):   Diagnostic/[place] right CESI #2   Palliative left L4-5 LESI #3  Palliative left L5-S1 interlaminar LESI #2        Recent Visits Date Type Provider Dept  01/13/19 Office Visit Milinda Pointer, MD [facility]  12/23/18 Office Visit Milinda Pointer, [facility]  Showing recent visits within past 90 days and meeting all other requirements   Today's Visits Date Type Provider Dept  02/09/19 Procedure visit Milinda Pointer, MD [facility]  Showing today's visits and meeting all other requirements   Future Appointments Date Type Provider Dept  03/02/19 Appointment Milinda Pointer, MD [facility]  Showing future appointments within next 90 days and meeting all other requirements   Disposition: Discharge home  Discharge Date & Time: 02/09/2019; 0930 hrs.   Primary Care Physician: Frazier Richards, MD Location: [facility] Note by: Gaspar Cola, MD Date: 02/09/2019; Time: 9:44 AM  Disclaimer:  Medicine is not an Chief Strategy Officer. The only guarantee in medicine is that nothing is guaranteed. It is important to note that the decision to proceed with this intervention was based on the information collected from the patient. The Data and conclusions were drawn from the patient's questionnaire, the interview, and the physical examination. Because the information was provided in large part by the patient, it cannot be guaranteed that it has not been purposely or unconsciously manipulated. Every effort has been made to obtain as much relevant data as possible for this evaluation. It is important to note that the conclusions that lead to this procedure are derived in large part from the available data. Always take into account that the treatment will also be dependent on availability of resources and existing treatment guidelines, considered by other Pain Management Practitioners as being common knowledge and practice, at the time of the intervention. For  Medico-Legal purposes, it is also important to point out that variation in procedural techniques and pharmacological choices are the acceptable norm. The indications, contraindications, technique, and results of the above procedure should only be interpreted and judged by a Board-Certified Interventional Pain Specialist with extensive familiarity and expertise in the same exact procedure and technique.

## 2019-02-09 NOTE — Progress Notes (Signed)
Safety precautions to be maintained throughout the outpatient stay will include: orient to surroundings, keep bed in low position, maintain call bell within reach at all times, provide assistance with transfer out of bed and ambulation. Safety precautions to be maintained throughout the outpatient stay will include: orient to surroundings, keep bed in low position, maintain call bell within reach at all times, provide assistance with transfer out of bed and ambulation.  

## 2019-02-10 ENCOUNTER — Telehealth: Payer: Self-pay

## 2019-02-10 NOTE — Telephone Encounter (Signed)
Post procedure phone call.  LM 

## 2019-02-24 ENCOUNTER — Telehealth: Payer: Self-pay | Admitting: *Deleted

## 2019-02-24 NOTE — Telephone Encounter (Signed)
Tried to call and the "call could not be completed at this time". No way to leave VM.

## 2019-02-25 NOTE — Telephone Encounter (Signed)
Patient will be Kristina Gomez, Kristina Gomez and needs to get meds  She needs Oxycodone [facility] [place], [address] [place], [place]

## 2019-02-25 NOTE — Telephone Encounter (Signed)
Attempted phone [phone], received message, "call cannot be completed at this time" Attempted pone [phone], received message, "this number is not in service."

## 2019-03-01 ENCOUNTER — Telehealth: Payer: Self-pay | Admitting: *Deleted

## 2019-03-01 NOTE — Telephone Encounter (Signed)
Spoke with patient and she is in [place] with daughter and will be moving to [facility] soon.  States she will need to reschedule appt from tomorrow til another time.

## 2019-03-01 NOTE — Progress Notes (Deleted)
Pain Management Virtual Encounter Note - Virtual Visit via Telephone Telehealth (real-time audio visits between healthcare provider and patient).   Patient's Phone No. & Preferred Pharmacy:  [phone] (home); [phone] (mobile); (Preferred) [phone] [email or web address]  [id number] - [place], [place] - [address] [address] Phone: [phone] Fax: [phone]    Pre-screening note:  Our staff contacted Kristina Gomez and offered her an "in person", "face-to-face" appointment versus a telephone encounter. She indicated preferring the telephone encounter, at this time.   Reason for Virtual Visit: COVID-19*  Social distancing based on CDC and AMA recommendations.   I contacted Kristina Gomez on 03/02/2019 via telephone.      I clearly identified myself as Gaspar Cola, MD. I verified that I was speaking with the correct person using two identifiers (Name: Kristina Gomez, and date of birth: [birth year]).  Advanced Informed Consent I sought verbal advanced consent from Kristina Gomez for virtual visit interactions. I informed Kristina Gomez of possible security and privacy concerns, risks, and limitations associated with providing "not-in-person" medical evaluation and management services. I also informed Kristina Gomez of the availability of "in-person" appointments. Finally, I informed her that there would be a charge for the virtual visit and that she could be  personally, fully or partially, financially responsible for it. Kristina Gomez expressed understanding and agreed to proceed.   Historic Elements   Kristina Gomez is a 53 y.o. year old, female patient evaluated today after her last encounter by our practice on 03/01/2019. Kristina Gomez  has a past medical history of Anxiety, Arthritis, Asthma, Chronic pain, Chronic pain syndrome (04/10/2015), CPAP (continuous positive airway pressure) dependence, Degenerative arthritis of hip (04/10/2015), Depression,  Diaphragmatic hernia (04/10/2015), Family history of adverse reaction to anesthesia, Food poisoning (09/27/2017), GERD (gastroesophageal reflux disease), Headache, Hearing loss, Hiatal hernia, Hypertension, Migraines, Neuromuscular disorder ([place]), Osteoporosis, Reflux, Shortness of breath dyspnea, Sleep apnea, and Vision loss. She also  has a past surgical history that includes Inner ear surgery (Bilateral); Dilation and curettage of uterus; Esophagogastroduodenoscopy (egd) with propofol (N/A, 05/08/2017); and Thyroidectomy (N/A, 05/28/2018). Kristina Gomez has a current medication list which includes the following prescription(s): acetaminophen, buspirone, calcitriol, calcitriol, calcium-vitamin d, citalopram, combivent respimat, cyclobenzaprine, dexilant, diazepam, doxepin, famotidine, famotidine, fluticasone, furosemide, gabapentin, hydroxypropyl methylcellulose / hypromellose, hydroxyzine, ipratropium, levothyroxine, levothyroxine, liothyronine, lisinopril-hydrochlorothiazide, mirabegron er, mirtazapine, multivitamin, ondansetron, os-cal calcium + d3, oxybutynin, oxycodone, oxycodone, oxycodone, pantoprazole, potassium chloride, prazosin, senna-docusate, and symbicort. She  reports that she quit smoking about 21 years ago. She has never used smokeless tobacco. She reports that she does not drink alcohol or use drugs. Kristina Gomez is allergic to contrast media [iodinated diagnostic agents]; iodine; other; and sulfa antibiotics.   HPI  Today, she is being contacted for a post-procedure assessment.  Post-Procedure Evaluation  Procedure: Diagnostic right-sided cervical epidural steroid injection #1 under fluoroscopic guidance, no sedation Pre-procedure pain level:  10/10 Post-procedure: 7/10 Limited initial benefit, possibly due to rapid discharge after no sedation procedure, without enough time to allow full onset of block.  Sedation: None.  Effectiveness during initial hour after procedure(Ultra-Short Term  Relief):     Local anesthetic used: Long-acting (4-6 hours) Effectiveness: Defined as any analgesic benefit obtained secondary to the administration of local anesthetics. This carries significant diagnostic value as to the etiological location, or anatomical origin, of the pain. Duration of benefit is expected to coincide with the duration of the local anesthetic used.  Effectiveness during initial  4-6 hours after procedure(Short-Term Relief):     Long-term benefit: Defined as any relief past the pharmacologic duration of the local anesthetics.  Effectiveness past the initial 6 hours after procedure(Long-Term Relief):     Current benefits: Defined as benefit that persist at this time.   Analgesia:   ***  Function:  ***  ROM:  ***   Pharmacotherapy Assessment  Analgesic: Oxycodone IR 5, 1 tab PO q 6 hrs (20 mg/day of oxycodone) (has enough to last until 04/13/2019) MME/day:30 mg/day.   Monitoring: Pharmacotherapy: No side-effects or adverse reactions reported. [place] PMP: PDMP not reviewed this encounter.       Compliance: No problems identified. Effectiveness: Clinically acceptable. Plan: Refer to "POC".  UDS:  Summary  Date Value Ref Range Status  03/17/2018 FINAL  Final    Comment:    ==================================================================== TOXASSURE SELECT 13 (MW) ==================================================================== Test                             Result       Flag       Units Drug Present and Declared for Prescription Verification   Oxycodone                      350          EXPECTED   ng/mg creat   Oxymorphone                    1203         EXPECTED   ng/mg creat   Noroxycodone                   1320         EXPECTED   ng/mg creat   Noroxymorphone                 580          EXPECTED   ng/mg creat    Sources of oxycodone are scheduled prescription medications.    Oxymorphone, noroxycodone, and noroxymorphone are expected    metabolites of  oxycodone. Oxymorphone is also available as a    scheduled prescription medication. ==================================================================== Test                      Result    Flag   Units      Ref Range   Creatinine              30               mg/dL      >=20 ==================================================================== Declared Medications:  The flagging and interpretation on this report are based on the  following declared medications.  Unexpected results may arise from  inaccuracies in the declared medications.  **Note: The testing scope of this panel includes these medications:  Oxycodone  **Note: The testing scope of this panel does not include following  reported medications:  Acetaminophen (Tylenol)  Albuterol (Ipratropium-Albuterol)  Cholecalciferol  Cyclobenzaprine  Furosemide  Gabapentin  Ipratropium (Ipratropium-Albuterol)  Lisinopril  Meloxicam  Mirabegron  Multivitamin  Ofloxaxin  Pantoprazole (Protonix)  Potassium  Trazodone  Zolpidem ==================================================================== For clinical consultation, please call [phone]. ====================================================================    Laboratory Chemistry Profile (12 mo)  Renal: No results found for requested labs within last 8760 hours.  Lab Results  Component Value Date   GFRAA >60 07/23/2016   GFRNONAA >60 07/23/2016  Hepatic: 06/02/2018: Albumin 3.7 Lab Results  Component Value Date   AST 30 07/23/2016   ALT 43 07/23/2016   Other: No results found for requested labs within last 8760 hours. Note: Above Lab results reviewed.  Imaging  Last 90 days:  [facility] C-arm 1-60 Min No Report  Result Date: 02/09/2019 Fluoro was used, but no Radiologist interpretation will be provided. Please refer to "NOTES" tab for provider progress note.   Assessment  The primary encounter diagnosis was Cervicalgia (Bilateral) (R>L). Diagnoses of  Cervical spondylosis (Degenerative changes most prominent C4-5 and C5-6), Cervical radiculitis (Right), and DDD (degenerative disc disease), cervical were also pertinent to this visit.  Plan of Care  I am having Kristina Gomez maintain her furosemide, acetaminophen, mirabegron ER, potassium chloride, multivitamin, lisinopril-hydrochlorothiazide, famotidine, fluticasone, Symbicort, prazosin, hydroxypropyl methylcellulose / hypromellose, doxepin, ondansetron, calcium-vitamin D, senna-docusate, hydrOXYzine, calcitRIOL, oxybutynin, busPIRone, calcitRIOL, Os-Cal Calcium + D3, Dexilant, diazepam, ipratropium, famotidine, Combivent Respimat, levothyroxine, liothyronine, mirtazapine, pantoprazole, levothyroxine, citalopram, oxyCODONE, cyclobenzaprine, gabapentin, oxyCODONE, and oxyCODONE.  Pharmacotherapy (Medications Ordered): No orders of the defined types were placed in this encounter.  Orders:  No orders of the defined types were placed in this encounter.  Follow-up plan:   No follow-ups on file.      Considering:   Left L5-S1 transforaminalepidural steroid injection Right L4-5 transforaminalepidural steroid injection Left diagnostic cervical epiduralsteroid injection Diagnostic bilateral cervical facet block Possible bilateral cervical facet radiofrequencyablation Diagnostic bilateralintra-articular knee injection Diagnostic bilateral genicular nerve block Possible bilateral genicular nerve radiofrequencyablation Diagnostic bilateral lumbar facet block Possible bilateral lumbar facet radiofrequencyablation Diagnostic bilateral sacroiliac joint block Possible bilateral sacroiliac joint radiofrequencyablation Diagnostic bilateralintra-articular hip joint injection Possible bilateral hip radiofrequency ablation Diagnostic bilateralintra-articular shoulder joint injection Diagnostic bilateral suprascapular nerve block Possible bilateral suprascapular nerve radiofrequencyablation    Palliative PRN treatment(s):   Diagnostic/Theer[place]utic right CESI #2  Palliative left L4-5 LESI #3  Palliative left L5-S1 interlaminar LESI #2     Recent Visits Date Type Provider Dept  02/09/19 Procedure visit Milinda Pointer, MD [facility]  01/13/19 Office Visit Milinda Pointer, MD [facility]  12/23/18 Office Visit Milinda Pointer, MD [facility]  Showing recent visits within past 90 days and meeting all other requirements   Future Appointments Date Type Provider Dept  03/02/19 Appointment Milinda Pointer, MD [facility]  Showing future appointments within next 90 days and meeting all other requirements   I discussed the assessment and treatment plan with the patient. The patient was provided an opportunity to ask questions and all were answered. The patient agreed with the plan and demonstrated an understanding of the instructions.  Patient advised to call back or seek an in-person evaluation if the symptoms or condition worsens.  Total duration of non-face-to-face encounter: *** minutes.  Note by: Gaspar Cola, MD Date: 03/02/2019; Time: 5:17 PM  Note: This dictation was prepared with Dragon dictation. Any transcriptional errors that may result from this process are unintentional.  Disclaimer:  * Given the special circumstances of the COVID-19 pandemic, the federal government has announced that the Office for Civil Rights (OCR) will exercise its enforcement discretion and will not impose penalties on physicians using telehealth in the event of noncompliance with regulatory requirements under the [place] (HIPAA) in connection with the good faith provision of telehealth during the XX123456 national public health emergency. ([place])

## 2019-03-02 ENCOUNTER — Other Ambulatory Visit: Payer: Self-pay

## 2019-03-02 ENCOUNTER — Ambulatory Visit: Payer: Medicare Other | Admitting: Pain Medicine

## 2019-03-17 ENCOUNTER — Ambulatory Visit: Payer: Medicare Other | Admitting: Pain Medicine

## 2019-03-20 NOTE — Progress Notes (Signed)
Pain Management Virtual Encounter Note - Virtual Visit via Telephone Telehealth (real-time audio visits between healthcare provider and patient).   Patient's Phone No. & Preferred Pharmacy:  [phone] (home); [phone] (mobile); (Preferred) [phone] [email or web address]  [id number] - [place], [place] - [address] AT [facility] [address] [place] [address] Phone: [phone] Fax: [phone]    Pre-screening note:  Our staff contacted Kristina Gomez and offered her an "in person", "face-to-face" appointment versus a telephone encounter. She indicated preferring the telephone encounter, at this time.   Reason for Virtual Visit: COVID-19*  Social distancing based on CDC and AMA recommendations.   I contacted Kristina Gomez on 03/22/2019 via telephone.      I clearly identified myself as Gaspar Cola, MD. I verified that I was speaking with the correct person using two identifiers (Name: Kristina Gomez, and date of birth: Feb 18, 1966).  Advanced Informed Consent I sought verbal advanced consent from Kristina Gomez for virtual visit interactions. I informed Kristina Gomez of possible security and privacy concerns, risks, and limitations associated with providing "not-in-person" medical evaluation and management services. I also informed Kristina Gomez of the availability of "in-person" appointments. Finally, I informed her that there would be a charge for the virtual visit and that she could be  personally, fully or partially, financially responsible for it. Kristina Gomez expressed understanding and agreed to proceed.   Historic Elements   Kristina Gomez is a 53 y.o. year old, female patient evaluated today after her last encounter by our practice on 03/01/2019. Kristina Gomez  has a past medical history of Anxiety, Arthritis, Asthma, Chronic pain, Chronic pain syndrome (04/10/2015), CPAP (continuous positive airway pressure) dependence, Degenerative arthritis of hip  (04/10/2015), Depression, Diaphragmatic hernia (04/10/2015), Family history of adverse reaction to anesthesia, Food poisoning (09/27/2017), GERD (gastroesophageal reflux disease), Headache, Hearing loss, Hiatal hernia, Hypertension, Migraines, Neuromuscular disorder ([place]), Osteoporosis, Reflux, Shortness of breath dyspnea, Sleep apnea, and Vision loss. She also  has a past surgical history that includes Inner ear surgery (Bilateral); Dilation and curettage of uterus; Esophagogastroduodenoscopy (egd) with propofol (N/A, 05/08/2017); and Thyroidectomy (N/A, 05/28/2018). Kristina Gomez has a current medication list which includes the following prescription(s): acetaminophen, buspirone, calcitriol, citalopram, combivent respimat, cyclobenzaprine, dexilant, diazepam, doxepin, famotidine, fluticasone, gabapentin, hydroxypropyl methylcellulose / hypromellose, hydroxyzine, ipratropium, levothyroxine, lisinopril-hydrochlorothiazide, mirabegron er, mirtazapine, multivitamin, os-cal calcium + d3, oxybutynin, oxycodone, pantoprazole, potassium chloride, prazosin, calcitriol, calcium-vitamin d, diclofenac, famotidine, furosemide, liothyronine, ondansetron, oxycodone, senna-docusate, and symbicort. She  reports that she quit smoking about 21 years ago. She has never used smokeless tobacco. She reports that she does not drink alcohol or use drugs. Kristina Gomez is allergic to contrast media [iodinated diagnostic agents]; iodine; other; and sulfa antibiotics.   HPI  Today, she is being contacted for both, medication management and a post-procedure assessment.  The patient just moved permanently to [place].  He indicates that her sister has a pain management physician over there that she is scheduled to see in November.  However, she also indicates that she was unable to get her pain medication here, before she left, and she has been without any pain medication.  Today I will go ahead and attempt to send some prescriptions to maintain her  care until November when she will be seeing the new physician.  However, I have warned the patient that I am licensed to practice medicine only in [place] and therefore I will not be  taking care of her long-term, from a distance.  In fact, I have informed the patient that I am not entirely sure whether or not my prescriptions will be honored in [place].  I have informed her that she needs to make sure not to delay her transfer to another pain physician in that area since it does take time for them to evaluate her and accept her into the new practice.   The patient indicates doing well with the current medication regimen, except for the fact that she was unable to get her last prescription filled before leaving the state. No adverse reactions or side effects reported to the medications.  In addition, the patient indicates that the cervical epidural steroid injection did not help her shoulder and upper extremity pain.  She indicates that the pain is primarily in the area of the shoulder and when she attempts to raise her arm above her head, the shoulder hurts and this is not associated with her movement of the neck.  She indicates that the pain that she used to have going all the way into her hand is being gone, but she still has pain in the upper portion of the right arm, that does not quite go all the way down to the elbow.  Based on the description of her symptoms, it would seem to me that this particular pain that remains is not secondary to a cervical radiculitis, but her shoulder problems.  She does have a prior x-ray of the right shoulder that was done in 2017.  At that time she already had evidence of acromioclavicular degenerative disease, which is likely to be responsible for some of the pain that she is experiencing now.  She did have an injection done in her shoulder by her primary care physician, which did not help her pain, and she seems to think that this means that the pain is not coming  from the shoulder.  I do not necessarily agree with this since that injection was not likely to have been done in the [facility] joint or with fluoroscopic guidance, and therefore I really have doubts with regards to its diagnostic potential.  In any case, the patient needs to continue with her care in [place].  At this point, based on the things that we would have done next, I would recommend for her future physician to start by updating her last urine drug screening test, and also updating her last comprehensive metabolic panel more recent assessment of her kidney and liver function.  I would then follow this with an update on her shoulder x-rays and perhaps an MRI of the right shoulder, in combination with a properly done diagnostic right acromioclavicular joint injection and a diagnostic right glenohumeral joint injection under fluoroscopic guidance.  Good provide the patient with good relief of the pain, but no long-term benefit, then I would suggest a diagnostic suprascapular nerve block, which if effective, can be followed by radiofrequency ablation of the suprascapular nerve.  Post-Procedure Evaluation  Procedure: Diagnostic right CESI #1 under fluoroscopic guidance, no sedation Pre-procedure pain level:  10/10 Post-procedure: 7/10 No initial benefit, possibly due to rapid discharge after no sedation procedure, without enough time to allow full onset of block.  Sedation: None.  Effectiveness during initial hour after procedure(Ultra-Short Term Relief): 30 %   Local anesthetic used: Long-acting (4-6 hours) Effectiveness: Defined as any analgesic benefit obtained secondary to the administration of local anesthetics. This carries significant diagnostic value as to the etiological location, or anatomical  origin, of the pain. Duration of benefit is expected to coincide with the duration of the local anesthetic used.  Effectiveness during initial 4-6 hours after procedure(Short-Term Relief): 0 %   Long-term  benefit: Defined as any relief past the pharmacologic duration of the local anesthetics.  Effectiveness past the initial 6 hours after procedure(Long-Term Relief): 0 %   Current benefits: Defined as benefit that persist at this time.   Analgesia:  No relief of the shoulder or upper arm pain, but the patient does indicate 80 to 90% relief for cervicalgia and complete relief of the pain going all the way down into her hand.  This is likely to suggest that there radicular component to the patient's pain is now gone, but what remains is arthrogenic pain from her right shoulder joint.  (Likely to be the [facility] joint). Function: No benefit ROM: No benefit Note: Based on the results of this diagnostic injection, it is not likely that her current pain is cervicogenic in nature but more associated with her right shoulder.  Pharmacotherapy Assessment  Analgesic: Oxycodone IR 5, 1 tab PO q 6 hrs (20 mg/day of oxycodone) (has enough to last until 04/13/2019) MME/day:30 mg/day.   Monitoring: Pharmacotherapy: No side-effects or adverse reactions reported. [place] PMP: PDMP reviewed during this encounter.       Compliance: No problems identified. Effectiveness: Clinically acceptable. Plan: Refer to "POC".  UDS:  Summary  Date Value Ref Range Status  03/17/2018 FINAL  Final    Comment:    ==================================================================== TOXASSURE SELECT 13 (MW) ==================================================================== Test                             Result       Flag       Units Drug Present and Declared for Prescription Verification   Oxycodone                      350          EXPECTED   ng/mg creat   Oxymorphone                    1203         EXPECTED   ng/mg creat   Noroxycodone                   1320         EXPECTED   ng/mg creat   Noroxymorphone                 580          EXPECTED   ng/mg creat    Sources of oxycodone are scheduled prescription medications.     Oxymorphone, noroxycodone, and noroxymorphone are expected    metabolites of oxycodone. Oxymorphone is also available as a    scheduled prescription medication. ==================================================================== Test                      Result    Flag   Units      Ref Range   Creatinine              30               mg/dL      >=20 ==================================================================== Declared Medications:  The flagging and interpretation on this report are based on the  following declared medications.  Unexpected results may arise from  inaccuracies in the declared medications.  **Note: The testing scope of this panel includes these medications:  Oxycodone  **Note: The testing scope of this panel does not include following  reported medications:  Acetaminophen (Tylenol)  Albuterol (Ipratropium-Albuterol)  Cholecalciferol  Cyclobenzaprine  Furosemide  Gabapentin  Ipratropium (Ipratropium-Albuterol)  Lisinopril  Meloxicam  Mirabegron  Multivitamin  Ofloxaxin  Pantoprazole (Protonix)  Potassium  Trazodone  Zolpidem ==================================================================== For clinical consultation, please call [phone]. ====================================================================    Laboratory Chemistry Profile (12 mo)  Renal: No results found for requested labs within last 8760 hours.  Lab Results  Component Value Date   GFRAA >60 07/23/2016   GFRNONAA >60 07/23/2016   Hepatic: 06/02/2018: Albumin 3.7 Lab Results  Component Value Date   AST 30 07/23/2016   ALT 43 07/23/2016   Other: No results found for requested labs within last 8760 hours. Note: Above Lab results reviewed.  Imaging  Last 90 days:  [facility] C-arm 1-60 Min No Report  Result Date: 02/09/2019 Fluoro was used, but no Radiologist interpretation will be provided. Please refer to "NOTES" tab for provider progress note.   Assessment   The primary encounter diagnosis was Chronic pain syndrome. 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  Plan of Care  I am having Kristina Gomez maintain her furosemide, acetaminophen, mirabegron ER, potassium chloride, multivitamin, lisinopril-hydrochlorothiazide, famotidine, fluticasone, Symbicort, prazosin, hydroxypropyl methylcellulose / hypromellose, doxepin, ondansetron, calcium-vitamin D, senna-docusate, hydrOXYzine, calcitRIOL, oxybutynin, busPIRone, calcitRIOL, Os-Cal Calcium + D3, Dexilant, diazepam, ipratropium, famotidine, Combivent Respimat, levothyroxine, liothyronine, mirtazapine, pantoprazole, citalopram, diclofenac, oxyCODONE, oxyCODONE, cyclobenzaprine, and gabapentin.  Pharmacotherapy (Medications Ordered): Meds ordered this encounter  Medications  . oxyCODONE (OXY IR/ROXICODONE) 5 MG immediate release tablet    Sig: Take 1 tablet (5 mg total) by mouth every 6 (six) hours as needed for severe pain. Must last 30 days    Dispense:  120 tablet    Refill:  0    Chronic Pain: STOP Act (Not applicable) Fill 1 day early if closed on refill date. Do not fill until: 04/21/2019. To last until: 05/21/2019. Avoid benzodiazepines within 8 hours of opioids  . oxyCODONE (OXY IR/ROXICODONE) 5 MG immediate release tablet    Sig: Take 1 tablet (5 mg total) by mouth every 6 (six) hours as needed for severe pain. Must last 30 days    Dispense:  120 tablet    Refill:  0    Chronic Pain: STOP Act (Not  applicable) Fill 1 day early if closed on refill date. Do not fill until: 03/22/2019. To last until: 04/21/2019. Avoid benzodiazepines within 8 hours of opioids  . cyclobenzaprine (FLEXERIL) 10 MG tablet    Sig: Take 1 tablet (10 mg total) by mouth 3 (three) times daily as needed for muscle spasms.    Dispense:  90 tablet    Refill:  2    Fill one day early if pharmacy is closed on scheduled refill date. May substitute for generic if available.  . gabapentin (NEURONTIN) 400 MG capsule    Sig: Take 1 capsule (400 mg total) by mouth 4 (four) times daily.  Follow written titration schedule.    Dispense:  120 capsule    Refill:  2    Fill one day early if pharmacy is closed on scheduled refill date. May substitute for generic if available.   Orders:  No orders of the defined types were placed in this encounter.  Follow-up plan:   No follow-ups on file.  The patient has permanently moved to [place] and therefore we will transfer her care to her physician of choice.  No further appointments will be scheduled here.  Today I have sent a note refills on her medications, to keep her comfortable until she establishes her care with her new physician.  She has an appointment for November.  No further prescriptions will be issued after that.     Considering:   Left L5-S1 transforaminalepidural steroid injection Right L4-5 transforaminalepidural steroid injection Left diagnostic cervical epiduralsteroid injection Diagnostic bilateral cervical facet block Possible bilateral cervical facet radiofrequencyablation Diagnostic bilateralintra-articular knee injection Diagnostic bilateral genicular nerve block Possible bilateral genicular nerve radiofrequencyablation Diagnostic bilateral lumbar facet block Possible bilateral lumbar facet radiofrequencyablation Diagnostic bilateral sacroiliac joint block Possible bilateral sacroiliac joint radiofrequencyablation Diagnostic bilateralintra-articular  hip joint injection Possible bilateral hip radiofrequency ablation Diagnostic bilateralintra-articular shoulder joint injection Diagnostic bilateral suprascapular nerve block Possible bilateral suprascapular nerve radiofrequencyablation   Palliative PRN treatment(s):   Diagnostic/Theerapeutic right CESI #2  Palliative left L4-5 LESI #3  Palliative left L5-S1 interlaminar LESI #2      Recent Visits Date Type Provider Dept  02/09/19 Procedure visit Milinda Pointer, MD [facility]  01/13/19 Office Visit Milinda Pointer, MD [facility]  12/23/18 Office Visit Milinda Pointer, MD [facility]  Showing recent visits within past 90 days and meeting all other requirements   Today's Visits Date Type Provider Dept  03/22/19 Office Visit Milinda Pointer, MD [facility]  Showing today's visits and meeting all other requirements   Future Appointments No visits were found meeting these conditions.  Showing future appointments within next 90 days and meeting all other requirements   I discussed the assessment and treatment plan with the patient. The patient was provided an opportunity to ask questions and all were answered. The patient agreed with the plan and demonstrated an understanding of the instructions.  Patient advised to call back or seek an in-person evaluation if the symptoms or condition worsens.  Total duration of non-face-to-face encounter: 15 minutes.  Note by: Gaspar Cola, MD Date: 03/22/2019; Time: 9:53 AM  Note: This dictation was prepared with Dragon dictation. Any transcriptional errors that may result from this process are unintentional.  Disclaimer:  * Given the special circumstances of the COVID-19 pandemic, the federal government has announced that the Office for Civil Rights (OCR) will exercise its enforcement discretion and will not impose penalties on physicians using telehealth in the event of  noncompliance with regulatory requirements under the [place] (HIPAA) in connection with the good faith provision of telehealth during the XX123456 national public health emergency. ([place])

## 2019-03-22 ENCOUNTER — Other Ambulatory Visit: Payer: Self-pay

## 2019-03-22 ENCOUNTER — Ambulatory Visit: Payer: Medicare Other | Attending: Pain Medicine | Admitting: Pain Medicine

## 2019-03-22 DIAGNOSIS — G8929 Other chronic pain: Secondary | ICD-10-CM

## 2019-03-22 DIAGNOSIS — M79605 Pain in left leg: Secondary | ICD-10-CM

## 2019-03-22 DIAGNOSIS — M7918 Myalgia, other site: Secondary | ICD-10-CM

## 2019-03-22 DIAGNOSIS — M5412 Radiculopathy, cervical region: Secondary | ICD-10-CM

## 2019-03-22 DIAGNOSIS — M5442 Lumbago with sciatica, left side: Secondary | ICD-10-CM

## 2019-03-22 DIAGNOSIS — R252 Cramp and spasm: Secondary | ICD-10-CM

## 2019-03-22 DIAGNOSIS — M79604 Pain in right leg: Secondary | ICD-10-CM | POA: Diagnosis not present

## 2019-03-22 DIAGNOSIS — M792 Neuralgia and neuritis, unspecified: Secondary | ICD-10-CM

## 2019-03-22 DIAGNOSIS — M542 Cervicalgia: Secondary | ICD-10-CM

## 2019-03-22 DIAGNOSIS — M47812 Spondylosis without myelopathy or radiculopathy, cervical region: Secondary | ICD-10-CM

## 2019-03-22 DIAGNOSIS — G894 Chronic pain syndrome: Secondary | ICD-10-CM

## 2019-03-22 DIAGNOSIS — Z79899 Other long term (current) drug therapy: Secondary | ICD-10-CM

## 2019-03-22 DIAGNOSIS — M5441 Lumbago with sciatica, right side: Secondary | ICD-10-CM

## 2019-03-22 DIAGNOSIS — M25552 Pain in left hip: Secondary | ICD-10-CM

## 2019-03-22 MED ORDER — GABAPENTIN 400 MG PO CAPS: 400.0000 mg | ORAL_CAPSULE | Freq: Four times a day (QID) | ORAL | 2 refills | Status: AC

## 2019-03-22 MED ORDER — OXYCODONE HCL 5 MG PO TABS: 5.0000 mg | ORAL_TABLET | Freq: Four times a day (QID) | ORAL | 0 refills | Status: AC | PRN

## 2019-03-22 MED ORDER — CYCLOBENZAPRINE HCL 10 MG PO TABS: 10.0000 mg | ORAL_TABLET | Freq: Three times a day (TID) | ORAL | 2 refills | Status: AC | PRN
